# Patient Record
Sex: Female | Born: 1955 | Race: White | Hispanic: No | Marital: Married | Smoking: Never smoker
Health system: Southern US, Community
[De-identification: ages and names within clinical notes are randomized; demographics above are authoritative.]

## PROBLEM LIST (undated history)

## (undated) DIAGNOSIS — B009 Herpesviral infection, unspecified: Secondary | ICD-10-CM

## (undated) DIAGNOSIS — T7840XA Allergy, unspecified, initial encounter: Secondary | ICD-10-CM

## (undated) DIAGNOSIS — M797 Fibromyalgia: Secondary | ICD-10-CM

## (undated) DIAGNOSIS — E079 Disorder of thyroid, unspecified: Secondary | ICD-10-CM

## (undated) DIAGNOSIS — R569 Unspecified convulsions: Secondary | ICD-10-CM

## (undated) HISTORY — PX: OOPHORECTOMY: SHX86

## (undated) HISTORY — PX: APPENDECTOMY: SHX54

## (undated) HISTORY — DX: Herpesviral infection, unspecified: B00.9

## (undated) HISTORY — DX: Allergy, unspecified, initial encounter: T78.40XA

## (undated) HISTORY — PX: DILATION AND CURETTAGE OF UTERUS: SHX78

## (undated) HISTORY — DX: Fibromyalgia: M79.7

---

## 2004-09-08 ENCOUNTER — Emergency Department (HOSPITAL_COMMUNITY): Admission: EM | Admit: 2004-09-08 | Discharge: 2004-09-08 | Payer: Self-pay | Admitting: Emergency Medicine

## 2009-09-04 ENCOUNTER — Ambulatory Visit: Payer: Self-pay | Admitting: Family Medicine

## 2009-09-04 DIAGNOSIS — R569 Unspecified convulsions: Secondary | ICD-10-CM | POA: Insufficient documentation

## 2009-09-04 DIAGNOSIS — E039 Hypothyroidism, unspecified: Secondary | ICD-10-CM | POA: Insufficient documentation

## 2009-09-04 DIAGNOSIS — J069 Acute upper respiratory infection, unspecified: Secondary | ICD-10-CM | POA: Insufficient documentation

## 2009-09-07 ENCOUNTER — Telehealth: Payer: Self-pay | Admitting: Family Medicine

## 2010-08-02 NOTE — Progress Notes (Signed)
  Phone Note Call from Patient   Caller: Patient Reason for Call: Lab or Test Results Summary of Call: Pt cllng for strep results. Pt can be reached at [phone]. Initial call taken by: Areta Haber CMA,  September 07, 2009 4:50 PM    Please notify patient that throat culture was negative. Donna Christen MD  September 07, 2009 5:34 PM   Notified of result Donna Christen MD  September 07, 2009 8:21 PM

## 2010-08-02 NOTE — Assessment & Plan Note (Signed)
Summary: cough/runny nose, sinus pressure x 2 dys rm 3   Vital Signs:  Patient Profile:   55 Years Old Female CC:      Cold & URI symptoms Height:     60 inches Weight:      135 pounds O2 Sat:      98 % O2 treatment:    Room Air Temp:     97.8 degrees F oral Pulse rate:   64 / minute Pulse rhythm:   regular Resp:     16 per minute BP sitting:   116 / 73  (right arm) Cuff size:   regular  Vitals Entered By: Areta Haber CMA (September 04, 2009 2:34 PM)                  Current Allergies: No known allergies History of Present Illness Chief Complaint: Cold & URI symptoms History of Present Illness: Subjective: Patient complains of URI symptoms for 3 days with initial sore throat that is persisting, sinus congestion, and cough. Cough is worse at night No pleuritic pain No wheezing No post-nasal drainage No sinus pain/pressure No itchy/red eyes No earache No hemoptysis No SOB No fever/chills No nausea No vomiting No abdominal pain No diarrhea No skin rashes + fatigue No myalgias No headache Used OTC meds without relief  Current Problems: URI (ICD-465.9) HYPOTHYROIDISM (ICD-244.9) SEIZURE DISORDER (ICD-780.39)   Current Meds LAMICTAL 150 MG TABS (LAMOTRIGINE) 1 tab by mouth once daily LEVOTHYROXINE SODIUM 137 MCG TABS (LEVOTHYROXINE SODIUM) 1 tab by mouth once daily AMOXICILLIN 875 MG TABS (AMOXICILLIN) One by mouth two times a day  REVIEW OF SYSTEMS Constitutional Symptoms      Denies fever, chills, night sweats, weight loss, weight gain, and fatigue.  Eyes       Denies change in vision, eye pain, eye discharge, glasses, contact lenses, and eye surgery. Ear/Nose/Throat/Mouth       Complains of frequent runny nose, sinus problems, and sore throat.      Denies hearing loss/aids, change in hearing, ear pain, ear discharge, dizziness, frequent nose bleeds, hoarseness, and tooth pain or bleeding.      Comments: x yellowish x 2 dys Respiratory  Complains of dry cough.      Denies productive cough, wheezing, shortness of breath, asthma, bronchitis, and emphysema/COPD.  Cardiovascular       Denies murmurs, chest pain, and tires easily with exhertion.    Gastrointestinal       Denies stomach pain, nausea/vomiting, diarrhea, constipation, blood in bowel movements, and indigestion. Genitourniary       Denies painful urination, kidney stones, and loss of urinary control. Neurological       Denies paralysis, seizures, and fainting/blackouts. Musculoskeletal       Denies muscle pain, joint pain, joint stiffness, decreased range of motion, redness, swelling, muscle weakness, and gout.  Skin       Denies bruising, unusual mles/lumps or sores, and hair/skin or nail changes.  Psych       Denies mood changes, temper/anger issues, anxiety/stress, speech problems, depression, and sleep problems.  Past History:  Past Medical History: Seizure disorder Hypothyroidism  Past Surgical History: Ovary surgery Appendectomy Colectomy   Objective:  Appearance:  Patient appears healthy, stated age, and in no acute distress ] Eyes:  Pupils are equal, round, and reactive to light and accomdation.  Extraocular movement is intact.  Conjunctivae are not inflamed.  Ears:  Canals normal.  Tympanic membranes normal.   Nose:  Normal septum.  Normal turbinates, mildly congested.    No sinus tenderness present.  Pharynx:  Mild erythema Neck:  Supple.  No adenopathy is present.  No thyromegaly is present  Lungs:  Clear to auscultation.  Breath sounds are equal.  Heart:  Regular rate and rhythm without murmurs, rubs, or gallops.  Abdomen:  Nontender without masses or hepatosplenomegaly.  Bowel sounds are present.  No CVA or flank tenderness.  Rapid strep test negative  Assessment New Problems: URI (ICD-465.9) HYPOTHYROIDISM (ICD-244.9) SEIZURE DISORDER (ICD-780.39)  VIRAL URI  Plan New Medications/Changes: AMOXICILLIN 875 MG TABS (AMOXICILLIN)  One by mouth two times a day  #20 x 0, 09/04/2009, Donna Christen MD  New Orders: T-Culture, Throat [[id number]] Rapid Strep [02725] New Patient Level III [36644] Planning Comments:   Throat culture pending.  If positive, begin amoxicillin. Treat symptomatically for now:  expectorant, increased fluids, rest.  Add amoxicillin if fever persists 5 to 7 days.   The patient and/or caregiver has been counseled thoroughly with regard to medications prescribed including dosage, schedule, interactions, rationale for use, and possible side effects and they verbalize understanding.  Diagnoses and expected course of recovery discussed and will return if not improved as expected or if the condition worsens. Patient and/or caregiver verbalized understanding.  Prescriptions: AMOXICILLIN 875 MG TABS (AMOXICILLIN) One by mouth two times a day  #20 x 0   Entered and Authorized by:   Donna Christen MD   Signed by:   Donna Christen MD on 09/04/2009   Method used:   Print then Give to Patient   RxID:   [id number]   Laboratory Results  Date/Time Received: September 04, 2009 3:00 PM  Date/Time Reported: September 04, 2009 3:00 PM   Other Tests  Rapid Strep: negative  Kit Test Internal QC: Negative   (Normal Range: Negative)

## 2011-06-26 ENCOUNTER — Encounter: Payer: Self-pay | Admitting: *Deleted

## 2011-06-26 ENCOUNTER — Emergency Department (INDEPENDENT_AMBULATORY_CARE_PROVIDER_SITE_OTHER)
Admission: EM | Admit: 2011-06-26 | Discharge: 2011-06-26 | Disposition: A | Discharge status: Home / Self Care | Payer: Medicare Other | Source: Home / Self Care | Attending: Emergency Medicine | Admitting: Emergency Medicine

## 2011-06-26 DIAGNOSIS — J069 Acute upper respiratory infection, unspecified: Secondary | ICD-10-CM

## 2011-06-26 DIAGNOSIS — J029 Acute pharyngitis, unspecified: Secondary | ICD-10-CM

## 2011-06-26 HISTORY — DX: Disorder of thyroid, unspecified: E07.9

## 2011-06-26 HISTORY — DX: Unspecified convulsions: R56.9

## 2011-06-26 LAB — POCT RAPID STREP A (OFFICE): Rapid Strep A Screen: NEGATIVE

## 2011-06-26 MED ORDER — AMOXICILLIN 500 MG PO CAPS: 500.0000 mg | ORAL_CAPSULE | Freq: Three times a day (TID) | ORAL | Status: AC

## 2011-06-26 MED ORDER — CETIRIZINE HCL 10 MG PO TABS: 10.0000 mg | ORAL_TABLET | Freq: Every day | ORAL | Status: DC

## 2011-06-26 NOTE — ED Notes (Signed)
Patient c/o sore throat, dry cough and drainage x 3 days. Denies fever.

## 2011-06-27 NOTE — ED Provider Notes (Signed)
History     CSN: [id number]  Arrival date & time 06/26/11  1191   First MD Initiated Contact with Patient 06/26/11 [phone]      Chief Complaint  Patient presents with  . Sore Throat     HPI SORE THROAT  Onset: 2-3 days    Severity: moderate Tried OTC meds without significant relief.  Symptoms:  + Fever  + Swollen neck glands    + mild nonproductive Cough with clear rhinorrhea. No Myalgias No Headache No Rash     No Recent Strep Exposure No Abdominal Pain No reflux sxs No Allergy sxs  No Breathing difficulty No Drooling No Trismus   Past Medical History  Diagnosis Date  . Seizures   . Thyroid disease     Past Surgical History  Procedure Date  . Dilation and curettage of uterus     History reviewed. No pertinent family history.  History  Substance Use Topics  . Smoking status: Never Smoker   . Smokeless tobacco: Not on file  . Alcohol Use: No    OB History    Grav Para Term Preterm Abortions TAB SAB Ect Mult Living                  Review of Systems  Constitutional: Positive for fatigue.  Respiratory: Negative for chest tightness, shortness of breath and wheezing.   Cardiovascular: Negative.   Gastrointestinal: Negative.   Skin: Negative.   Hematological: Negative.   Psychiatric/Behavioral: Negative.     Allergies  Corn-containing products and Wheat  Home Medications   Current Outpatient Rx  Name Route Sig Dispense Refill  . ALPRAZOLAM 0.25 MG PO TABS Oral Take 0.25 mg by mouth at bedtime as needed.      [name] LAMOTRIGINE 100 MG PO TABS Oral Take 100 mg by mouth daily.      [name] LEVOTHYROXINE SODIUM 100 MCG PO TABS Oral Take 100 mcg by mouth daily.      . AMOXICILLIN 500 MG PO CAPS Oral Take 1 capsule (500 mg total) by mouth 3 (three) times daily. Take for 10 days. 30 capsule 0  . CETIRIZINE HCL 10 MG PO TABS Oral Take 1 tablet (10 mg total) by mouth daily. 15 tablet 0    BP 127/82  Pulse 53  Temp(Src) 97.7 F (36.5 C) (Oral)  Resp 14   Ht 5' (1.524 m)  Wt 126 lb (57.153 kg)  BMI 24.61 kg/m2  SpO2 100%  Physical Exam  Nursing note and vitals reviewed. Constitutional: She is oriented to person, place, and time. She appears well-developed and well-nourished. She is cooperative.  Non-toxic appearance. No distress.  HENT:  Head: Normocephalic and atraumatic.  Right Ear: Tympanic membrane, external ear and ear canal normal.  Left Ear: Tympanic membrane, external ear and ear canal normal.  Nose: Rhinorrhea (mild clearish serous mucus) present. No epistaxis. Right sinus exhibits no maxillary sinus tenderness and no frontal sinus tenderness. Left sinus exhibits no maxillary sinus tenderness and no frontal sinus tenderness.  Mouth/Throat: Mucous membranes are normal. Posterior oropharyngeal edema and posterior oropharyngeal erythema present. No oropharyngeal exudate.  Eyes: Conjunctivae are normal. No scleral icterus.  Neck: Neck supple.  Cardiovascular: Normal rate, regular rhythm and normal heart sounds.   No murmur heard. Pulmonary/Chest: Effort normal and breath sounds normal. No stridor. No respiratory distress. She has no wheezes. She has no rales.  Musculoskeletal: She exhibits no edema.  Lymphadenopathy:    She has cervical adenopathy.  Right cervical: Superficial cervical adenopathy present. No deep cervical and no posterior cervical adenopathy present.      Left cervical: Superficial cervical adenopathy present. No deep cervical and no posterior cervical adenopathy present.  Neurological: She is alert and oriented to person, place, and time.  Skin: Skin is warm and dry.  Psychiatric: She has a normal mood and affect.    ED Course  Procedures (including critical care time)   Labs Reviewed  POCT RAPID STREP A (OFFICE)   No results found.   1. Pharyngitis   2. URI (upper respiratory infection)       MDM  Rapid strep test negative. I explained the patient that she most likely has viral pharyngitis.  Symptomatic care discussed. Zyrtec for sinus drainage. If no better in two days, or if worsening bacterial symptoms, start taking the amoxicillin as prescribed. Options discussed. Risks, benefits, alternatives discussed. Patient voiced understanding and agreement.        Lonell Face, MD 06/27/11 [phone]

## 2015-05-05 ENCOUNTER — Ambulatory Visit (INDEPENDENT_AMBULATORY_CARE_PROVIDER_SITE_OTHER): Payer: Commercial Managed Care - HMO | Admitting: Physician Assistant

## 2015-05-05 ENCOUNTER — Encounter: Payer: Self-pay | Admitting: Physician Assistant

## 2015-05-05 VITALS — BP 123/69 | HR 90 | Ht 60.0 in | Wt 137.0 lb

## 2015-05-05 DIAGNOSIS — Z79899 Other long term (current) drug therapy: Secondary | ICD-10-CM

## 2015-05-05 DIAGNOSIS — G8929 Other chronic pain: Secondary | ICD-10-CM | POA: Insufficient documentation

## 2015-05-05 DIAGNOSIS — M159 Polyosteoarthritis, unspecified: Secondary | ICD-10-CM

## 2015-05-05 DIAGNOSIS — Z1159 Encounter for screening for other viral diseases: Secondary | ICD-10-CM

## 2015-05-05 DIAGNOSIS — M8949 Other hypertrophic osteoarthropathy, multiple sites: Secondary | ICD-10-CM

## 2015-05-05 DIAGNOSIS — E039 Hypothyroidism, unspecified: Secondary | ICD-10-CM | POA: Insufficient documentation

## 2015-05-05 DIAGNOSIS — M797 Fibromyalgia: Secondary | ICD-10-CM | POA: Insufficient documentation

## 2015-05-05 DIAGNOSIS — M150 Primary generalized (osteo)arthritis: Secondary | ICD-10-CM | POA: Diagnosis not present

## 2015-05-05 DIAGNOSIS — M542 Cervicalgia: Secondary | ICD-10-CM

## 2015-05-05 LAB — TSH: TSH: 2.031 u[IU]/mL (ref 0.350–4.500)

## 2015-05-05 LAB — COMPLETE METABOLIC PANEL WITH GFR
ALT: 34 U/L — ABNORMAL HIGH (ref 6–29)
AST: 34 U/L (ref 10–35)
Albumin: 4.4 g/dL (ref 3.6–5.1)
Alkaline Phosphatase: 71 U/L (ref 33–130)
BUN: 18 mg/dL (ref 7–25)
CHLORIDE: 102 mmol/L (ref 98–110)
CO2: 26 mmol/L (ref 20–31)
Calcium: 9.3 mg/dL (ref 8.6–10.4)
Creat: 1.02 mg/dL (ref 0.50–1.05)
GFR, EST NON AFRICAN AMERICAN: 61 mL/min (ref >=60)
GFR, Est African American: 71 mL/min (ref >=60)
GLUCOSE: 82 mg/dL (ref 65–99)
POTASSIUM: 4.2 mmol/L (ref 3.5–5.3)
SODIUM: 138 mmol/L (ref 135–146)
TOTAL PROTEIN: 6.6 g/dL (ref 6.1–8.1)
Total Bilirubin: 0.5 mg/dL (ref 0.2–1.2)

## 2015-05-05 MED ORDER — LAMOTRIGINE 150 MG PO TABS: 150.0000 mg | ORAL_TABLET | Freq: Every day | ORAL | Status: DC

## 2015-05-05 NOTE — Progress Notes (Signed)
   Subjective:    Patient ID: Judy Russell, female    DOB: 1955-12-13, 59 y.o.   MRN: [id number]  HPI Pt is a 59 yo female who presents to the clinic to establish care.   She needs refills on medication today.   She also needs referral for chiropractor that she sees now. This has improved fibromyalgia and OA as well as chronic pain.   Pt needs hep C screening. Her husband has hep C. Denies any IV drug use, tattoos or sharing needs. She is sexually active with husband.   .. Active Ambulatory Problems    Diagnosis Date Noted  . HYPOTHYROIDISM 09/04/2009  . URI 09/04/2009  . SEIZURE DISORDER 09/04/2009  . Fibromyalgia 05/05/2015   Resolved Ambulatory Problems    Diagnosis Date Noted  . No Resolved Ambulatory Problems   Past Medical History  Diagnosis Date  . Seizures (HCC)   . Thyroid disease   . Allergy   . HSV-2 (herpes simplex virus 2) infection    .[name]. Social History   Social History  . Marital Status: Single    Spouse Name: N/A  . Number of Children: N/A  . Years of Education: N/A   Occupational History  . Not on file.   Social History Main Topics  . Smoking status: Never Smoker   . Smokeless tobacco: Not on file  . Alcohol Use: No  . Drug Use: No  . Sexual Activity: Not Currently   Other Topics Concern  . Not on file   Social History Narrative   .[name]. Family History  Problem Relation Age of Onset  . Cancer Mother     breast  . Diabetes Mother   . Alcohol abuse Father   . Hypertension Father   . Stroke Father      Review of Systems  All other systems reviewed and are negative.      Objective:   Physical Exam  Constitutional: She is oriented to person, place, and time. She appears well-developed and well-nourished.  HENT:  Head: Normocephalic and atraumatic.  Cardiovascular: Normal rate, regular rhythm and normal heart sounds.   Pulmonary/Chest: Effort normal and breath sounds normal.  Neurological: She is alert and oriented to person, place,  and time.  Psychiatric: She has a normal mood and affect. Her behavior is normal.          Assessment & Plan:  Hypothyroidism- recheck TSH and adjust medications accordingly.   Seizure disorder- controlled. Refilled lamictal for 6 months. CMP ordered.   Fibromyalgia/chronic neck pain/OA of multiple joints- referral made for chiropractor. Discussed tumeric.pt declines any medications to try.  Follow up as needed.    Pt declines flu, tdap, mammogram, HIV and colonoscopy. Pt aware of risk assoicated with not getting screenings.  Hep C screening ordered. Since husband has Hep C and qualifying age range.   Will consider lipid level at CPE.

## 2015-05-06 ENCOUNTER — Ambulatory Visit: Payer: Self-pay | Admitting: Osteopathic Medicine

## 2015-05-06 LAB — HEPATITIS C ANTIBODY: HCV AB: NEGATIVE

## 2015-05-07 ENCOUNTER — Other Ambulatory Visit: Payer: Self-pay | Admitting: *Deleted

## 2015-05-07 MED ORDER — LEVOTHYROXINE SODIUM 100 MCG PO TABS: 100.0000 ug | ORAL_TABLET | Freq: Every day | ORAL | Status: DC

## 2015-07-07 ENCOUNTER — Encounter: Payer: Self-pay | Admitting: Physician Assistant

## 2015-07-07 ENCOUNTER — Ambulatory Visit (INDEPENDENT_AMBULATORY_CARE_PROVIDER_SITE_OTHER): Payer: Commercial Managed Care - HMO | Admitting: Physician Assistant

## 2015-07-07 VITALS — BP 147/58 | HR 58

## 2015-07-07 DIAGNOSIS — A609 Anogenital herpesviral infection, unspecified: Secondary | ICD-10-CM | POA: Diagnosis not present

## 2015-07-07 DIAGNOSIS — A6009 Herpesviral infection of other urogenital tract: Secondary | ICD-10-CM

## 2015-07-07 DIAGNOSIS — N941 Unspecified dyspareunia: Secondary | ICD-10-CM | POA: Diagnosis not present

## 2015-07-07 DIAGNOSIS — R300 Dysuria: Secondary | ICD-10-CM

## 2015-07-07 DIAGNOSIS — L298 Other pruritus: Secondary | ICD-10-CM | POA: Diagnosis not present

## 2015-07-07 DIAGNOSIS — N898 Other specified noninflammatory disorders of vagina: Secondary | ICD-10-CM

## 2015-07-07 LAB — POCT URINALYSIS DIPSTICK
BILIRUBIN UA: NEGATIVE
Glucose, UA: NEGATIVE
KETONES UA: NEGATIVE
Nitrite, UA: NEGATIVE
PH UA: 6.5
Protein, UA: NEGATIVE
Urobilinogen, UA: 0.2

## 2015-07-07 LAB — WET PREP FOR TRICH, YEAST, CLUE
Clue Cells Wet Prep HPF POC: NONE SEEN
Trich, Wet Prep: NONE SEEN
Yeast Wet Prep HPF POC: NONE SEEN

## 2015-07-07 MED ORDER — LEVOTHYROXINE SODIUM 125 MCG PO TABS: 125.0000 ug | ORAL_TABLET | Freq: Every day | ORAL | Status: DC

## 2015-07-07 MED ORDER — ACYCLOVIR 400 MG PO TABS: 400.0000 mg | ORAL_TABLET | Freq: Three times a day (TID) | ORAL | Status: DC

## 2015-07-07 NOTE — Progress Notes (Signed)
   Subjective:    Patient ID: Judy Russell, female    DOB: 30-Jul-1955, 60 y.o.   MRN: [id number]  HPI  Pt is a 60 yo female who presents to the clinic with dysuria, vaginal discharge worsening for last 3 days. She reports she always has burning in some capacity just has been worse recently. Discharge often has an odor. Not tried anything to make better. She has hx of herpes and concerned it could be outbreak. Valtrex makes Judy Russell feel sick and have headache. She does not want to take this for herpes. No fever, chills, abdominal pain, flank pain. She does report sex continues to be painful.    Review of Systems  All other systems reviewed and are negative.      Objective:   Physical Exam  Constitutional: She is oriented to person, place, and time. She appears well-developed and well-nourished.  HENT:  Head: Normocephalic and atraumatic.  Cardiovascular: Normal rate, regular rhythm and normal heart sounds.   Pulmonary/Chest: Effort normal and breath sounds normal.  Abdominal: Soft. Bowel sounds are normal. She exhibits no distension and no mass. There is no tenderness. There is no rebound and no guarding.  Genitourinary:  Erythematous vaginal canal with cluster of 3 ulcerations in superior vaginal canal. Pain with insertion of speculum.  No discharge seen on exam.   Neurological: She is alert and oriented to person, place, and time.  Psychiatric: She has a normal mood and affect. Judy Russell behavior is normal.          Assessment & Plan:  Dysuria/herpes genital- saw herpetic lesions on exam today. Treated with acyclovir for 5 days.   .. Results for orders placed or performed in visit on 07/07/15  POCT Urinalysis Dipstick  Result Value Ref Range   Color, UA yellow    Clarity, UA clear    Glucose, UA negative    Bilirubin, UA negative    Ketones, UA negative    Spec Grav, UA <=1.005    Blood, UA trace-lysed    pH, UA 6.5    Protein, UA negative    Urobilinogen, UA 0.2    Nitrite, UA  negative    Leukocytes, UA moderate (2+) (A) Negative   This could represent infection or herpes. Will culture and wait for results to come back to see if need to add abx.   Dyspareunia- discussed appears to also be some vaginal changes due to menopause. Vaginal estrogen could help this. Will consider once get all results in. Can consider astroglide as well as coconut oil.    Vaginal itching- stat wet prep done today.

## 2015-07-08 ENCOUNTER — Telehealth: Payer: Self-pay | Admitting: *Deleted

## 2015-07-08 NOTE — Telephone Encounter (Signed)
Pt called asking for test results from visit with Jade, I gave her the results from the wet prep but said we are still waiting on the urine culture results.  We will call her once those are in.

## 2015-07-09 LAB — URINE CULTURE: Colony Count: >=100000

## 2015-07-09 MED ORDER — AMOXICILLIN 875 MG PO TABS: 875.0000 mg | ORAL_TABLET | Freq: Two times a day (BID) | ORAL | Status: DC

## 2015-07-09 NOTE — Addendum Note (Signed)
Addended by: Jomarie LongsBREEBACK, Arriyana Rodell L on: 07/09/2015 07:58 AM   Modules accepted: Orders

## 2015-07-19 ENCOUNTER — Ambulatory Visit (INDEPENDENT_AMBULATORY_CARE_PROVIDER_SITE_OTHER): Payer: Commercial Managed Care - HMO | Admitting: Physician Assistant

## 2015-07-19 ENCOUNTER — Encounter: Payer: Self-pay | Admitting: Physician Assistant

## 2015-07-19 VITALS — BP 128/72 | HR 58 | Ht 60.0 in | Wt 138.0 lb

## 2015-07-19 DIAGNOSIS — R300 Dysuria: Secondary | ICD-10-CM | POA: Diagnosis not present

## 2015-07-19 DIAGNOSIS — N941 Unspecified dyspareunia: Secondary | ICD-10-CM | POA: Diagnosis not present

## 2015-07-19 LAB — POCT URINALYSIS DIPSTICK
Bilirubin, UA: NEGATIVE
Blood, UA: NEGATIVE
Glucose, UA: NEGATIVE
Ketones, UA: NEGATIVE
NITRITE UA: NEGATIVE
PH UA: 5.5
PROTEIN UA: NEGATIVE
Spec Grav, UA: >=1.03
Urobilinogen, UA: 0.2

## 2015-07-19 MED ORDER — ESTRADIOL 0.1 MG/GM VA CREA: 1.0000 | TOPICAL_CREAM | Freq: Every day | VAGINAL | Status: DC

## 2015-07-20 ENCOUNTER — Other Ambulatory Visit: Payer: Self-pay | Admitting: Physician Assistant

## 2015-07-20 ENCOUNTER — Encounter: Payer: Self-pay | Admitting: Physician Assistant

## 2015-07-20 ENCOUNTER — Telehealth: Payer: Self-pay

## 2015-07-20 MED ORDER — ESTROGENS, CONJUGATED 0.625 MG/GM VA CREA: 1.0000 | TOPICAL_CREAM | Freq: Every day | VAGINAL | Status: DC

## 2015-07-20 NOTE — Telephone Encounter (Signed)
The cost of the estrace cream is too expensive and she would like a cheaper cream. Please advise.

## 2015-07-20 NOTE — Progress Notes (Signed)
   Subjective:    Patient ID: Judy Russell, female    DOB: 1956-01-16, 60 y.o.   MRN: [id number]  HPI  patient is a 60 year old female who presents to the clinic if she has started to feel some warmth burning with urination. She was previously diagnosed with an herpetic exacerbation and treated with acyclovir. She also had group the strap on her urine culture. She was treated with amoxicillin. She has finished both those treatments. She did feel better at one point but now the burning has started back. She denies any abdominal pain or back pain. She denies any fever, nausea or vomiting. She denies any diarrhea.   Review of Systems  All other systems reviewed and are negative.      Objective:   Physical Exam  Constitutional: She is oriented to person, place, and time. She appears well-developed and well-nourished.  HENT:  Head: Normocephalic and atraumatic.  Cardiovascular: Normal rate and normal heart sounds.   Pulmonary/Chest: Effort normal and breath sounds normal.  Negative for CVA tenderness.   Abdominal: Soft. Bowel sounds are normal. She exhibits no distension. There is no tenderness.  Genitourinary:  No vaginal discharge or herpetic lesions. Vagina mucosa was frail and erythematous.   Neurological: She is alert and oriented to person, place, and time.  Psychiatric: She has a normal mood and affect. Her behavior is normal.          Assessment & Plan:  Dysuria- .. Results for orders placed or performed in visit on 07/19/15  POCT urinalysis dipstick  Result Value Ref Range   Color, UA yellow    Clarity, UA clear    Glucose, UA neg    Bilirubin, UA neg    Ketones, UA neg    Spec Grav, UA >=1.030    Blood, UA neg    pH, UA 5.5    Protein, UA neg    Urobilinogen, UA 0.2    Nitrite, UA neg    Leukocytes, UA small (1+) (A) Negative   UA was positive for leukocytes but no other pos findings. wil culture. Did not repeat wet prep since no discharge seen and last wet prep  was negative.  Reassured no herpetic lesions.  I think "burning" could be coming from menopausal changes to vaginal mucosa. Sent estrace once daily for 2 weeks then 3 times a week. Will call with any treatment changes.

## 2015-07-20 NOTE — Telephone Encounter (Signed)
Left message advising of recommendations.  

## 2015-07-20 NOTE — Telephone Encounter (Signed)
I sent premarin to try same directions. One application daily for 2 weeks then switch to 3 times a week.

## 2015-07-21 ENCOUNTER — Other Ambulatory Visit: Payer: Self-pay | Admitting: *Deleted

## 2015-07-21 LAB — URINE CULTURE: Colony Count: 25000

## 2015-11-02 ENCOUNTER — Encounter (INDEPENDENT_AMBULATORY_CARE_PROVIDER_SITE_OTHER): Payer: Medicare HMO | Admitting: Physician Assistant

## 2015-11-02 NOTE — Progress Notes (Signed)
This encounter was created in error - please disregard.  NO SHOW

## 2016-01-13 ENCOUNTER — Other Ambulatory Visit: Payer: Self-pay

## 2016-01-13 MED ORDER — LEVOTHYROXINE SODIUM 125 MCG PO TABS: 125.0000 ug | ORAL_TABLET | Freq: Every day | ORAL | Status: DC

## 2016-02-28 ENCOUNTER — Encounter: Payer: Self-pay | Admitting: Physician Assistant

## 2016-02-28 ENCOUNTER — Ambulatory Visit (INDEPENDENT_AMBULATORY_CARE_PROVIDER_SITE_OTHER): Payer: Commercial Managed Care - HMO | Admitting: Physician Assistant

## 2016-02-28 VITALS — BP 156/72 | HR 57 | Ht 60.0 in | Wt 142.0 lb

## 2016-02-28 DIAGNOSIS — B070 Plantar wart: Secondary | ICD-10-CM | POA: Diagnosis not present

## 2016-02-28 NOTE — Patient Instructions (Signed)
Plantar Warts Warts are small growths on the skin. They can occur on various areas of the body. When they occur on the underside (sole) of the foot, they are called plantar warts. Plantar warts often occur in groups, with several small warts around a larger growth. They tend to develop over areas of pressure, such as the heel or the ball of the foot. Most warts are not painful, and they usually do not cause problems. However, plantar warts may cause pain when you walk because pressure is applied to them. Warts often go away on their own in time. Various treatments may be done if needed. Sometimes, warts go away and then they come back again. CAUSES Plantar warts are caused by a type of virus that is called human papillomavirus (HPV). HPV attacks a break in the skin of the foot. Walking barefoot can lead to exposure to the virus. These warts may spread to other areas of the sole. They spread to other areas of the body only through direct contact. RISK FACTORS Plantar warts are more likely to develop in:  People who are 10-20 years of age.  People who use public showers or locker rooms.  People who have a weakened body defense system (immune system). SYMPTOMS Plantar warts may be flat or slightly raised. They may grow into the deeper layers of skin or rise above the surface of the skin. Most plantar warts have a rough surface. They may cause pain when you use your foot to support your body weight. DIAGNOSIS A plantar wart can usually be diagnosed from its appearance. In some cases, a tissue sample may be removed (biopsy) to be looked at under a microscope. TREATMENT In many cases, warts do not need treatment. Without treatment, they often go away over a period of many months to a couple years. If treatment is needed, options may include:  Applying medicated solutions, creams, or patches to the wart. These may be over-the-counter or prescription medicines that make the skin soft so that layers will  gradually shed away. In many cases, the medicine is applied one or two times per day and covered with a bandage.  Putting duct tape over the top of the wart (occlusion). You will leave the tape in place for as long as told by your health care provider, then you will replace it with a new strip of tape. This is done until the wart goes away.  Freezing the wart with liquid nitrogen (cryotherapy).  Burning the wart with:  Laser treatment.  An electrified probe (electrocautery).  Injection of a medicine (Candida antigen) into the wart to help the body's immune system to fight off the wart.  Surgery to remove the wart. HOME CARE INSTRUCTIONS  Apply medicated creams or solutions only as told by your health care provider. This may involve:  Soaking the affected area in warm water.  Removing the top layer of softened skin before you apply the medicine. A pumice stone works well for removing the tissue.  Applying a bandage over the affected area after you apply the medicine.  Repeating the process daily or as told by your health care provider.  Do not scratch or pick at a wart.  Wash your hands after you touch a wart.  If a wart is painful, try applying a bandage with a hole in the middle over the wart. The helps to take pressure off the wart.  Keep all follow-up visits as told by your health care provider. This is important. PREVENTION   Take these actions to help prevent warts:  Wear shoes and socks. Change your socks daily.  Keep your feet clean and dry.  Check your feet regularly.  Avoid direct contact with warts on other people. SEEK MEDICAL CARE IF:  Your warts do not improve after treatment.  You have redness, swelling, or pain at the site of a wart.  You have bleeding from a wart that does not stop with light pressure.  You have diabetes and you develop a wart.   This information is not intended to replace advice given to you by your health care provider. Make sure  you discuss any questions you have with your health care provider.   Document Released: 09/09/2003 Document Revised: 03/10/2015 Document Reviewed: 09/14/2014 [employer] Interactive Patient Education 2016 [employer]  

## 2016-02-28 NOTE — Progress Notes (Signed)
   Subjective:    Patient ID: Judy Russell, female    DOB: March 30, 1956, 60 y.o.   MRN: [id number]  HPI Pt is a 60 yo female who presents to the clinic with pain on the ball of her left foot for the last month. She is not aware of any trauma. Pain worse with walking. No pain at rest. Not done anything to make better.    Review of Systems See HPI.     Objective:   Physical Exam  Constitutional: She is oriented to person, place, and time. She appears well-developed and well-nourished.  HENT:  Head: Normocephalic and atraumatic.  Cardiovascular: Normal rate, regular rhythm and normal heart sounds.   Musculoskeletal:  NOrmal ROM of left foot.  No pain to palpation over fascia.  No pain with plantar or dorsi flexion.   Neurological: She is alert and oriented to person, place, and time.  Skin:  Ball of left foot down from third toe 1mm wart seed with surrounding hyperkeratotic tissue about 2mm. Tenderness to palpation over wart.   Psychiatric: She has a normal mood and affect. Her behavior is normal.          Assessment & Plan:  Plantar wart of left foot- HO given. Discussed symptomatic care. Consider using OTC wart ring to take pressure off foot. Offered podiatry referral. Will use cryotherapy first.   Cryotherapy Procedure Note  Pre-operative Diagnosis: plantar wart   Post-operative Diagnosis: plantar wart  Locations: ball of left foot  Indications: pain  Procedure Details  History of allergy to iodine: no. Pacemaker? no.  Patient informed of risks (permanent scarring, infection, light or dark discoloration, bleeding, infection, weakness, numbness and recurrence of the lesion) and benefits of the procedure and verbal informed consent obtained.  The areas are treated with liquid nitrogen therapy, frozen until ice ball extended 3 mm beyond lesion, allowed to thaw, and treated again. The patient tolerated procedure well.  The patient was instructed on post-op care, warned that  there may be blister formation, redness and pain. Recommend OTC analgesia as needed for pain.  Condition: Stable  Complications: pain.  Plan: 1. Instructed to keep the area dry and covered for 24-48h and clean thereafter. 2. Warning signs of infection were reviewed.   3. Recommended that the patient use OTC acetaminophen as needed for pain.

## 2016-02-29 DIAGNOSIS — B070 Plantar wart: Secondary | ICD-10-CM | POA: Insufficient documentation

## 2016-05-02 ENCOUNTER — Ambulatory Visit: Payer: Commercial Managed Care - HMO | Admitting: Osteopathic Medicine

## 2016-05-03 ENCOUNTER — Encounter: Payer: Self-pay | Admitting: Sports Medicine

## 2016-05-03 ENCOUNTER — Ambulatory Visit (INDEPENDENT_AMBULATORY_CARE_PROVIDER_SITE_OTHER): Payer: Commercial Managed Care - HMO | Admitting: Sports Medicine

## 2016-05-03 DIAGNOSIS — J209 Acute bronchitis, unspecified: Secondary | ICD-10-CM | POA: Diagnosis not present

## 2016-05-03 MED ORDER — MOMETASONE FUROATE 50 MCG/ACT NA SUSP: NASAL | 6 refills | Status: DC

## 2016-05-03 MED ORDER — BENZONATATE 200 MG PO CAPS: 200.0000 mg | ORAL_CAPSULE | Freq: Three times a day (TID) | ORAL | 0 refills | Status: DC | PRN

## 2016-05-03 MED ORDER — HYDROCOD POLST-CPM POLST ER 10-8 MG/5ML PO SUER: 5.0000 mL | Freq: Two times a day (BID) | ORAL | 0 refills | Status: DC | PRN

## 2016-05-03 MED ORDER — AZELASTINE HCL 0.1 % NA SOLN: 2.0000 | Freq: Two times a day (BID) | NASAL | 1 refills | Status: DC

## 2016-05-03 NOTE — Assessment & Plan Note (Addendum)
Nasonex, Tussionex, Tessalon Perles. Over-the-counter analgesics. Return if no better in 2 weeks. Likely viral.  Nasonex not covered, switching to Astelin

## 2016-05-03 NOTE — Progress Notes (Signed)
  Subjective:    CC: Feeling sick  HPI: This is a pleasant 60 year old female, she comes in with a three-day history of cough, sore throat, runny nose, malaise. No fevers, no GI symptoms, no rash. Symptoms are moderate, persistent. Her husband came home with similar symptoms. Cough is nonproductive, no shortness of breath.  Past medical history:  Negative.  See flowsheet/record as well for more information.  Surgical history: Negative.  See flowsheet/record as well for more information.  Family history: Negative.  See flowsheet/record as well for more information.  Social history: Negative.  See flowsheet/record as well for more information.  Allergies, and medications have been entered into the medical record, reviewed, and no changes needed.   Review of Systems: No fevers, chills, night sweats, weight loss, chest pain, or shortness of breath.   Objective:    General: Well Developed, well nourished, and in no acute distress.  Neuro: Alert and oriented x3, extra-ocular muscles intact, sensation grossly intact.  HEENT: Normocephalic, atraumatic, pupils equal round reactive to light, neck supple, no masses, no lymphadenopathy, thyroid nonpalpable. Oropharynx, nasopharynx, ear canals unremarkable. Skin: Warm and dry, no rashes. Cardiac: Regular rate and rhythm, no murmurs rubs or gallops, no lower extremity edema.  Respiratory: Clear to auscultation bilaterally. Not using accessory muscles, speaking in full sentences.  Impression and Recommendations:    Acute bronchitis Nasacort, Tussionex, Tessalon Perles. Over-the-counter analgesics. Return if no better in 2 weeks. Likely viral.

## 2016-05-03 NOTE — Addendum Note (Signed)
Addended by: Monica BectonHEKKEKANDAM, Karliah Kowalchuk J on: 05/03/2016 12:01 PM   Modules accepted: Orders

## 2016-07-05 ENCOUNTER — Other Ambulatory Visit: Payer: Self-pay | Admitting: Physician Assistant

## 2016-07-05 ENCOUNTER — Other Ambulatory Visit: Payer: Self-pay

## 2016-07-05 MED ORDER — LEVOTHYROXINE SODIUM 125 MCG PO TABS: 125.0000 ug | ORAL_TABLET | Freq: Every day | ORAL | 0 refills | Status: DC

## 2016-07-07 ENCOUNTER — Ambulatory Visit: Payer: Commercial Managed Care - HMO | Admitting: Physician Assistant

## 2016-07-21 ENCOUNTER — Encounter: Payer: Self-pay | Admitting: Physician Assistant

## 2016-07-21 ENCOUNTER — Ambulatory Visit (INDEPENDENT_AMBULATORY_CARE_PROVIDER_SITE_OTHER): Payer: Medicare Other

## 2016-07-21 ENCOUNTER — Ambulatory Visit (INDEPENDENT_AMBULATORY_CARE_PROVIDER_SITE_OTHER): Payer: Medicare Other | Admitting: Physician Assistant

## 2016-07-21 VITALS — BP 131/80 | HR 58 | Ht 60.0 in | Wt 142.0 lb

## 2016-07-21 DIAGNOSIS — M4802 Spinal stenosis, cervical region: Secondary | ICD-10-CM | POA: Diagnosis not present

## 2016-07-21 DIAGNOSIS — G8929 Other chronic pain: Secondary | ICD-10-CM | POA: Diagnosis not present

## 2016-07-21 DIAGNOSIS — F0630 Mood disorder due to known physiological condition, unspecified: Secondary | ICD-10-CM | POA: Insufficient documentation

## 2016-07-21 DIAGNOSIS — N951 Menopausal and female climacteric states: Secondary | ICD-10-CM | POA: Diagnosis not present

## 2016-07-21 DIAGNOSIS — M542 Cervicalgia: Secondary | ICD-10-CM | POA: Diagnosis not present

## 2016-07-21 DIAGNOSIS — E039 Hypothyroidism, unspecified: Secondary | ICD-10-CM

## 2016-07-21 DIAGNOSIS — M479 Spondylosis, unspecified: Secondary | ICD-10-CM | POA: Diagnosis not present

## 2016-07-21 LAB — COMPLETE METABOLIC PANEL WITH GFR
ALBUMIN: 4.4 g/dL (ref 3.6–5.1)
ALK PHOS: 76 U/L (ref 33–130)
ALT: 29 U/L (ref 6–29)
AST: 36 U/L — ABNORMAL HIGH (ref 10–35)
BUN: 17 mg/dL (ref 7–25)
CALCIUM: 9.7 mg/dL (ref 8.6–10.4)
CHLORIDE: 105 mmol/L (ref 98–110)
CO2: 23 mmol/L (ref 20–31)
CREATININE: 0.97 mg/dL (ref 0.50–1.05)
GFR, EST AFRICAN AMERICAN: 74 mL/min (ref >=60)
GFR, Est Non African American: 65 mL/min (ref >=60)
Glucose, Bld: 85 mg/dL (ref 65–99)
POTASSIUM: 4.1 mmol/L (ref 3.5–5.3)
Sodium: 141 mmol/L (ref 135–146)
Total Bilirubin: 0.5 mg/dL (ref 0.2–1.2)
Total Protein: 6.6 g/dL (ref 6.1–8.1)

## 2016-07-21 LAB — T4, FREE: Free T4: 1.5 ng/dL (ref 0.8–1.8)

## 2016-07-21 LAB — TSH: TSH: 1.38 m[IU]/L

## 2016-07-21 MED ORDER — LAMOTRIGINE 200 MG PO TABS: 200.0000 mg | ORAL_TABLET | Freq: Every day | ORAL | 0 refills | Status: DC

## 2016-07-21 MED ORDER — GABAPENTIN 100 MG PO CAPS: ORAL_CAPSULE | ORAL | 1 refills | Status: DC

## 2016-07-21 MED ORDER — MELOXICAM 15 MG PO TABS: 15.0000 mg | ORAL_TABLET | Freq: Every day | ORAL | 1 refills | Status: DC

## 2016-07-21 NOTE — Progress Notes (Signed)
Call pt: I know you had mentioned sending to chiropractor. Please ask if she needs sent to them. 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  Treatment plan as discussed in office. I would consider seeing our sports medicine providers to see if they think injection could help.

## 2016-07-21 NOTE — Progress Notes (Signed)
   Subjective:    Patient ID: Judy Russell, female    DOB: [birth year], 61 y.o.   MRN: [id number]  HPI    Review of Systems     Objective:   Physical Exam        Assessment & Plan:

## 2016-07-21 NOTE — Patient Instructions (Signed)
Gabapentin capsules or tablets What is this medicine? GABAPENTIN (GA ba pen tin) is used to control partial seizures in adults with epilepsy. It is also used to treat certain types of nerve pain. This medicine may be used for other purposes; ask your health care provider or pharmacist if you have questions. COMMON BRAND NAME(S): Active-PAC with Gabapentin, Gabarone, Neurontin What should I tell my health care provider before I take this medicine? They need to know if you have any of these conditions: -kidney disease -suicidal thoughts, plans, or attempt; a previous suicide attempt by you or a family member -an unusual or allergic reaction to gabapentin, other medicines, foods, dyes, or preservatives -pregnant or trying to get pregnant -breast-feeding How should I use this medicine? Take this medicine by mouth with a glass of water. Follow the directions on the prescription label. You can take it with or without food. If it upsets your stomach, take it with food.Take your medicine at regular intervals. Do not take it more often than directed. Do not stop taking except on your doctor's advice. If you are directed to break the 600 or 800 mg tablets in half as part of your dose, the extra half tablet should be used for the next dose. If you have not used the extra half tablet within 28 days, it should be thrown away. A special MedGuide will be given to you by the pharmacist with each prescription and refill. Be sure to read this information carefully each time. Talk to your pediatrician regarding the use of this medicine in children. Special care may be needed. Overdosage: If you think you have taken too much of this medicine contact a poison control center or emergency room at once. NOTE: This medicine is only for you. Do not share this medicine with others. What if I miss a dose? If you miss a dose, take it as soon as you can. If it is almost time for your next dose, take only that dose. Do not  take double or extra doses. What may interact with this medicine? Do not take this medicine with any of the following medications: -other gabapentin products This medicine may also interact with the following medications: -alcohol -antacids -antihistamines for allergy, cough and cold -certain medicines for anxiety or sleep -certain medicines for depression or psychotic disturbances -homatropine; hydrocodone -naproxen -narcotic medicines (opiates) for pain -phenothiazines like chlorpromazine, mesoridazine, prochlorperazine, thioridazine This list may not describe all possible interactions. Give your health care provider a list of all the medicines, herbs, non-prescription drugs, or dietary supplements you use. Also tell them if you smoke, drink alcohol, or use illegal drugs. Some items may interact with your medicine. What should I watch for while using this medicine? Visit your doctor or health care professional for regular checks on your progress. You may want to keep a record at home of how you feel your condition is responding to treatment. You may want to share this information with your doctor or health care professional at each visit. You should contact your doctor or health care professional if your seizures get worse or if you have any new types of seizures. Do not stop taking this medicine or any of your seizure medicines unless instructed by your doctor or health care professional. Stopping your medicine suddenly can increase your seizures or their severity. Wear a medical identification bracelet or chain if you are taking this medicine for seizures, and carry a card that lists all your medications. You may get drowsy, dizzy,   or have blurred vision. Do not drive, use machinery, or do anything that needs mental alertness until you know how this medicine affects you. To reduce dizzy or fainting spells, do not sit or stand up quickly, especially if you are an older patient. Alcohol can  increase drowsiness and dizziness. Avoid alcoholic drinks. Your mouth may get dry. Chewing sugarless gum or sucking hard candy, and drinking plenty of water will help. The use of this medicine may increase the chance of suicidal thoughts or actions. Pay special attention to how you are responding while on this medicine. Any worsening of mood, or thoughts of suicide or dying should be reported to your health care professional right away. Women who become pregnant while using this medicine may enroll in the [place] Antiepileptic Drug Pregnancy Registry by calling 1-888-233-2334. This registry collects information about the safety of antiepileptic drug use during pregnancy. What side effects may I notice from receiving this medicine? Side effects that you should report to your doctor or health care professional as soon as possible: -allergic reactions like skin rash, itching or hives, swelling of the face, lips, or tongue -worsening of mood, thoughts or actions of suicide or dying Side effects that usually do not require medical attention (report to your doctor or health care professional if they continue or are bothersome): -constipation -difficulty walking or controlling muscle movements -dizziness -nausea -slurred speech -tiredness -tremors -weight gain This list may not describe all possible side effects. Call your doctor for medical advice about side effects. You may report side effects to FDA at 1-800-FDA-1088. Where should I keep my medicine? Keep out of reach of children. This medicine may cause accidental overdose and death if it taken by other adults, children, or pets. Mix any unused medicine with a substance like cat litter or coffee grounds. Then throw the medicine away in a sealed container like a sealed bag or a coffee can with a lid. Do not use the medicine after the expiration date. Store at room temperature between 15 and 30 degrees C (59 and 86 degrees F). NOTE: This  sheet is a summary. It may not cover all possible information. If you have questions about this medicine, talk to your doctor, pharmacist, or health care provider.  2017 Elsevier/Gold Standard (2013-08-15 15:26:50)  

## 2016-07-21 NOTE — Progress Notes (Addendum)
   Subjective:    Patient ID: Judy Russell, female    DOB: [birth year], 61 y.o.   MRN: [id number]  HPI  Patient is a 61yo female who presents for lab work.  Patient reports she has been taking her synthroid 125mcg as prescribed, and she would like her levels checked.    Patient states she has been experiencing increased mood swings.  She states she has been on Lamictal since 2002, which has helped over the years, but she may need a higher dose due to her increased mood swings from menopause.  She states she is also having severe hot flashes that wake her up at night.      Patient also states she has noticed bilateral numbness and tingling in her arms and hands.  Patient states she has had right sided neck arthritis for over 2 years.  She states she sees a chiropractor 2-3 times per week.  She reports she alternates ice and heat and uses ibuprofen for the pain.  She states the pain wakes her up at night.  She is requesting an x-ray.         Review of Systems  Musculoskeletal: Positive for neck pain.  All other systems reviewed and are negative.      Objective:   Physical Exam  Constitutional: She is oriented to person, place, and time. She appears well-developed and well-nourished.  Neck:  Rotation to the right is limited to 75 degrees.    Neurological: She is alert and oriented to person, place, and time.  Psychiatric: She has a normal mood and affect. Her behavior is normal.  Nursing note and vitals reviewed.         Assessment & Plan:  [name].[name].Diagnoses and all orders for this visit:  Chronic neck pain -     DG Cervical Spine Complete; Future -     meloxicam (MOBIC) 15 MG tablet; Take 1 tablet (15 mg total) by mouth daily. -     COMPLETE METABOLIC PANEL WITH GFR -     gabapentin (NEURONTIN) 100 MG capsule; Take one to three tablets at bedtime for chronic neck pain and hot flashes.  Hypothyroidism, unspecified type -     TSH -     T4, free  Menopausal symptoms -     gabapentin  (NEURONTIN) 100 MG capsule; Take one to three tablets at bedtime for chronic neck pain and hot flashes.  Mood disorder in conditions classified elsewhere -     lamoTRIgine (LAMICTAL) 200 MG tablet; Take 1 tablet (200 mg total) by mouth daily.    Discussed with patient the options for hormonal replacement.  Patient denies an interest due to her mother developing breast cancer from HRT.    Patient will be started on gabapentin 100mg  for her neck pain and hot flashes.  Patient warned of potential side effects.  Patient also prescribed mobic to help with neck pain. Xray ordered. mobic given to start daily as needed.   Patient's lamictal dose was increased to 200mg  to help decrease her mood swings.  Patient told to call back if she does not notice any improvement with this dose change. Discussed potentially having to start an adjunct if she does not see improvement. Follow up in 6 weeks.   Patient's TSH, free T4 will be checked today.  A CMP with GFR also ordered to screen for diabetes.   Patient can follow-up as needed.

## 2016-07-24 ENCOUNTER — Other Ambulatory Visit: Payer: Self-pay | Admitting: *Deleted

## 2016-07-24 NOTE — Progress Notes (Signed)
Call pt: thyroid looks great.  Kidney, glucose looks good.

## 2016-07-25 ENCOUNTER — Other Ambulatory Visit: Payer: Self-pay

## 2016-07-25 DIAGNOSIS — F0630 Mood disorder due to known physiological condition, unspecified: Secondary | ICD-10-CM

## 2016-07-25 MED ORDER — LEVOTHYROXINE SODIUM 125 MCG PO TABS: 125.0000 ug | ORAL_TABLET | Freq: Every day | ORAL | 3 refills | Status: DC

## 2016-07-25 MED ORDER — LAMOTRIGINE 200 MG PO TABS: 200.0000 mg | ORAL_TABLET | Freq: Every day | ORAL | 3 refills | Status: DC

## 2016-09-04 ENCOUNTER — Ambulatory Visit (INDEPENDENT_AMBULATORY_CARE_PROVIDER_SITE_OTHER): Payer: Medicare Other | Admitting: Physician Assistant

## 2016-09-04 ENCOUNTER — Encounter: Payer: Self-pay | Admitting: Physician Assistant

## 2016-09-04 ENCOUNTER — Ambulatory Visit (INDEPENDENT_AMBULATORY_CARE_PROVIDER_SITE_OTHER): Payer: Medicare Other

## 2016-09-04 VITALS — BP 148/86 | HR 69 | Ht 60.0 in | Wt 143.0 lb

## 2016-09-04 DIAGNOSIS — G8929 Other chronic pain: Secondary | ICD-10-CM | POA: Diagnosis not present

## 2016-09-04 DIAGNOSIS — M25461 Effusion, right knee: Secondary | ICD-10-CM | POA: Diagnosis not present

## 2016-09-04 DIAGNOSIS — M25561 Pain in right knee: Secondary | ICD-10-CM | POA: Diagnosis not present

## 2016-09-04 DIAGNOSIS — S8991XA Unspecified injury of right lower leg, initial encounter: Secondary | ICD-10-CM | POA: Diagnosis not present

## 2016-09-04 DIAGNOSIS — M542 Cervicalgia: Secondary | ICD-10-CM | POA: Diagnosis not present

## 2016-09-04 MED ORDER — MELOXICAM 15 MG PO TABS: 15.0000 mg | ORAL_TABLET | Freq: Every day | ORAL | 2 refills | Status: DC

## 2016-09-04 NOTE — Progress Notes (Signed)
   Subjective:    Patient ID: Judy Russell, female    DOB: 06-Jan-1956, 61 y.o.   MRN: [id number]  HPI  Pt is a 61 yo female who presents to the clinic for right lateral knee pain only when she kneals down. 2 weeks ago she tripped and fell at the gym on her right knee and elbow. She iced and took ibuprofen. Knee pain improved but when she put pressure on her knee getting something out of dryer pain was 10/10. Denies any right knee weakness or giving out. Taking ibuprofen. mobic helps more but ran out.     Review of Systems  All other systems reviewed and are negative.      Objective:   Physical Exam  Constitutional: She is oriented to person, place, and time. She appears well-developed and well-nourished.  HENT:  Head: Normocephalic and atraumatic.  Musculoskeletal:  Right knee:  NROM without tenderness.  No effusion.  No joint tenderness.  Tenderness just below lateral right knee at tip of tibia and fibula.  No bruising.  Negative mcmurrays and lachman.   Neurological: She is alert and oriented to person, place, and time.  Psychiatric: She has a normal mood and affect. Her behavior is normal.          Assessment & Plan:  [name].[name].Maureen RalphsVivian was seen today for knee pain.  Diagnoses and all orders for this visit:  Knee injury, right, initial encounter -     DG Knee Complete 4 Views Right; Future -     DG Knee Complete 4 Views Right  Chronic neck pain -     meloxicam (MOBIC) 15 MG tablet; Take 1 tablet (15 mg total) by mouth daily.   Reassurance given. Likely bone bruise. Ligaments appear strong and intact. Encouraged icing. Refill sent for mobic. Will get xray to evaluate trauma. Avoid knealing for next 2-4 weeks while heals.

## 2016-09-04 NOTE — Progress Notes (Signed)
Call pt: no fracture. Mild soft tissue swelling to be expected after trauma. Treatment plan same as in office.

## 2016-09-13 ENCOUNTER — Telehealth: Payer: Self-pay | Admitting: *Deleted

## 2016-09-13 NOTE — Telephone Encounter (Signed)
Called to get ok for them to send order for her to have knee brace. Ref# [id number]  Spoke w/[name] and [facility] that she [facility] . They will fax over the order for the pt.Judy PacasBarkley, Raziya Aveni SimpsonLynetta

## 2016-09-15 DIAGNOSIS — M25569 Pain in unspecified knee: Secondary | ICD-10-CM | POA: Diagnosis not present

## 2016-11-01 ENCOUNTER — Encounter: Payer: Self-pay | Admitting: Physician Assistant

## 2016-11-01 ENCOUNTER — Ambulatory Visit (INDEPENDENT_AMBULATORY_CARE_PROVIDER_SITE_OTHER): Payer: Medicare Other | Admitting: Physician Assistant

## 2016-11-01 VITALS — BP 161/83 | HR 62 | Ht 60.0 in | Wt 144.0 lb

## 2016-11-01 DIAGNOSIS — R05 Cough: Secondary | ICD-10-CM

## 2016-11-01 DIAGNOSIS — Z77120 Contact with and (suspected) exposure to mold (toxic): Secondary | ICD-10-CM

## 2016-11-01 DIAGNOSIS — R0982 Postnasal drip: Secondary | ICD-10-CM | POA: Diagnosis not present

## 2016-11-01 DIAGNOSIS — R059 Cough, unspecified: Secondary | ICD-10-CM

## 2016-11-01 MED ORDER — PREDNISONE 50 MG PO TABS: ORAL_TABLET | ORAL | 0 refills | Status: DC

## 2016-11-01 MED ORDER — CHLORPHENIRAMINE MALEATE 4 MG PO TABS: 4.0000 mg | ORAL_TABLET | Freq: Two times a day (BID) | ORAL | 0 refills | Status: DC | PRN

## 2016-11-01 NOTE — Progress Notes (Signed)
   Subjective:    Patient ID: Judy Russell, female    DOB: April 09, 1956, 61 y.o.   MRN: [id number]  HPI Pt is a 61 yo female who presents to the clinic with ST and cough for 3 weeks. She was in [address] visiting family and stayed in a house filled with mold. She feels like this is where the cough and ST came from. She denies any fever, chills, sinus pressure, ear pain, wheezing, SOB. She has been taking cough drops with no improvement.    Review of Systems  All other systems reviewed and are negative.      Objective:   Physical Exam  Constitutional: She is oriented to person, place, and time. She appears well-developed and well-nourished.  HENT:  Head: Normocephalic and atraumatic.  Right Ear: External ear normal.  Left Ear: External ear normal.  Mouth/Throat: No oropharyngeal exudate.  TM's clear bilaterally.  Oropharynx erythematous with PND present.  Bilateral red and swollen nasal turbinates.   Eyes: Conjunctivae are normal. Right eye exhibits no discharge. Left eye exhibits no discharge.  Neck: Normal range of motion. Neck supple.  Cardiovascular: Normal rate, regular rhythm and normal heart sounds.   Pulmonary/Chest: Effort normal and breath sounds normal. She has no wheezes.  Lymphadenopathy:    She has no cervical adenopathy.  Neurological: She is alert and oriented to person, place, and time.  Skin: Skin is dry.  Psychiatric: She has a normal mood and affect. Her behavior is normal.          Assessment & Plan:  Marland KitchenMarland KitchenCana was seen today for sore throat and cough.  Diagnoses and all orders for this visit:  Contact with or exposure to mold -     chlorpheniramine (CHLOR-TRIMETON) 4 MG tablet; Take 1 tablet (4 mg total) by mouth 2 (two) times daily as needed for allergies. -     predniSONE (DELTASONE) 50 MG tablet; Take one tablet for 5 days.  PND (post-nasal drip) -     chlorpheniramine (CHLOR-TRIMETON) 4 MG tablet; Take 1 tablet (4 mg total) by mouth 2 (two)  times daily as needed for allergies. -     predniSONE (DELTASONE) 50 MG tablet; Take one tablet for 5 days.  Cough -     chlorpheniramine (CHLOR-TRIMETON) 4 MG tablet; Take 1 tablet (4 mg total) by mouth 2 (two) times daily as needed for allergies. -     predniSONE (DELTASONE) 50 MG tablet; Take one tablet for 5 days.  follow up if not improving by Friday. Reassured I saw no signs of infection. Consider OTC flonase 2 sprays each nostril. Discussed ibuprofen for ST.

## 2016-11-06 DIAGNOSIS — H524 Presbyopia: Secondary | ICD-10-CM | POA: Diagnosis not present

## 2016-11-06 DIAGNOSIS — H5203 Hypermetropia, bilateral: Secondary | ICD-10-CM | POA: Diagnosis not present

## 2017-03-14 ENCOUNTER — Encounter: Payer: Self-pay | Admitting: Physician Assistant

## 2017-03-14 ENCOUNTER — Ambulatory Visit (INDEPENDENT_AMBULATORY_CARE_PROVIDER_SITE_OTHER): Payer: Medicare Other

## 2017-03-14 ENCOUNTER — Ambulatory Visit (INDEPENDENT_AMBULATORY_CARE_PROVIDER_SITE_OTHER): Payer: Medicare Other | Admitting: Physician Assistant

## 2017-03-14 VITALS — BP 160/72 | HR 41 | Wt 134.0 lb

## 2017-03-14 DIAGNOSIS — M79671 Pain in right foot: Secondary | ICD-10-CM

## 2017-03-14 DIAGNOSIS — M79605 Pain in left leg: Secondary | ICD-10-CM | POA: Diagnosis not present

## 2017-03-14 DIAGNOSIS — M7731 Calcaneal spur, right foot: Secondary | ICD-10-CM

## 2017-03-14 NOTE — Progress Notes (Signed)
   Subjective:    Patient ID: Judy Russell, female    DOB: March 07, 1956, 61 y.o.   MRN: [id number]  HPI  Pt is a 61 yo female who presents to the clinic with right foot pain and left posterior leg pain for last month. It was a little over a month ago that she has started renovation on her home [place]. She does not remember any other injury. She does use tens unit at home on left leg that does help. Her right foot hurts worse when bearing weight.she decescribes pain as achy in right foot and pulling in left anterior leg. Occasionally she has some sharp pain radiating down the back of her left leg.   .. Active Ambulatory Problems    Diagnosis Date Noted  . HYPOTHYROIDISM 09/04/2009  . URI 09/04/2009  . SEIZURE DISORDER 09/04/2009  . Fibromyalgia 05/05/2015  . 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 Thyroid activity decreased 05/05/2015  . Chronic neck pain 05/05/2015  . Dyspareunia in female 07/07/2015  . 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 Plantar wart of left foot 02/29/2016  . Acute bronchitis 05/03/2016  . Menopausal symptoms 07/21/2016  . Mood disorder in conditions classified elsewhere 07/21/2016  . Left leg pain 03/18/2017  . Right foot pain 03/18/2017   Resolved Ambulatory Problems    Diagnosis Date Noted  . No Resolved Ambulatory Problems   Past Medical History:  Diagnosis Date  . Allergy   . Fibromyalgia   . HSV-2 (herpes simplex virus 2) infection   . Seizures (HCC)   . Thyroid disease        Review of Systems See HPI.     Objective:   Physical Exam  Constitutional: She appears well-developed and well-nourished.  Musculoskeletal:  Right foot- positive squeeze test Increased pain with plantar and dorsiflexion.  Tenderness anterior foot below 3rd metatarsal to palpation.  No pain to palpation over plantar fascia.   Left hamstring- tenderness and very tight hamstrings.  ROM limited due to pain to about 40 degress.  Pain with  external ROM of the left hip.           Assessment & Plan:  Judy Russell.Judy Russell.Judy Russell was seen today for left leg pain and right foot pain.  Diagnoses and all orders for this visit:  Right foot pain -     DG Foot Complete Right; Future  Left leg pain   Xray of right foot negative for fracture.  Empirically treated for stress fracture.  Placed in post op shoe for 2 weeks.  NSAID/ice/rest discussed.   Hamstring is very tight. Given exercise to stretch out. Massage encouraged. NSAIDs and heat encouraged.  Follow up in 2 weeks.

## 2017-03-14 NOTE — Progress Notes (Signed)
Call pt: no fracture seen in anterior foot. This can still be the case for stress fractures. Stay in boot for 2 weeks and then we can follow up.  You do have a heel spur but does not correlate to painful area.

## 2017-03-14 NOTE — Patient Instructions (Signed)
Spondylolysis Rehab  Ask your health care provider which exercises are safe for you. Do exercises exactly as told by your health care provider and adjust them as directed. It is normal to feel mild stretching, pulling, tightness, or discomfort as you do these exercises, but you should stop right away if you feel sudden pain or your pain gets worse. Do not begin these exercises until told by your health care provider.  Stretching and range of motion exercises  These exercises warm up your muscles and joints and improve the movement and flexibility of your hips and your back. These exercises may also help to relieve pain, numbness, and tingling.  Exercise A: Single knee to chest    1. Lie on your back on a firm surface with both legs straight.  2. Bend one of your knees. Use your hands to move your knee up toward your chest until you feel a gentle stretch in your lower back and buttock.  ? Hold your leg in this position by holding onto the front of your knee.  ? Keep your other leg as straight as possible.  3. Hold for __________ seconds.  4. Slowly return to the starting position.  5. Repeat this exercise with your other leg.  Repeat __________ times. Complete this exercise __________ times a day.  Exercise B: Hamstring stretch, supine    1. Lie on your back.  2. Hold both ends of a belt or towel as you loop it over the ball of one of your feet. The ball of your foot is on the walking surface, right under your toes.  3. Straighten your knee and slowly pull on the belt to raise your leg.  ? Do not let your knee bend while you do this.  ? Keep your other leg flat on the floor.  ? Raise the leg until you feel a gentle stretch in the back of your knee or thigh.  4. Hold for __________ seconds.  5. If told by your health care provider, repeat this exercise with your other leg.  Repeat __________ times. Complete this exercise __________ times a day.  Strengthening exercises   These exercises build strength and endurance in your back. Endurance is the ability to use your muscles for a long time, even after they get tired.  Exercise C: Pelvic tilt  1. Lie on your back on a firm bed or the floor. Bend your knees and keep your feet flat.  2. Tense your abdominal muscles. Tip your pelvis up toward the ceiling and flatten your lower back into the floor.  ? To help with this exercise, you may place a small towel under your lower back and try to push your back into the towel.  3. Hold for __________ seconds.  4. Let your muscles relax completely before you repeat this exercise.  Repeat __________ times. Complete this exercise __________ times a day.  Exercise D: Abdominal crunch    1. Lie on your back on a firm surface. Bend your knees and keep your feet flat. Cross your arms over your chest.  2. Tuck your chin down toward your chest, without bending your neck.  3. Use your abdominal muscles to lift your upper body off of the ground, straight up into the air.  ? Try to lift yourself until your shoulder blades are off the ground. You may need to work up to this.  ? Keep your lower back on the ground while you crunch upward.  ? Do not   hold your breath.  4. Slowly lower yourself down. Keep your abdominal muscles tense until you are back to the starting position.  Repeat __________ times. Complete this exercise __________ times a day.  Exercise E: Alternating arm and leg raises    1. Get on your hands and knees on a firm surface. If you are on a hard floor, you may want to use padding to cushion your knees, such as an exercise mat.  2. Line up your arms and legs. Your hands should be below your shoulders, and your knees should be below your hips.  3. Lift your left leg behind you. At the same time, raise your right arm and straighten it in front of you.  ? Do not lift your leg higher than your hip.  ? Do not lift your arm higher than your shoulder.  ? Keep your abdominal and back muscles tight.   ? Keep your hips facing the ground.  ? Do not arch your back.  ? Keep your balance carefully, and do not hold your breath.  4. Hold for __________ seconds.  5. Slowly return to the starting position and repeat with your right leg and your left arm.  Repeat __________ times. Complete this exercise __________ times a day.  Posture and body mechanics  Body mechanics refers to the movements and positions of your body while you do your daily activities. Posture is part of body mechanics. Good posture and healthy body mechanics can help to relieve stress in your body's tissues and joints. Good posture means that your spine is in its natural S-curve position (your spine is neutral), your shoulders are pulled back slightly, and your head is not tipped forward. The following are general guidelines for applying improved posture and body mechanics to your everyday activities.  Standing    · When standing, keep your spine neutral and your feet about hip-width apart. Keep a slight bend in your knees. Your ears, shoulders, and hips should line up with each other.  · When you do a task in which you stand in one place for a long time, place one foot up on a stable object that is 2–4 inches (5–10 cm) high, such as a footstool. This helps keep your spine neutral.  Sitting    · When sitting, keep your spine neutral and keep your feet flat on the floor. Use a footrest, if necessary, and keep your thighs parallel to the floor. Avoid rounding your shoulders, and avoid tilting your head forward.  · When working at a desk or a computer, keep your desk at a height where your hands are slightly lower than your elbows. Slide your chair under your desk so you are close enough to maintain good posture.  · When working at a computer, place your monitor at a height where you are looking straight ahead and you do not have to tilt your head forward or downward to look at the screen.  Resting     When lying down and resting, avoid positions that are most painful for you.  · If you have pain with activities such as sitting, bending, stooping, or squatting (flexion-based activities), lie in a position in which your body does not bend very much. For example, avoid curling up on your side with your arms and knees near your chest (fetal position).  · If you have pain with activities such as standing for a long time or reaching with your arms (extension-based activities), lie with your spine in   a neutral position and bend your knees slightly. Try the following positions:  ? Lying on your side with a pillow between your knees.  ? Lying on your back with a pillow under your knees.    Lifting    · When lifting objects, keep your feet at least shoulder-width apart and tighten your abdominal muscles.  · Bend your knees and hips and keep your spine neutral. It is important to lift using the strength of your legs, not your back. Do not lock your knees straight out.  · Always ask for help to lift heavy or awkward objects.  This information is not intended to replace advice given to you by your health care provider. Make sure you discuss any questions you have with your health care provider.  Document Released: 06/19/2005 Document Revised: 02/24/2016 Document Reviewed: 03/30/2015  Elsevier Interactive Patient Education © 2018 [employer]

## 2017-03-18 ENCOUNTER — Encounter: Payer: Self-pay | Admitting: Physician Assistant

## 2017-03-18 DIAGNOSIS — M79671 Pain in right foot: Secondary | ICD-10-CM | POA: Insufficient documentation

## 2017-03-18 DIAGNOSIS — M79605 Pain in left leg: Secondary | ICD-10-CM | POA: Insufficient documentation

## 2017-04-25 ENCOUNTER — Ambulatory Visit (INDEPENDENT_AMBULATORY_CARE_PROVIDER_SITE_OTHER): Payer: Medicare Other | Admitting: Physician Assistant

## 2017-04-25 ENCOUNTER — Encounter: Payer: Self-pay | Admitting: Physician Assistant

## 2017-04-25 VITALS — BP 150/79 | HR 43 | Temp 97.8°F | Ht 60.0 in | Wt 136.0 lb

## 2017-04-25 DIAGNOSIS — J40 Bronchitis, not specified as acute or chronic: Secondary | ICD-10-CM | POA: Diagnosis not present

## 2017-04-25 DIAGNOSIS — J329 Chronic sinusitis, unspecified: Secondary | ICD-10-CM | POA: Diagnosis not present

## 2017-04-25 MED ORDER — ALBUTEROL SULFATE HFA 108 (90 BASE) MCG/ACT IN AERS: 2.0000 | INHALATION_SPRAY | Freq: Four times a day (QID) | RESPIRATORY_TRACT | 0 refills | Status: DC | PRN

## 2017-04-25 MED ORDER — AMOXICILLIN-POT CLAVULANATE 875-125 MG PO TABS: 1.0000 | ORAL_TABLET | Freq: Two times a day (BID) | ORAL | 0 refills | Status: DC

## 2017-04-25 MED ORDER — PREDNISONE 50 MG PO TABS: ORAL_TABLET | ORAL | 0 refills | Status: DC

## 2017-04-25 NOTE — Patient Instructions (Signed)

## 2017-04-26 ENCOUNTER — Encounter: Payer: Self-pay | Admitting: Physician Assistant

## 2017-04-26 NOTE — Progress Notes (Signed)
   Subjective:    Patient ID: Judy Russell, female    DOB: 12/02/1955, 61 y.o.   MRN: [id number]  HPI Pt is a 61 yo pleasant female who presents to the clinic with almost 2 weeks of cough, SOB, chest tightness, sinus pressure, ear popping, sinus drainage. She has tried mucinex, nyquil, zyrtec OTC with little relief. Pt is allergic to wood/trees and her neighbor is using his wood stove and the smoke flocks to her home. She denies any fever, chills. This seems to happen every year the first couple of weeks this happens.   .. Active Ambulatory Problems    Diagnosis Date Noted  . HYPOTHYROIDISM 09/04/2009  . URI 09/04/2009  . SEIZURE DISORDER 09/04/2009  . Fibromyalgia 05/05/2015  . 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 Thyroid activity decreased 05/05/2015  . Chronic neck pain 05/05/2015  . Dyspareunia in female 07/07/2015  . 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 Plantar wart of left foot 02/29/2016  . Acute bronchitis 05/03/2016  . Menopausal symptoms 07/21/2016  . Mood disorder in conditions classified elsewhere 07/21/2016  . Left leg pain 03/18/2017  . Right foot pain 03/18/2017   Resolved Ambulatory Problems    Diagnosis Date Noted  . No Resolved Ambulatory Problems   Past Medical History:  Diagnosis Date  . Allergy   . Fibromyalgia   . HSV-2 (herpes simplex virus 2) infection   . Seizures (HCC)   . Thyroid disease       Review of Systems  All other systems reviewed and are negative.      Objective:   Physical Exam  Constitutional: She is oriented to person, place, and time. She appears well-developed and well-nourished.  HENT:  Head: Normocephalic and atraumatic.  Right Ear: External ear normal.  Left Ear: External ear normal.  TM's erythematous. Light reflex present.  Oropharynx erythematous with some PND. Tonsils not present without exudate.  Tenderness over maxillary and frontal sinuses to palpation.  Bilateral nasal turbinates  red and swollen.   Eyes:  Watery injected bilateral conjuctiva.   Neck: Normal range of motion. Neck supple. No thyromegaly present.  Cardiovascular: Normal rate, regular rhythm and normal heart sounds.   Pulmonary/Chest:  Cough with deep breaths. Rhonchi with scattered expiratory wheezing, bilaterally.   Lymphadenopathy:    She has cervical adenopathy.  Neurological: She is alert and oriented to person, place, and time.  Psychiatric: She has a normal mood and affect. Her behavior is normal.          Assessment & Plan:  [name].[name].Maureen RalphsVivian was seen today for sore throat, headache and cough.  Diagnoses and all orders for this visit:  Sinobronchitis -     amoxicillin-clavulanate (AUGMENTIN) 875-125 MG tablet; Take 1 tablet by mouth 2 (two) times daily. -     predniSONE (DELTASONE) 50 MG tablet; Take one tablet for 5 days. -     albuterol (PROVENTIL HFA;VENTOLIN HFA) 108 (90 Base) MCG/ACT inhaler; Inhale 2 puffs into the lungs every 6 (six) hours as needed for wheezing or shortness of breath.   Discussed with patient symptomatic care. obvisouly she cannot avoid her trigger but stay on zyrtec through the entire winter months. Follow up as needed.

## 2017-05-29 ENCOUNTER — Other Ambulatory Visit: Payer: Self-pay | Admitting: *Deleted

## 2017-05-29 ENCOUNTER — Other Ambulatory Visit: Payer: Self-pay | Admitting: Physician Assistant

## 2017-05-29 DIAGNOSIS — F0630 Mood disorder due to known physiological condition, unspecified: Secondary | ICD-10-CM

## 2017-06-20 ENCOUNTER — Telehealth: Payer: Self-pay | Admitting: Physician Assistant

## 2017-06-20 NOTE — Telephone Encounter (Addendum)
Pt left vm this morning. She wants a letter to get her out of jury duty due to her disability. Patient needs letter before January 6th.

## 2017-06-21 NOTE — Telephone Encounter (Signed)
Thank you :)

## 2017-06-21 NOTE — Telephone Encounter (Signed)
I called and asked patient to provide the Juror number and the date for jury duty. She will call back with the information.

## 2017-06-21 NOTE — Telephone Encounter (Signed)
Juror number: [id number] Date: 1/23

## 2017-06-22 NOTE — Telephone Encounter (Signed)
Patient advised letter is ready for pick up.

## 2017-06-29 NOTE — Telephone Encounter (Signed)
Thank you :)

## 2017-07-09 ENCOUNTER — Encounter: Payer: Self-pay | Admitting: Physician Assistant

## 2017-07-09 ENCOUNTER — Ambulatory Visit (INDEPENDENT_AMBULATORY_CARE_PROVIDER_SITE_OTHER): Payer: Medicare Other | Admitting: Physician Assistant

## 2017-07-09 VITALS — BP 147/80 | HR 53 | Ht 60.0 in | Wt 135.0 lb

## 2017-07-09 DIAGNOSIS — Z13220 Encounter for screening for lipoid disorders: Secondary | ICD-10-CM

## 2017-07-09 DIAGNOSIS — E039 Hypothyroidism, unspecified: Secondary | ICD-10-CM | POA: Diagnosis not present

## 2017-07-09 DIAGNOSIS — R030 Elevated blood-pressure reading, without diagnosis of hypertension: Secondary | ICD-10-CM

## 2017-07-09 DIAGNOSIS — Z131 Encounter for screening for diabetes mellitus: Secondary | ICD-10-CM | POA: Diagnosis not present

## 2017-07-09 DIAGNOSIS — R001 Bradycardia, unspecified: Secondary | ICD-10-CM | POA: Diagnosis not present

## 2017-07-09 DIAGNOSIS — Z0000 Encounter for general adult medical examination without abnormal findings: Secondary | ICD-10-CM

## 2017-07-09 DIAGNOSIS — Z9049 Acquired absence of other specified parts of digestive tract: Secondary | ICD-10-CM | POA: Diagnosis not present

## 2017-07-09 DIAGNOSIS — Z8679 Personal history of other diseases of the circulatory system: Secondary | ICD-10-CM | POA: Insufficient documentation

## 2017-07-09 NOTE — Patient Instructions (Addendum)
Keeping You Healthy  Get These Tests  Blood Pressure- Have your blood pressure checked by your healthcare provider at least once a year.  Normal blood pressure is 120/80.  Weight- Have your body mass index (BMI) calculated to screen for obesity.  BMI is a measure of body fat based on height and weight.  You can calculate your own BMI at www.nhlbisupport.com/bmi/  Cholesterol- Have your cholesterol checked every year.  Diabetes- Have your blood sugar checked every year if you have high blood pressure, high cholesterol, a family history of diabetes or if you are overweight.  Pap Test - Have a pap test every 1 to 5 years if you have been sexually active.  If you are older than 65 and recent pap tests have been normal you may not need additional pap tests.  In addition, if you have had a hysterectomy  for benign disease additional pap tests are not necessary.  Mammogram-Yearly mammograms are essential for early detection of breast cancer  Screening for Colon Cancer- Colonoscopy starting at age 50. Screening may begin sooner depending on your family history and other health conditions.  Follow up colonoscopy as directed by your Gastroenterologist.  Screening for Osteoporosis- Screening begins at age 65 with bone density scanning, sooner if you are at higher risk for developing Osteoporosis.  Get these medicines  Calcium with Vitamin D- Your body requires 1200-1500 mg of Calcium a day and 800-1000 IU of Vitamin D a day.  You can only absorb 500 mg of Calcium at a time therefore Calcium must be taken in 2 or 3 separate doses throughout the day.  Hormones- Hormone therapy has been associated with increased risk for certain cancers and heart disease.  Talk to your healthcare provider about if you need relief from menopausal symptoms.  Aspirin- Ask your healthcare provider about taking Aspirin to prevent Heart Disease and Stroke.  Get these Immuniztions  Flu shot- Every fall  Pneumonia shot-  Once after the age of 65; if you are younger ask your healthcare provider if you need a pneumonia shot.  Tetanus- Every ten years.  Zostavax- Once after the age of 60 to prevent shingles.  Take these steps  Don't smoke- Your healthcare provider can help you quit. For tips on how to quit, ask your healthcare provider or go to www.smokefree.gov or call 1-800 QUIT-NOW.  Be physically active- Exercise 5 days a week for a minimum of 30 minutes.  If you are not already physically active, start slow and gradually work up to 30 minutes of moderate physical activity.  Try walking, dancing, bike riding, swimming, etc.  Eat a healthy diet- Eat a variety of healthy foods such as fruits, vegetables, whole grains, low fat milk, low fat cheeses, yogurt, lean meats, chicken, fish, eggs, dried beans, tofu, etc.  For more information go to www.thenutritionsource.org  Dental visit- Brush and floss teeth twice daily; visit your dentist twice a year.  Eye exam- Visit your Optometrist or Ophthalmologist yearly.  Drink alcohol in moderation- Limit alcohol intake to one drink or less a day.  Never drink and drive.  Depression- Your emotional health is as important as your physical health.  If you're feeling down or losing interest in things you normally enjoy, please talk to your healthcare provider.  Seat Belts- can save your life; always wear one  Smoke/Carbon Monoxide detectors- These detectors need to be installed on the appropriate level of your home.  Replace batteries at least once a year.  Violence- If   anyone is threatening or hurting you, please tell your healthcare provider.  Living Will/ Health care power of attorney- Discuss with your healthcare provider and family.   Bradycardia, Adult Bradycardia is a slower-than-normal heartbeat. A normal resting heart rate for an adult ranges from 60 to 100 beats per minute. With bradycardia, the resting heart rate is less than 60 beats per  minute. Bradycardia can prevent enough oxygen from reaching certain areas of your body when you are active. It can be serious if it keeps enough oxygen from reaching your brain and other parts of your body. Bradycardia is not a problem for everyone. For some healthy adults, a slow resting heart rate is normal. What are the causes? This condition may be caused by:  A problem with the heart, including: ? A problem with the heart's electrical system, such as a heart block. ? A problem with the heart's natural pacemaker (sinus node). ? Heart disease. ? A heart attack. ? Heart damage. ? A heart infection. ? A heart condition that is present at birth (congenital heart defect).  Certain medicines that treat heart conditions.  Certain conditions, such as hypothyroidism and obstructive sleep apnea.  Problems with the balance of chemicals and other substances, like potassium, in the blood.  What increases the risk? This condition is more likely to develop in adults who:  Are age 60 or older.  Have high blood pressure (hypertension), high cholesterol (hyperlipidemia), or diabetes.  Drink heavily, use tobacco or nicotine products, or use drugs.  Are stressed.  What are the signs or symptoms? Symptoms of this condition include:  Light-headedness.  Feeling faint or fainting.  Fatigue and weakness.  Shortness of breath.  Chest pain (angina).  Drowsiness.  Confusion.  Dizziness.  How is this diagnosed? This condition may be diagnosed based on:  Your symptoms.  Your medical history.  A physical exam.  During the exam, your health care provider will listen to your heartbeat and check your pulse. To confirm the diagnosis, your health care provider may order tests, such as:  Blood tests.  An electrocardiogram (ECG). This test records the heart's electrical activity. The test can show how fast your heart is beating and whether the heartbeat is steady.  A test in which you  wear a portable device (event recorder or Holter monitor) to record your heart's electrical activity while you go about your day.  Anexercise test.  How is this treated? Treatment for this condition depends on the cause of the condition and how severe your symptoms are. Treatment may involve:  Treatment of the underlying condition.  Changing your medicines or how much medicine you take.  Having a small, battery-operated device called a pacemaker implanted under the skin. When bradycardia occurs, this device can be used to increase your heart rate and help your heart to beat in a regular rhythm.  Follow these instructions at home: Lifestyle   Manage any health conditions that contribute to bradycardia as told by your health care provider.  Follow a heart-healthy diet. A nutrition specialist (dietitian) can help to educate you about healthy food options and changes.  Follow an exercise program that is approved by your health care provider.  Maintain a healthy weight.  Try to reduce or manage your stress, such as with yoga or meditation. If you need help reducing stress, ask your health care provider.  Do not use use any products that contain nicotine or tobacco, such as cigarettes and e-cigarettes. If you need help quitting, ask  your health care provider.  Do not use illegal drugs.  Limit alcohol intake to no more than 1 drink per day for nonpregnant women and 2 drinks per day for men. One drink equals 12 oz of beer, 5 oz of wine, or 1 oz of hard liquor. General instructions  Take over-the-counter and prescription medicines only as told by your health care provider.  Keep all follow-up visits as directed by your health care provider. This is important. How is this prevented? In some cases, bradycardia may be prevented by:  Treating underlying medical problems.  Stopping behaviors or medicines that can trigger the condition.  Contact a health care provider if:  You feel  light-headed or dizzy.  You almost faint.  You feel weak or are easily fatigued during physical activity.  You experience confusion or have memory problems. Get help right away if:  You faint.  You have an irregular heartbeat (palpitations).  You have chest pain.  You have trouble breathing. This information is not intended to replace advice given to you by your health care provider. Make sure you discuss any questions you have with your health care provider. Document Released: 03/11/2002 Document Revised: 02/15/2016 Document Reviewed: 12/09/2015 Elsevier Interactive Patient Education  2017 [employer].

## 2017-07-09 NOTE — Progress Notes (Signed)
Subjective:     Judy Russell is a 62 y.o. female and is here for a comprehensive physical exam. The patient reports no problems.   Social History   Socioeconomic History  . Marital status: Single    Spouse name: Not on file  . Number of children: Not on file  . Years of education: Not on file  . Highest education level: Not on file  Social Needs  . Financial resource strain: Not on file  . Food insecurity - worry: Not on file  . Food insecurity - inability: Not on file  . Transportation needs - medical: Not on file  . Transportation needs - non-medical: Not on file  Occupational History  . Not on file  Tobacco Use  . Smoking status: Never Smoker  . Smokeless tobacco: Never Used  Substance and Sexual Activity  . Alcohol use: No  . Drug use: No  . Sexual activity: Not Currently  Other Topics Concern  . Not on file  Social History Narrative  . Not on file   Health Maintenance  Topic Date Due  . PAP SMEAR  07/11/2017 (Originally 02/15/1979)  . MAMMOGRAM  07/09/2018 (Originally 02/15/2008)  . COLONOSCOPY  07/11/2018 (Originally 02/15/2008)  . INFLUENZA VACCINE  05/04/2025 (Originally 01/31/2017)  . TETANUS/TDAP  05/04/2025 (Originally 02/14/1977)  . HIV Screening  07/03/2058 (Originally 02/14/1973)  . Hepatitis C Screening  Completed    The following portions of the patient's history were reviewed and updated as appropriate: allergies, current medications, past family history, past medical history, past social history, past surgical history and problem list.  Review of Systems Pertinent items noted in HPI and remainder of comprehensive ROS otherwise negative.   Objective:    BP (!) 147/80   Pulse (!) 53   Ht 5' (1.524 m)   Wt 135 lb (61.2 kg)   BMI 26.37 kg/m  General appearance: alert, cooperative and appears stated age Head: Normocephalic, without obvious abnormality, atraumatic Eyes: conjunctivae/corneas clear. PERRL, EOM's intact. Fundi benign. Ears: normal TM's  and external ear canals both ears Nose: Nares normal. Septum midline. Mucosa normal. No drainage or sinus tenderness. Throat: lips, mucosa, and tongue normal; teeth and gums normal Neck: no adenopathy, no carotid bruit, no JVD, supple, symmetrical, trachea midline and thyroid not enlarged, symmetric, no tenderness/mass/nodules Back: symmetric, no curvature. ROM normal. No CVA tenderness. Lungs: clear to auscultation bilaterally Heart: regular rate and rhythm, S1, S2 normal, no murmur, click, rub or gallop Abdomen: soft, non-tender; bowel sounds normal; no masses,  no organomegaly Extremities: extremities normal, atraumatic, no cyanosis or edema Pulses: 2+ and symmetric Skin: Skin color, texture, turgor normal. No rashes or lesions Lymph nodes: Cervical, supraclavicular, and axillary nodes normal. Neurologic: Alert and oriented X 3, normal strength and tone. Normal symmetric reflexes. Normal coordination and gait    Assessment:    Healthy female exam.      Plan:    Judy KitchenMarland KitchenLeialoha was seen today for annual exam.  Diagnoses and all orders for this visit:  Routine physical examination -     Lipid Panel w/reflex Direct LDL -     COMPLETE METABOLIC PANEL WITH GFR -     TSH -     CBC  History of colon resection  Acquired hypothyroidism -     TSH  History of irregular heartbeat -     EKG 12-Lead  Screening for lipid disorders -     Lipid Panel w/reflex Direct LDL  Screening for diabetes mellitus -  COMPLETE METABOLIC PANEL WITH GFR  Sinus bradycardia by electrocardiogram  Elevated blood pressure reading   .Judy Russell. Depression screen PHQ 2/9 07/09/2017  Decreased Interest 0  Down, Depressed, Hopeless 0  PHQ - 2 Score 0   .. Discussed 150 minutes of exercise a week.  Encouraged vitamin D 1000 units and Calcium 1300mg  or 4 servings of dairy a day.  Pt declined all vaccines, colonoscopy, mammogram, pap smear. Pt is aware of risk associated with not being compliant with screenings.   Fasting labs ordered.   EKG confirmed sinus bradycardia. Pt has hx of slow HR. Pt is asymptomatic. Will continue to monitor.   Pt has had a few BP readings elevated. Pt encouraged to start checking at home and recheck in office in 2 to 4 weeks. Discussed low salt diet.   See After Visit Summary for Counseling Recommendations

## 2017-07-10 LAB — COMPLETE METABOLIC PANEL WITH GFR
AG Ratio: 2 (calc) (ref 1.0–2.5)
ALKALINE PHOSPHATASE (APISO): 74 U/L (ref 33–130)
ALT: 20 U/L (ref 6–29)
AST: 28 U/L (ref 10–35)
Albumin: 4.5 g/dL (ref 3.6–5.1)
BUN: 13 mg/dL (ref 7–25)
CALCIUM: 10 mg/dL (ref 8.6–10.4)
CO2: 28 mmol/L (ref 20–32)
CREATININE: 0.96 mg/dL (ref 0.50–1.05)
Chloride: 106 mmol/L (ref 98–110)
GFR, EST NON AFRICAN AMERICAN: 65 mL/min/{1.73_m2} (ref >=60)
GFR, Est African American: 75 mL/min/{1.73_m2} (ref >=60)
GLUCOSE: 84 mg/dL (ref 65–99)
Globulin: 2.3 g/dL (calc) (ref 1.9–3.7)
Potassium: 4.6 mmol/L (ref 3.5–5.3)
Sodium: 139 mmol/L (ref 135–146)
Total Bilirubin: 0.5 mg/dL (ref 0.2–1.2)
Total Protein: 6.8 g/dL (ref 6.1–8.1)

## 2017-07-10 LAB — CBC
HCT: 40.6 % (ref 35.0–45.0)
HEMOGLOBIN: 13.7 g/dL (ref 11.7–15.5)
MCH: 29.2 pg (ref 27.0–33.0)
MCHC: 33.7 g/dL (ref 32.0–36.0)
MCV: 86.6 fL (ref 80.0–100.0)
MPV: 9.3 fL (ref 7.5–12.5)
Platelets: 359 10*3/uL (ref 140–400)
RBC: 4.69 10*6/uL (ref 3.80–5.10)
RDW: 13 % (ref 11.0–15.0)
WBC: 5.3 10*3/uL (ref 3.8–10.8)

## 2017-07-10 LAB — TSH: TSH: 6.93 m[IU]/L — AB (ref 0.40–4.50)

## 2017-07-10 LAB — LIPID PANEL W/REFLEX DIRECT LDL
CHOL/HDL RATIO: 3.7 (calc) (ref <5.0)
CHOLESTEROL: 264 mg/dL — AB (ref <200)
HDL: 72 mg/dL (ref >50)
LDL CHOLESTEROL (CALC): 168 mg/dL — AB
Non-HDL Cholesterol (Calc): 192 mg/dL (calc) — ABNORMAL HIGH (ref <130)
Triglycerides: 109 mg/dL (ref <150)

## 2017-07-11 ENCOUNTER — Encounter: Payer: Self-pay | Admitting: Physician Assistant

## 2017-07-11 DIAGNOSIS — E785 Hyperlipidemia, unspecified: Secondary | ICD-10-CM | POA: Insufficient documentation

## 2017-07-11 DIAGNOSIS — R030 Elevated blood-pressure reading, without diagnosis of hypertension: Secondary | ICD-10-CM | POA: Insufficient documentation

## 2017-07-12 ENCOUNTER — Telehealth: Payer: Self-pay | Admitting: Family Medicine

## 2017-07-12 ENCOUNTER — Ambulatory Visit (INDEPENDENT_AMBULATORY_CARE_PROVIDER_SITE_OTHER): Payer: Medicare Other | Admitting: Family Medicine

## 2017-07-12 ENCOUNTER — Encounter: Payer: Self-pay | Admitting: Family Medicine

## 2017-07-12 ENCOUNTER — Other Ambulatory Visit: Payer: Self-pay | Admitting: Physician Assistant

## 2017-07-12 VITALS — BP 144/90 | HR 65 | Temp 97.8°F | Wt 135.0 lb

## 2017-07-12 DIAGNOSIS — J0101 Acute recurrent maxillary sinusitis: Secondary | ICD-10-CM | POA: Diagnosis not present

## 2017-07-12 MED ORDER — CEFDINIR 300 MG PO CAPS: 300.0000 mg | ORAL_CAPSULE | Freq: Two times a day (BID) | ORAL | 0 refills | Status: DC

## 2017-07-12 MED ORDER — AMOXICILLIN 500 MG PO CAPS: 500.0000 mg | ORAL_CAPSULE | Freq: Three times a day (TID) | ORAL | 0 refills | Status: DC

## 2017-07-12 MED ORDER — FLUCONAZOLE 150 MG PO TABS: 150.0000 mg | ORAL_TABLET | Freq: Once | ORAL | 1 refills | Status: AC

## 2017-07-12 MED ORDER — LEVOTHYROXINE SODIUM 137 MCG PO CAPS: 1.0000 | ORAL_CAPSULE | Freq: Every day | ORAL | 1 refills | Status: DC

## 2017-07-12 NOTE — Telephone Encounter (Signed)
Left detailed message on patient vm advising that Rx has been sent to pharmacy. Andersen Mckiver,CMA

## 2017-07-12 NOTE — Patient Instructions (Addendum)
Thank you for coming in today. Take omnicef for sinus infection.  Take fluconazole if you have a yeast infection.   Recheck if not better.   Return sooner if needed.   Call or go to the emergency room if you get worse, have trouble breathing, have chest pains, or palpitations.    Sinusitis, Adult Sinusitis is soreness and inflammation of your sinuses. Sinuses are hollow spaces in the bones around your face. They are located:  Around your eyes.  In the middle of your forehead.  Behind your nose.  In your cheekbones.  Your sinuses and nasal passages are lined with a stringy fluid (mucus). Mucus normally drains out of your sinuses. When your nasal tissues get inflamed or swollen, the mucus can get trapped or blocked so air cannot flow through your sinuses. This lets bacteria, viruses, and funguses grow, and that leads to infection. Follow these instructions at home: Medicines  Take, use, or apply over-the-counter and prescription medicines only as told by your doctor. These may include nasal sprays.  If you were prescribed an antibiotic medicine, take it as told by your doctor. Do not stop taking the antibiotic even if you start to feel better. Hydrate and Humidify  Drink enough water to keep your pee (urine) clear or pale yellow.  Use a cool mist humidifier to keep the humidity level in your home above 50%.  Breathe in steam for 10-15 minutes, 3-4 times a day or as told by your doctor. You can do this in the bathroom while a hot shower is running.  Try not to spend time in cool or dry air. Rest  Rest as much as possible.  Sleep with your head raised (elevated).  Make sure to get enough sleep each night. General instructions  Put a warm, moist washcloth on your face 3-4 times a day or as told by your doctor. This will help with discomfort.  Wash your hands often with soap and water. If there is no soap and water, use hand sanitizer.  Do not smoke. Avoid being around  people who are smoking (secondhand smoke).  Keep all follow-up visits as told by your doctor. This is important. Contact a doctor if:  You have a fever.  Your symptoms get worse.  Your symptoms do not get better within 10 days. Get help right away if:  You have a very bad headache.  You cannot stop throwing up (vomiting).  You have pain or swelling around your face or eyes.  You have trouble seeing.  You feel confused.  Your neck is stiff.  You have trouble breathing. This information is not intended to replace advice given to you by your health care provider. Make sure you discuss any questions you have with your health care provider. Document Released: 12/06/2007 Document Revised: 02/13/2016 Document Reviewed: 04/14/2015 Elsevier Interactive Patient Education  [employer]2018 Elsevier Inc.

## 2017-07-12 NOTE — Telephone Encounter (Signed)
Will send in amoxicillin

## 2017-07-12 NOTE — Telephone Encounter (Signed)
Patient called was seen today by Dr. Denyse Amassorey and was prescribed med and it is too expensive and she would like to know if Dr. Denyse Amassorey could call in something different that is cheaper or amoxicillin perhaps. Pt request a nurse to call her back today. Thanks

## 2017-07-12 NOTE — Progress Notes (Signed)
Judy Russell is a 62 y.o. female who presents to [facility]: Primary Care Sports Medicine today for sinus pain and pressure. Vi has been sick now for a few weeks and worsening developing bilateral but left worse than right sinus pain and pressure.  Symptoms are consistent with her history of recurrent sinusitis.  She has tried several over-the-counter medications which have not been helpful.  In the past she is done well with antibiotics and would like treatment today if possible.   Past Medical History:  Diagnosis Date  . Allergy   . Fibromyalgia   . HSV-2 (herpes simplex virus 2) infection   . Seizures (HCC)   . Thyroid disease    Past Surgical History:  Procedure Laterality Date  . APPENDECTOMY    . DILATION AND CURETTAGE OF UTERUS    . OOPHORECTOMY Left    Social History   Tobacco Use  . Smoking status: Never Smoker  . Smokeless tobacco: Never Used  Substance Use Topics  . Alcohol use: No   family history includes Alcohol abuse in her father; Cancer in her mother; Diabetes in her mother; Hypertension in her father; Stroke in her father.  ROS as above:  Medications: Current Outpatient Medications  Medication Sig Dispense Refill  . acyclovir (ZOVIRAX) 400 MG tablet     . albuterol (PROVENTIL HFA;VENTOLIN HFA) 108 (90 Base) MCG/ACT inhaler Inhale 2 puffs into the lungs every 6 (six) hours as needed for wheezing or shortness of breath. 1 Inhaler 0  . ALPRAZolam (XANAX) 0.25 MG tablet Take 0.25 mg by mouth at bedtime as needed.      . chlorpheniramine (CHLOR-TRIMETON) 4 MG tablet Take 1 tablet (4 mg total) by mouth 2 (two) times daily as needed for allergies. 14 tablet 0  . conjugated estrogens (PREMARIN) vaginal cream Place 1 Applicatorful vaginally daily. For 2 weeks then decrease to three times a week. 42.5 g 12  . lamoTRIgine (LAMICTAL) 200 MG tablet TAKE 1 TABLET BY MOUTH  DAILY 90  tablet 0  . levothyroxine (SYNTHROID, LEVOTHROID) 125 MCG tablet TAKE 1 TABLET BY MOUTH  DAILY 90 tablet 0  . meloxicam (MOBIC) 15 MG tablet Take 1 tablet (15 mg total) by mouth daily. 90 tablet 2  . cefdinir (OMNICEF) 300 MG capsule Take 1 capsule (300 mg total) by mouth 2 (two) times daily. 14 capsule 0  . cetirizine (ZYRTEC) 10 MG tablet Take 1 tablet (10 mg total) by mouth daily. 15 tablet 0  . fluconazole (DIFLUCAN) 150 MG tablet Take 1 tablet (150 mg total) by mouth once for 1 dose. 1 tablet 1   No current facility-administered medications for this visit.    Allergies  Allergen Reactions  . Corn-Containing Products   . Valtrex [Valacyclovir Hcl]     Headache, nausea  . Wellbutrin [Bupropion]     seizures  . Wheat     Health Maintenance Health Maintenance  Topic Date Due  . PAP SMEAR  02/15/1979  . MAMMOGRAM  07/09/2018 (Originally 02/15/2008)  . COLONOSCOPY  07/11/2018 (Originally 02/15/2008)  . INFLUENZA VACCINE  05/04/2025 (Originally 01/31/2017)  . TETANUS/TDAP  05/04/2025 (Originally 02/14/1977)  . HIV Screening  07/03/2058 (Originally 02/14/1973)  . Hepatitis C Screening  Completed     Exam:  BP (!) 144/90   Pulse 65   Temp 97.8 F (36.6 C) (Oral)   Wt 135 lb (61.2 kg)   BMI 26.37 kg/m  Gen: Well NAD HEENT: EOMI,  MMM  tender to palpation bilateral maxillary sinuses worse on the left.  Clear nasal discharge.  Normal posterior pharynx.  Normal tympanic membranes.  Minimal cervical lymphadenopathy present bilaterally. Lungs: Normal work of breathing. CTABL Heart: RRR no MRG Abd: NABS, Soft. Nondistended, Nontender Exts: Brisk capillary refill, warm and well perfused.     Assessment and Plan: 62 y.o. female with sinusitis likely bacterial due to second sickening.  Plan to treat with Omnicef.  Will prescribe backup fluconazole in case she develops a yeast infection.  Recheck if not improving.   No orders of the defined types were placed in this encounter.  Meds  ordered this encounter  Medications  . cefdinir (OMNICEF) 300 MG capsule    Sig: Take 1 capsule (300 mg total) by mouth 2 (two) times daily.    Dispense:  14 capsule    Refill:  0  . fluconazole (DIFLUCAN) 150 MG tablet    Sig: Take 1 tablet (150 mg total) by mouth once for 1 dose.    Dispense:  1 tablet    Refill:  1     Discussed warning signs or symptoms. Please see discharge instructions. Patient expresses understanding.

## 2017-07-13 ENCOUNTER — Telehealth: Payer: Self-pay | Admitting: Physician Assistant

## 2017-07-13 ENCOUNTER — Other Ambulatory Visit: Payer: Self-pay | Admitting: *Deleted

## 2017-07-13 MED ORDER — LEVOTHYROXINE SODIUM 137 MCG PO TABS: 137.0000 ug | ORAL_TABLET | Freq: Every day | ORAL | 1 refills | Status: DC

## 2017-07-13 NOTE — Telephone Encounter (Signed)
Received fax on 07/13/2017 requesting prior authorization for Tirosint 137 MCG Capsule. Placed fax in prior auth box.

## 2017-07-16 ENCOUNTER — Ambulatory Visit: Payer: Medicare Other | Admitting: Physician Assistant

## 2017-07-17 ENCOUNTER — Ambulatory Visit: Payer: Medicare Other | Admitting: Physician Assistant

## 2017-07-17 ENCOUNTER — Encounter: Payer: Self-pay | Admitting: Physician Assistant

## 2017-07-17 ENCOUNTER — Ambulatory Visit (INDEPENDENT_AMBULATORY_CARE_PROVIDER_SITE_OTHER): Payer: Medicare Other | Admitting: Physician Assistant

## 2017-07-17 VITALS — BP 160/74 | HR 61 | Ht 60.0 in

## 2017-07-17 DIAGNOSIS — T50905A Adverse effect of unspecified drugs, medicaments and biological substances, initial encounter: Secondary | ICD-10-CM

## 2017-07-17 DIAGNOSIS — E039 Hypothyroidism, unspecified: Secondary | ICD-10-CM | POA: Diagnosis not present

## 2017-07-17 DIAGNOSIS — I10 Essential (primary) hypertension: Secondary | ICD-10-CM

## 2017-07-17 DIAGNOSIS — R413 Other amnesia: Secondary | ICD-10-CM

## 2017-07-17 MED ORDER — LISINOPRIL 5 MG PO TABS: 5.0000 mg | ORAL_TABLET | Freq: Every day | ORAL | 1 refills | Status: DC

## 2017-07-17 MED ORDER — LEVOTHYROXINE SODIUM 137 MCG PO TABS: 137.0000 ug | ORAL_TABLET | Freq: Every day | ORAL | 0 refills | Status: DC

## 2017-07-17 NOTE — Progress Notes (Signed)
Subjective:    Patient ID: Judy Russell, female    DOB: [birth year], 62 y.o.   MRN: [id number]  HPI  Pt is a 62 year old female who presents to the clinic with acute symptoms of dizziness, brain fog, memory issues, weakness, nausea that occur suddenly after starting her new dose of levothyroxine. She had been on through mail order and she picked up from local pharmacy after her thyroid levels showed to be hypothyroid. She has felt terrible for last 2 days but today went back to dose and starting to feel better. Although most symptoms were sudden. She has been concerned with her irritable mood, memory changes, and brain fog for the last few months. Her mother had dementia.   .. Active Ambulatory Problems    Diagnosis Date Noted  . Hypothyroidism 09/04/2009  . URI 09/04/2009  . SEIZURE DISORDER 09/04/2009  . Fibromyalgia 05/05/2015  . 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 Thyroid activity decreased 05/05/2015  . Chronic neck pain 05/05/2015  . Dyspareunia in female 07/07/2015  . 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 Plantar wart of left foot 02/29/2016  . Acute bronchitis 05/03/2016  . Menopausal symptoms 07/21/2016  . Mood disorder in conditions classified elsewhere 07/21/2016  . Left leg pain 03/18/2017  . Right foot pain 03/18/2017  . History of colon resection 07/09/2017  . History of irregular heartbeat 07/09/2017  . Sinus bradycardia by electrocardiogram 07/09/2017  . Hyperlipidemia 07/11/2017  . Elevated blood pressure reading 07/11/2017  . Memory changes 07/19/2017  . Essential hypertension 07/19/2017   Resolved Ambulatory Problems    Diagnosis Date Noted  . No Resolved Ambulatory Problems   Past Medical History:  Diagnosis Date  . Allergy   . Fibromyalgia   . HSV-2 (herpes simplex virus 2) infection   . Seizures (HCC)   . Thyroid disease      Review of Systems See HPI.     Objective:   Physical Exam   Constitutional: She is oriented to person, place, and time. She appears well-developed and well-nourished.  HENT:  Head: Normocephalic and atraumatic.  Eyes: Conjunctivae are normal.  Neck: Normal range of motion. Neck supple.  Cardiovascular: Normal rate, regular rhythm and normal heart sounds.  Pulmonary/Chest: Effort normal and breath sounds normal.  Neurological: She is alert and oriented to person, place, and time.  Psychiatric: She has a normal mood and affect. Her behavior is normal.          Assessment & Plan:  Marland KitchenMarland KitchenDiagnoses and all orders for this visit:  Essential hypertension -     lisinopril (PRINIVIL,ZESTRIL) 5 MG tablet; Take 1 tablet (5 mg total) by mouth daily.  Adverse effect of drug, initial encounter  Acquired hypothyroidism -     levothyroxine (SYNTHROID, LEVOTHROID) 137 MCG tablet; Take 1 tablet (137 mcg total) by mouth daily before breakfast.  Memory changes  I suspect she had a reaction to change in manufactor of levothyroxine. Sent new dose to [facility] and placed note to keep same manufactor. Stay on until these come.   BP has been elevated last few times checked. Started low dose lisinopril. Discussed SE"s. Follow up in 2 weeks.   I am not concerned with memory at this time. Screening was normal. Will continue to monitor and see if stress/anxiety could be causing some memory issues. If not improving will get labs.  .. 6CIT Screen 07/19/2017  What Year? 0 points  What month? 0 points  What time? 0 points  Count back from 20 0 points  Months in reverse 0 points  Repeat phrase 2 points  Total Score 2    .[name].Spent 30 minutes with patient and greater than 50 percent of visit spent counseling patient regarding treatment plan.

## 2017-07-19 DIAGNOSIS — R413 Other amnesia: Secondary | ICD-10-CM | POA: Insufficient documentation

## 2017-07-19 DIAGNOSIS — I10 Essential (primary) hypertension: Secondary | ICD-10-CM | POA: Insufficient documentation

## 2017-08-01 ENCOUNTER — Ambulatory Visit: Payer: Medicare Other | Admitting: Physician Assistant

## 2017-08-01 ENCOUNTER — Other Ambulatory Visit: Payer: Self-pay | Admitting: Physician Assistant

## 2017-08-01 DIAGNOSIS — I10 Essential (primary) hypertension: Secondary | ICD-10-CM

## 2017-08-03 ENCOUNTER — Encounter: Payer: Self-pay | Admitting: Physician Assistant

## 2017-08-03 ENCOUNTER — Ambulatory Visit (INDEPENDENT_AMBULATORY_CARE_PROVIDER_SITE_OTHER): Payer: Medicare Other | Admitting: Physician Assistant

## 2017-08-03 VITALS — BP 115/57 | HR 60 | Ht 60.0 in | Wt 131.0 lb

## 2017-08-03 DIAGNOSIS — E039 Hypothyroidism, unspecified: Secondary | ICD-10-CM

## 2017-08-03 DIAGNOSIS — I950 Idiopathic hypotension: Secondary | ICD-10-CM

## 2017-08-03 DIAGNOSIS — R300 Dysuria: Secondary | ICD-10-CM | POA: Diagnosis not present

## 2017-08-03 LAB — POCT URINALYSIS DIPSTICK
Bilirubin, UA: NEGATIVE
Blood, UA: NEGATIVE
Glucose, UA: NEGATIVE
KETONES UA: NEGATIVE
LEUKOCYTES UA: NEGATIVE
NITRITE UA: NEGATIVE
PH UA: 5.5 (ref 5.0–8.0)
PROTEIN UA: NEGATIVE
Spec Grav, UA: >=1.03 — AB (ref 1.010–1.025)
UROBILINOGEN UA: 0.2 U/dL

## 2017-08-03 MED ORDER — LEVOTHYROXINE SODIUM 137 MCG PO TABS: 137.0000 ug | ORAL_TABLET | Freq: Every day | ORAL | 0 refills | Status: DC

## 2017-08-03 NOTE — Patient Instructions (Addendum)
Cut lisinopril 1/2 tablet and recheck in 2 weeks. Keep BP log.  Use estrogen cream over urethra.

## 2017-08-03 NOTE — Progress Notes (Signed)
Subjective:    Patient ID: Judy Russell, female    DOB: [birth year], 62 y.o.   MRN: [id number]  HPI  Pt is a 62 yo female who presents to the clinic to follow up on possible reaction to thyroid medications. She is doing much better since switching back to other manufactor.[facility] will not refill new dose of levothyroxine. She is still on 125mcg.   Pt is having dysuria for the last month or so intermittently. She feels like worse over past few days. No discharge. No vaginal pain. She is not sexually active but she does have a hx of genital herpes. She is on aycylovir for suppression.   She has had a few episode of weakness and sweating. She did check BP once and was 90/70. Recently started on lisinopril 5mg  daily for HTN. No CP, palpitations, headaches, vision changes.   .. Active Ambulatory Problems    Diagnosis Date Noted  . Hypothyroidism 09/04/2009  . URI 09/04/2009  . SEIZURE DISORDER 09/04/2009  . Fibromyalgia 05/05/2015  . 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 Thyroid activity decreased 05/05/2015  . Chronic neck pain 05/05/2015  . Dyspareunia in female 07/07/2015  . 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 Plantar wart of left foot 02/29/2016  . Acute bronchitis 05/03/2016  . Menopausal symptoms 07/21/2016  . Mood disorder in conditions classified elsewhere 07/21/2016  . Left leg pain 03/18/2017  . Right foot pain 03/18/2017  . History of colon resection 07/09/2017  . History of irregular heartbeat 07/09/2017  . Sinus bradycardia by electrocardiogram 07/09/2017  . Hyperlipidemia 07/11/2017  . Elevated blood pressure reading 07/11/2017  . Memory changes 07/19/2017  . Essential hypertension 07/19/2017   Resolved Ambulatory Problems    Diagnosis Date Noted  . No Resolved Ambulatory Problems   Past Medical History:  Diagnosis Date  . Allergy   . Fibromyalgia   . HSV-2 (herpes simplex virus 2) infection   . Seizures (HCC)   . Thyroid disease        Review of Systems  All other systems reviewed and are negative.      Objective:   Physical Exam  Constitutional: She is oriented to person, place, and time. She appears well-developed and well-nourished.  HENT:  Head: Normocephalic and atraumatic.  Cardiovascular: Normal rate, regular rhythm and normal heart sounds.  Pulmonary/Chest: Effort normal and breath sounds normal.  Abdominal: Soft. Bowel sounds are normal. She exhibits no distension and no mass. There is no tenderness. There is no rebound and no guarding.  Genitourinary: Vagina normal and uterus normal. No vaginal discharge found.  Genitourinary Comments: Internal exam showed no ulcerations or evidence of STD.   Neurological: She is alert and oriented to person, place, and time.  Psychiatric: She has a normal mood and affect. Her behavior is normal.          Assessment & Plan:   [name].[name].Maureen RalphsVivian was seen today for hypertension and dysuria.  Diagnoses and all orders for this visit:  Dysuria -     POCT urinalysis dipstick -     Urine Culture  Acquired hypothyroidism -     levothyroxine (SYNTHROID, LEVOTHROID) 137 MCG tablet; Take 1 tablet (137 mcg total) by mouth daily before breakfast.   .. Results for orders placed or performed in visit on 08/03/17  Urine Culture  Result Value Ref Range   MICRO NUMBER: [id number]    SPECIMEN QUALITY: ADEQUATE    Sample Source URINE    STATUS: FINAL  Result:      Three or more organisms present, each greater than 10,000 cu/mL. May represent normal flora contamination from external genitalia. No further testing is required.  POCT urinalysis dipstick  Result Value Ref Range   Color, UA yellow    Clarity, UA clear    Glucose, UA neg    Bilirubin, UA neg    Ketones, UA neg    Spec Grav, UA >=1.030 (A) 1.010 - 1.025   Blood, UA neg    pH, UA 5.5 5.0 - 8.0   Protein, UA neg    Urobilinogen, UA 0.2 0.2 or 1.0 E.U./dL   Nitrite, UA neg    Leukocytes, UA Negative Negative    Appearance     Odor      No sign of UTI today but will culture and change plans as needed. Consider AZO as needed for discomfort. Consider using premarin cream over urethra to prevent UTI.   Reassurance given no ulcers in genital area.   Rewrote dose so that we can recheck thyroid function. Most of her other symptoms have resolved. I think it was the brand causing side effects.   I do think her bP could be dropping too low. Decrease lisinopril to 2.5mg  daily. Keep BP log. Follow up in 2 to 3 weeks.

## 2017-08-05 DIAGNOSIS — I950 Idiopathic hypotension: Secondary | ICD-10-CM | POA: Insufficient documentation

## 2017-08-05 LAB — URINE CULTURE
MICRO NUMBER: 90140370
SPECIMEN QUALITY: ADEQUATE

## 2017-08-06 ENCOUNTER — Other Ambulatory Visit: Payer: Self-pay | Admitting: Physician Assistant

## 2017-08-06 DIAGNOSIS — F0630 Mood disorder due to known physiological condition, unspecified: Secondary | ICD-10-CM

## 2017-08-07 ENCOUNTER — Other Ambulatory Visit: Payer: Self-pay | Admitting: Family Medicine

## 2017-08-13 ENCOUNTER — Ambulatory Visit (INDEPENDENT_AMBULATORY_CARE_PROVIDER_SITE_OTHER): Payer: Medicare Other | Admitting: Sports Medicine

## 2017-08-13 ENCOUNTER — Encounter: Payer: Self-pay | Admitting: Sports Medicine

## 2017-08-13 DIAGNOSIS — J3089 Other allergic rhinitis: Secondary | ICD-10-CM

## 2017-08-13 MED ORDER — MONTELUKAST SODIUM 10 MG PO TABS: 10.0000 mg | ORAL_TABLET | Freq: Every day | ORAL | 3 refills | Status: DC

## 2017-08-13 MED ORDER — DEXAMETHASONE 4 MG PO TABS
4.0000 mg | ORAL_TABLET | Freq: Three times a day (TID) | ORAL | 0 refills | Status: DC
Start: 2017-08-13 — End: 2017-08-20

## 2017-08-13 MED ORDER — LORATADINE 10 MG PO TABS: 10.0000 mg | ORAL_TABLET | Freq: Every day | ORAL | 3 refills | Status: DC

## 2017-08-13 MED ORDER — TRIAMCINOLONE ACETONIDE 55 MCG/ACT NA AERO: 2.0000 | INHALATION_SPRAY | Freq: Every day | NASAL | 12 refills | Status: DC

## 2017-08-13 NOTE — Progress Notes (Signed)
Subjective:    CC: Runny nose  HPI: This is a pleasant 62 year old female, on and off for years she has had chronic runny nose, but acute on chronic worsening during the pollen season.  Really denies any constitutional symptoms, mild cough, no facial pain or pressure.  She is tried some over-the-counter remedies and teas without much improvement.  Has used some Flonase in the past, it does have a moderate efficacy.  I reviewed the past medical history, family history, social history, surgical history, and allergies today and no changes were needed.  Please see the problem list section below in epic for further details.  Past Medical History: Past Medical History:  Diagnosis Date  . Allergy   . Fibromyalgia   . HSV-2 (herpes simplex virus 2) infection   . Seizures (HCC)   . Thyroid disease    Past Surgical History: Past Surgical History:  Procedure Laterality Date  . APPENDECTOMY    . DILATION AND CURETTAGE OF UTERUS    . OOPHORECTOMY Left    Social History: Social History   Socioeconomic History  . Marital status: Single    Spouse name: None  . Number of children: None  . Years of education: None  . Highest education level: None  Social Needs  . Financial resource strain: None  . Food insecurity - worry: None  . Food insecurity - inability: None  . Transportation needs - medical: None  . Transportation needs - non-medical: None  Occupational History  . None  Tobacco Use  . Smoking status: Never Smoker  . Smokeless tobacco: Never Used  Substance and Sexual Activity  . Alcohol use: No  . Drug use: No  . Sexual activity: Not Currently  Other Topics Concern  . None  Social History Narrative  . None   Family History: Family History  Problem Relation Age of Onset  . Cancer Mother        breast  . Diabetes Mother   . Alcohol abuse Father   . Hypertension Father   . Stroke Father    Allergies: Allergies  Allergen Reactions  . Corn-Containing Products   .  Valtrex [Valacyclovir Hcl]     Headache, nausea  . Wellbutrin [Bupropion]     seizures  . Wheat    Medications: See med rec.  Review of Systems: No fevers, chills, night sweats, weight loss, chest pain, or shortness of breath.   Objective:    General: Well Developed, well nourished, and in no acute distress.  Neuro: Alert and oriented x3, extra-ocular muscles intact, sensation grossly intact.  HEENT: Normocephalic, atraumatic, pupils equal round reactive to light, neck supple, no masses, no lymphadenopathy, thyroid nonpalpable.  Oropharynx, nasopharynx, ear canals unremarkable with the exception of minimally boggy and erythematous turbinates.  No tenderness to palpation or percussion over the frontal or maxillary sinuses. Skin: Warm and dry, no rashes. Cardiac: Regular rate and rhythm, no murmurs rubs or gallops, no lower extremity edema.  Respiratory: Clear to auscultation bilaterally. Not using accessory muscles, speaking in full sentences.  Impression and Recommendations:    Perennial allergic rhinitis Adding loratadine, Singulair. Burst of Decadron, patient declines prednisone. Nasacort. She can use the Neti pot. Return to see PCP in 1 month as needed.  I spent 25 minutes with this patient, greater than 50% was face-to-face time counseling regarding the above diagnoses ___________________________________________ Ihor Austinhomas J. Benjamin Stainhekkekandam, M.D., ABFM., CAQSM. Primary Care and Sports Medicine [facility]  Adjunct Instructor of [facility] [employer] [employer]  [employer]

## 2017-08-13 NOTE — Assessment & Plan Note (Addendum)
Adding loratadine, Singulair. Burst of Decadron, patient declines prednisone. Nasacort. She can use the Neti pot. Return to see PCP in 1 month as needed.

## 2017-08-13 NOTE — Patient Instructions (Signed)
Nonallergic Rhinitis Nonallergic rhinitis is a condition that causes symptoms that affect the nose, such as a runny nose and a stuffed-up nose (nasal congestion) that can make it hard to breathe through the nose. This condition is different from having an allergy (allergic rhinitis). Allergic rhinitis occurs when the body's defense system (immune system) reacts to a substance that you are allergic to (allergen), such as pollen, pet dander, mold, or dust. Nonallergic rhinitis has many similar symptoms, but it is not caused by allergens. Nonallergic rhinitis can be a short-term or long-term problem. What are the causes? This condition can be caused by many different things. Some common types of nonallergic rhinitis include: Infectious rhinitis  This is usually due to an infection in the upper respiratory tract. Vasomotor rhinitis  This is the most common type of long-term nonallergic rhinitis.  It is caused by too much blood flow through the nose, which makes the tissue inside of the nose swell.  Symptoms are often triggered by strong odors, cold air, stress, drinking alcohol, cigarette smoke, or changes in the weather. Occupational rhinitis  This type is caused by triggers in the workplace, such as chemicals, dusts, animal dander, or air pollution. Hormonal rhinitis  This type occurs in women as a result of an increase in the female hormone estrogen.  It may occur during pregnancy, puberty, and menstrual cycles.  Symptoms improve when estrogen levels drop. Drug-induced rhinitis Several drugs can cause nonallergic rhinitis, including:  Medicines that are used to treat high blood pressure, heart disease, and Parkinson disease.  Aspirin and NSAIDs.  Over-the-counter nasal decongestant sprays. These can cause a type of nonallergic rhinitis (rhinitis medicamentosa) when they are used for more than a few days.  Nonallergic rhinitis with eosinophilia syndrome (NARES)  This type is caused  by having too much of a certain type of white blood cell (eosinophil). Nonallergic rhinitis can also be caused by a reaction to eating hot or spicy foods. This does not usually cause long-term symptoms. In some cases, the cause of nonallergic rhinitis is not known. What increases the risk? You are more likely to develop this condition if:  You are 30-60 years of age.  You are a woman. Women are twice as likely to have this condition.  What are the signs or symptoms? Common symptoms of this condition include:  Nasal congestion.  Runny nose.  The feeling of mucus going down the back of the throat (postnasal drip).  Trouble sleeping at night and daytime sleepiness.  Less common symptoms include:  Sneezing.  Coughing.  Itchy nose.  Bloodshot eyes.  How is this diagnosed? This condition may be diagnosed based on:  Your symptoms and medical history.  A physical exam.  Allergy testing to rule out allergic rhinitis. You may have skin tests or blood tests.  In some cases, the health care provider may take a swab of nasal secretions to look for an increased number of eosinophils. This would be done to confirm a diagnosis of NARES. How is this treated? Treatment for this condition depends on the cause. No single treatment works for everyone. Work with your health care provider to find the best treatment for you. Treatment may include:  Avoiding the things that trigger your symptoms.  Using medicines to relieve congestion, such as: ? Steroid nasal spray. There are many types. You may need to try a few to find out which one works best. ? Decongestant medicine. This may be an oral medicine or a nasal spray. These   medicines are only used for a short time.  Using medicines to relieve a runny nose. These may include antihistamine medicines or anticholinergic nasal sprays.  Surgery to remove tissue from inside the nose may be needed in severe cases if the condition has not improved  after 6-12 months of medical treatment. Follow these instructions at home:  Take or use over-the-counter and prescription medicines only as told by your health care provider. Do not stop using your medicine even if you start to feel better.  Use salt-water (saline) rinses or other solutions (nasal washes or irrigations) to wash or rinse out the inside of your nose as told by your health care provider.  Do not take NSAIDs or medicines that contain aspirin if they make your symptoms worse.  Do not drink alcohol if it makes your symptoms worse.  Do not use any tobacco products, such as cigarettes, chewing tobacco, and e-cigarettes. If you need help quitting, ask your health care provider.  Avoid secondhand smoke.  Get some exercise every day. Exercise may help reduce symptoms of nonallergic rhinitis for some people. Ask your health care provider how much exercise and what types of exercise are safe for you.  Sleep with the head of your bed raised (elevated). This may reduce nighttime nasal congestion.  Keep all follow-up visits as told by your health care provider. This is important. Contact a health care provider if:  You have a fever.  Your symptoms are getting worse at home.  Your symptoms are not responding to medicine.  You develop new symptoms, especially a headache or nosebleed. This information is not intended to replace advice given to you by your health care provider. Make sure you discuss any questions you have with your health care provider. Document Released: 10/11/2015 Document Revised: 11/25/2015 Document Reviewed: 09/09/2015 Elsevier Interactive Patient Education  2018 [employer]  

## 2017-08-20 ENCOUNTER — Ambulatory Visit (INDEPENDENT_AMBULATORY_CARE_PROVIDER_SITE_OTHER): Payer: Medicare Other | Admitting: Physician Assistant

## 2017-08-20 ENCOUNTER — Encounter: Payer: Self-pay | Admitting: Physician Assistant

## 2017-08-20 VITALS — BP 117/59 | HR 59 | Ht 60.0 in | Wt 133.0 lb

## 2017-08-20 DIAGNOSIS — E039 Hypothyroidism, unspecified: Secondary | ICD-10-CM | POA: Diagnosis not present

## 2017-08-20 DIAGNOSIS — I10 Essential (primary) hypertension: Secondary | ICD-10-CM

## 2017-08-20 DIAGNOSIS — J3089 Other allergic rhinitis: Secondary | ICD-10-CM

## 2017-08-20 MED ORDER — LISINOPRIL 2.5 MG PO TABS: 2.5000 mg | ORAL_TABLET | Freq: Every day | ORAL | 0 refills | Status: DC

## 2017-08-20 NOTE — Progress Notes (Signed)
   Subjective:    Patient ID: Judy Russell, female    DOB: March 12, 1956, 62 y.o.   MRN: [id number]  HPI  Pt is a 62 yo female with hypothyroidism, chronic allergies, recent elevated blood pressure who presents to the clinic to follow up on elevated blood pressure.   She is taking lisinopril 2.5mg  daily. She is doing well. No dizziness, CP, palpitations. Unclear why BP spiked other than thyroid medication changes.   She was not able to tolerate decadron, claritin, singular and stopped them all. Does feel better.   She is taking 137mcg levothyroxine. She is doing very well.   .. Active Ambulatory Problems    Diagnosis Date Noted  . Hypothyroidism 09/04/2009  . URI 09/04/2009  . SEIZURE DISORDER 09/04/2009  . Fibromyalgia 05/05/2015  . 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 Thyroid activity decreased 05/05/2015  . Chronic neck pain 05/05/2015  . Dyspareunia in female 07/07/2015  . 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 Plantar wart of left foot 02/29/2016  . Acute bronchitis 05/03/2016  . Menopausal symptoms 07/21/2016  . Mood disorder in conditions classified elsewhere 07/21/2016  . Left leg pain 03/18/2017  . Right foot pain 03/18/2017  . History of colon resection 07/09/2017  . History of irregular heartbeat 07/09/2017  . Sinus bradycardia by electrocardiogram 07/09/2017  . Hyperlipidemia 07/11/2017  . Elevated blood pressure reading 07/11/2017  . Memory changes 07/19/2017  . Essential hypertension 07/19/2017  . 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 Perennial allergic rhinitis 08/13/2017   Resolved Ambulatory Problems    Diagnosis Date Noted  . No Resolved Ambulatory Problems   Past Medical History:  Diagnosis Date  . Allergy   . Fibromyalgia   . HSV-2 (herpes simplex virus 2) infection   . Seizures (HCC)   . Thyroid disease      Review of Systems  All other systems reviewed and are negative.      Objective:   Physical Exam   Constitutional: She is oriented to person, place, and time. She appears well-developed and well-nourished.  HENT:  Head: Normocephalic and atraumatic.  Eyes: Conjunctivae are normal. Right eye exhibits no discharge. Left eye exhibits no discharge.  Neck: Normal range of motion. Neck supple.  Cardiovascular: Normal rate, regular rhythm and normal heart sounds.  Pulmonary/Chest: Effort normal and breath sounds normal. She has no wheezes.  Lymphadenopathy:    She has no cervical adenopathy.  Neurological: She is alert and oriented to person, place, and time.  Psychiatric: She has a normal mood and affect. Her behavior is normal.          Assessment & Plan:  [name].[name].Judy RalphsVivian was seen today for hypertension.  Diagnoses and all orders for this visit:  Acquired hypothyroidism -     TSH -     T4, free  Essential hypertension -     lisinopril (PRINIVIL,ZESTRIL) 2.5 MG tablet; Take 1 tablet (2.5 mg total) by mouth daily.  Perennial allergic rhinitis   BP looks good today. Stay on 2.5mg . Keep BP log. If trending down will stop in future. Discussed signs of hypotension. Call with any new symptoms.   Pt is tolerating 137mcg dosage with same manufactorer. Recheck TSH/T4 in 6 weeks.   Consider staying on claritin for allergies. Follow up as needed.

## 2017-09-10 ENCOUNTER — Ambulatory Visit (INDEPENDENT_AMBULATORY_CARE_PROVIDER_SITE_OTHER): Payer: Medicare Other | Admitting: Physician Assistant

## 2017-09-10 ENCOUNTER — Encounter: Payer: Self-pay | Admitting: Physician Assistant

## 2017-09-10 VITALS — BP 126/70 | HR 57 | Ht 60.0 in | Wt 134.0 lb

## 2017-09-10 DIAGNOSIS — E782 Mixed hyperlipidemia: Secondary | ICD-10-CM

## 2017-09-10 DIAGNOSIS — I10 Essential (primary) hypertension: Secondary | ICD-10-CM | POA: Diagnosis not present

## 2017-09-10 DIAGNOSIS — J3089 Other allergic rhinitis: Secondary | ICD-10-CM

## 2017-09-10 DIAGNOSIS — E039 Hypothyroidism, unspecified: Secondary | ICD-10-CM | POA: Diagnosis not present

## 2017-09-10 MED ORDER — LISINOPRIL 2.5 MG PO TABS: 2.5000 mg | ORAL_TABLET | Freq: Every day | ORAL | 0 refills | Status: DC

## 2017-09-10 NOTE — Progress Notes (Signed)
   Subjective:    Patient ID: Judy Russell, female    DOB: [birth year], 62 y.o.   MRN: [id number]  HPI Pt is a 62 yo female with HTN, hypothyroidism, perennial allergic rhinitis who presents to the clinic for 1 month follow up.   HTN- no CP, palpitations, headaches, vision changes. Taking 2.5mg  of lisinopril daily with no problems.   Hypothyroidism- on new 137mcg dose for 3 weeks. Doing well. Needs labs.   perinneal allergic rhinitis- flonase daily is helping a lot. She only takes claritin as needed.   .. Active Ambulatory Problems    Diagnosis Date Noted  . Hypothyroidism 09/04/2009  . SEIZURE DISORDER 09/04/2009  . Fibromyalgia 05/05/2015  . 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 Thyroid activity decreased 05/05/2015  . Chronic neck pain 05/05/2015  . Dyspareunia in female 07/07/2015  . 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 Plantar wart of left foot 02/29/2016  . Menopausal symptoms 07/21/2016  . Mood disorder in conditions classified elsewhere 07/21/2016  . Left leg pain 03/18/2017  . Right foot pain 03/18/2017  . History of colon resection 07/09/2017  . History of irregular heartbeat 07/09/2017  . Sinus bradycardia by electrocardiogram 07/09/2017  . Hyperlipidemia 07/11/2017  . Elevated blood pressure reading 07/11/2017  . Memory changes 07/19/2017  . Essential hypertension 07/19/2017  . 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 Perennial allergic rhinitis 08/13/2017   Resolved Ambulatory Problems    Diagnosis Date Noted  . URI 09/04/2009  . Acute bronchitis 05/03/2016   Past Medical History:  Diagnosis Date  . Allergy   . Fibromyalgia   . HSV-2 (herpes simplex virus 2) infection   . Seizures (HCC)   . Thyroid disease       Review of Systems  All other systems reviewed and are negative.      Objective:   Physical Exam  Constitutional: She is oriented to person, place, and time. She appears well-developed and well-nourished.  HENT:   Head: Normocephalic and atraumatic.  Right Ear: External ear normal.  Left Ear: External ear normal.  Nose: Nose normal.  Mouth/Throat: Oropharynx is clear and moist. No oropharyngeal exudate.  Eyes: Conjunctivae are normal.  Neck: Normal range of motion. Neck supple.  Cardiovascular: Normal rate, regular rhythm and normal heart sounds.  Pulmonary/Chest: Effort normal and breath sounds normal. She has no wheezes.  Lymphadenopathy:    She has no cervical adenopathy.  Neurological: She is alert and oriented to person, place, and time.  Psychiatric: She has a normal mood and affect. Her behavior is normal.          Assessment & Plan:  [name].[name].Diagnoses and all orders for this visit:  Acquired hypothyroidism -     TSH -     T4, free  Essential hypertension -     lisinopril (PRINIVIL,ZESTRIL) 2.5 MG tablet; Take 1 tablet (2.5 mg total) by mouth daily.  Perennial allergic rhinitis  Mixed hyperlipidemia   BP great. Sent refill to [facility].   Labs ordered to get done in next 2 weeks. Will call with any adjustments.   Continue flonase and as needed claritin for allergies.   Pt last LDL was elevated she has been on red yeast rice and diet changes. Will check in 3 month-6 months.

## 2017-09-21 ENCOUNTER — Other Ambulatory Visit: Payer: Self-pay | Admitting: Physician Assistant

## 2017-09-21 DIAGNOSIS — E039 Hypothyroidism, unspecified: Secondary | ICD-10-CM

## 2017-10-29 ENCOUNTER — Other Ambulatory Visit: Payer: Self-pay | Admitting: Physician Assistant

## 2017-10-29 DIAGNOSIS — I10 Essential (primary) hypertension: Secondary | ICD-10-CM

## 2017-11-05 ENCOUNTER — Ambulatory Visit (INDEPENDENT_AMBULATORY_CARE_PROVIDER_SITE_OTHER): Payer: Medicare Other | Admitting: Family Medicine

## 2017-11-05 ENCOUNTER — Encounter: Payer: Self-pay | Admitting: Family Medicine

## 2017-11-05 ENCOUNTER — Ambulatory Visit (INDEPENDENT_AMBULATORY_CARE_PROVIDER_SITE_OTHER): Payer: Medicare Other

## 2017-11-05 VITALS — BP 144/77 | HR 54 | Wt 142.0 lb

## 2017-11-05 DIAGNOSIS — M545 Low back pain, unspecified: Secondary | ICD-10-CM

## 2017-11-05 DIAGNOSIS — M5136 Other intervertebral disc degeneration, lumbar region: Secondary | ICD-10-CM

## 2017-11-05 NOTE — Progress Notes (Signed)
Judy Russell is a 62 y.o. female who presents to [facility] today for right low back pain.  Patient notes a 6-week history of right low back pain occurring without injury.  She denies any radiating pain weakness or numbness.  She has the pain is worse with activity and better with rest but is still present when seated and when laying down.  She denies any bowel or bladder dysfunction.  She is tried over-the-counter medications which have helped some.  She is tried chiropractic care which has not helped much.   Past Medical History:  Diagnosis Date  . Allergy   . Fibromyalgia   . HSV-2 (herpes simplex virus 2) infection   . Seizures (HCC)   . Thyroid disease    Past Surgical History:  Procedure Laterality Date  . APPENDECTOMY    . DILATION AND CURETTAGE OF UTERUS    . OOPHORECTOMY Left    Social History   Tobacco Use  . Smoking status: Never Smoker  . Smokeless tobacco: Never Used  Substance Use Topics  . Alcohol use: No     ROS:  As above   Medications: Current Outpatient Medications  Medication Sig Dispense Refill  . ALPRAZolam (XANAX) 0.25 MG tablet Take 0.25 mg by mouth at bedtime as needed.      . fluticasone (FLONASE) 50 MCG/ACT nasal spray Place into both nostrils daily.    [name] lamoTRIgine (LAMICTAL) 200 MG tablet TAKE 1 TABLET BY MOUTH  DAILY 90 tablet 1  . levothyroxine (SYNTHROID, LEVOTHROID) 137 MCG tablet TAKE 1 TABLET BY MOUTH  DAILY BEFORE BREAKFAST 90 tablet 0  . acyclovir (ZOVIRAX) 400 MG tablet     . albuterol (PROVENTIL HFA;VENTOLIN HFA) 108 (90 Base) MCG/ACT inhaler Inhale 2 puffs into the lungs every 6 (six) hours as needed for wheezing or shortness of breath. (Patient not taking: Reported on 11/05/2017) 1 Inhaler 0  . conjugated estrogens (PREMARIN) vaginal cream Place 1 Applicatorful vaginally daily. For 2 weeks then decrease to three times a week. (Patient not taking: Reported on 11/05/2017) 42.5 g 12  .  lisinopril (PRINIVIL,ZESTRIL) 2.5 MG tablet Take 1 tablet (2.5 mg total) by mouth daily. (Patient not taking: Reported on 11/05/2017) 90 tablet 0  . loratadine (CLARITIN) 10 MG tablet Take 1 tablet (10 mg total) by mouth daily. (Patient not taking: Reported on 11/05/2017) 90 tablet 3  . meloxicam (MOBIC) 15 MG tablet Take 1 tablet (15 mg total) by mouth daily. (Patient not taking: Reported on 11/05/2017) 90 tablet 2  . triamcinolone (NASACORT) 55 MCG/ACT AERO nasal inhaler Place 2 sprays into the nose daily. (Patient not taking: Reported on 11/05/2017) 1 Inhaler 12   No current facility-administered medications for this visit.    Allergies  Allergen Reactions  . Corn-Containing Products   . Decadron [Dexamethasone]     Jittery/anxiety/shaking  . Valtrex [Valacyclovir Hcl]     Headache, nausea  . Wellbutrin [Bupropion]     seizures  . Wheat      Exam:  BP (!) 144/77   Pulse (!) 54   Wt 142 lb (64.4 kg)   BMI 27.73 kg/m  General: Well Developed, well nourished, and in no acute distress.  Neuro/Psych: Alert and oriented x3, extra-ocular muscles intact, able to move all 4 extremities, sensation grossly intact. Skin: Warm and dry, no rashes noted.  Respiratory: Not using accessory muscles, speaking in full sentences, trachea midline.  Cardiovascular: Pulses palpable, no extremity edema. Abdomen: Does not appear  distended. MSK:  L-spine: Nontender to midline.  Tender palpation right SI joint area Lumbar motion normal flexion limited extension pain but normal motion with left lateral flexion and rotation. Lower extreme strength and reflexes are equal normal throughout. Sensation is intact throughout. Negative slump test bilaterally.  My personal independent interpretation of lumbar x-ray images: Possible old compression fracture at T12.  Degenerative changes present in the lumbar spine with no acute fractures or malalignment. Awaiting formal radiology review    No results found for this  or any previous visit (from the past 48 hour(s)). No results found.    Assessment and Plan: 62 y.o. female with  Low back pain likely quadratus lumborum strain and dysfunction Plan for physical therapy.  X-ray radiology review pending Recheck in 6 weeks.  Return sooner if needed.    Orders Placed This Encounter  Procedures  . DG Lumbar Spine Complete    Standing Status:   Future    Number of Occurrences:   1    Standing Expiration Date:   01/06/2019    Order Specific Question:   Reason for Exam (SYMPTOM  OR DIAGNOSIS REQUIRED)    Answer:   eval right low back pain    Order Specific Question:   Is patient pregnant?    Answer:   No    Order Specific Question:   Preferred imaging location?    Answer:   [facility]    Order Specific Question:   Radiology Contrast Protocol - do NOT remove file path    Answer:   [email or web address]  . Ambulatory referral to Physical Therapy    Referral Priority:   Routine    Referral Type:   Physical Medicine    Referral Reason:   Specialty Services Required    Requested Specialty:   Physical Therapy   No orders of the defined types were placed in this encounter.   Discussed warning signs or symptoms. Please see discharge instructions. Patient expresses understanding.

## 2017-11-05 NOTE — Patient Instructions (Addendum)
Thank you for coming in today. Get xray now. Schedule PT.  Recheck with me in 6 weeks.  Continue TENS and heating pad as tolerated.  Return sooner if needed.   TENS UNIT: This is helpful for muscle pain and spasm.   Search and Purchase a TENS 7000 2nd edition at  www.tenspros.com or www.Amazon.com It should be less than $30.     TENS unit instructions: Do not shower or bathe with the unit on Turn the unit off before removing electrodes or batteries If the electrodes lose stickiness add a drop of water to the electrodes after they are disconnected from the unit and place on plastic sheet. If you continued to have difficulty, call the TENS unit company to purchase more electrodes. Do not apply lotion on the skin area prior to use. Make sure the skin is clean and dry as this will help prolong the life of the electrodes. After use, always check skin for unusual red areas, rash or other skin difficulties. If there are any skin problems, does not apply electrodes to the same area. Never remove the electrodes from the unit by pulling the wires. Do not use the TENS unit or electrodes other than as directed. Do not change electrode placement without consultating your therapist or physician. Keep 2 fingers with between each electrode. Wear time ratio is 2:1, on to off times.    For example on for 30 minutes off for 15 minutes and then on for 30 minutes off for 15 minutes     Lumbosacral Strain Lumbosacral strain is an injury that causes pain in the lower back (lumbosacral spine). This injury usually occurs from overstretching the muscles or ligaments along your spine. A strain can affect one or more muscles or cord-like tissues that connect bones to other bones (ligaments). What are the causes? This condition may be caused by:  A hard, direct hit (blow) to the back.  Excessive stretching of the lower back muscles. This may result from: ? A fall. ? Lifting something heavy. ? Repetitive  movements such as bending or crouching.  What increases the risk? The following factors may increase your risk of getting this condition:  Participating in sports or activities that involve: ? A sudden twist of the back. ? Pushing or pulling motions.  Being overweight or obese.  Having poor strength and flexibility, especially tight hamstrings or weak muscles in the back or abdomen.  Having too much of a curve in the lower back.  Having a pelvis that is tilted forward.  What are the signs or symptoms? The main symptom of this condition is pain in the lower back, at the site of the strain. Pain may extend (radiate) down one or both legs. How is this diagnosed? This condition is diagnosed based on:  Your symptoms.  Your medical history.  A physical exam. ? Your health care provider may push on certain areas of your back to determine the source of your pain. ? You may be asked to bend forward, backward, and side to side to assess the severity of your pain and your range of motion.  Imaging tests, such as: ? X-rays. ? MRI.  How is this treated? Treatment for this condition may include:  Putting heat and cold on the affected area.  Medicines to help relieve pain and relax your muscles (muscle relaxants).  NSAIDs to help reduce swelling and discomfort.  When your symptoms improve, it is important to gradually return to your normal routine as soon  as possible to reduce pain, avoid stiffness, and avoid loss of muscle strength. Generally, symptoms should improve within 6 weeks of treatment. However, recovery time varies. Follow these instructions at home: Managing pain, stiffness, and swelling   If directed, put ice on the injured area during the first 24 hours after your strain. ? Put ice in a plastic bag. ? Place a towel between your skin and the bag. ? Leave the ice on for 20 minutes, 2-3 times a day.  If directed, put heat on the affected area as often as told by your  health care provider. Use the heat source that your health care provider recommends, such as a moist heat pack or a heating pad. ? Place a towel between your skin and the heat source. ? Leave the heat on for 20-30 minutes. ? Remove the heat if your skin turns bright red. This is especially important if you are unable to feel pain, heat, or cold. You may have a greater risk of getting burned. Activity  Rest and return to your normal activities as told by your health care provider. Ask your health care provider what activities are safe for you.  Avoid activities that take a lot of energy for as long as told by your health care provider. General instructions  Take over-the-counter and prescription medicines only as told by your health care provider.  Donot drive or use heavy machinery while taking prescription pain medicine.  Do not use any products that contain nicotine or tobacco, such as cigarettes and e-cigarettes. If you need help quitting, ask your health care provider.  Keep all follow-up visits as told by your health care provider. This is important. How is this prevented?  Use correct form when playing sports and lifting heavy objects.  Use good posture when sitting and standing.  Maintain a healthy weight.  Sleep on a mattress with medium firmness to support your back.  Be safe and responsible while being active to avoid falls.  Do at least 150 minutes of moderate-intensity exercise each week, such as brisk walking or water aerobics. Try a form of exercise that takes stress off your back, such as swimming or stationary cycling.  Maintain physical fitness, including: ? Strength. ? Flexibility. ? Cardiovascular fitness. ? Endurance. Contact a health care provider if:  Your back pain does not improve after 6 weeks of treatment.  Your symptoms get worse. Get help right away if:  Your back pain is severe.  You cannot stand or walk.  You have difficulty controlling  when you urinate or when you have a bowel movement.  You feel nauseous or you vomit.  Your feet get very cold.  You have numbness, tingling, weakness, or problems using your arms or legs.  You develop any of the following: ? Shortness of breath. ? Dizziness. ? Pain in your legs. ? Weakness in your buttocks or legs. ? Discoloration of the skin on your toes or legs. This information is not intended to replace advice given to you by your health care provider. Make sure you discuss any questions you have with your health care provider. Document Released: 03/29/2005 Document Revised: 01/07/2016 Document Reviewed: 11/21/2015 Elsevier Interactive Patient Education  [employer].

## 2017-11-15 ENCOUNTER — Telehealth: Payer: Self-pay

## 2017-11-15 NOTE — Telephone Encounter (Signed)
I am happy to do that but it almost certainly is going to be cheaper to buy the TENS 7000 on amazon ($30) then sending a prescription to [facility] re[facility] center even if you only have to pay 20% of the cost.  Do you still want me to send a prescription or would you like to get it over-the-counter?

## 2017-11-15 NOTE — Telephone Encounter (Signed)
Pt stated that she is waiting for a call back with exactly how much she will have to pay so she wants to hold off on sending prescription and she will call back and let us know. W.Cairo Lingenfelter, CCMA

## 2017-11-16 NOTE — Telephone Encounter (Signed)
No additional encounter note  

## 2017-11-26 ENCOUNTER — Other Ambulatory Visit: Payer: Self-pay | Admitting: Physician Assistant

## 2017-11-26 DIAGNOSIS — E039 Hypothyroidism, unspecified: Secondary | ICD-10-CM

## 2017-11-27 ENCOUNTER — Ambulatory Visit (INDEPENDENT_AMBULATORY_CARE_PROVIDER_SITE_OTHER): Payer: Medicare Other | Admitting: Physician Assistant

## 2017-11-27 ENCOUNTER — Encounter: Payer: Self-pay | Admitting: Physician Assistant

## 2017-11-27 VITALS — BP 139/79 | HR 59 | Ht 60.0 in | Wt 138.0 lb

## 2017-11-27 DIAGNOSIS — R5382 Chronic fatigue, unspecified: Secondary | ICD-10-CM | POA: Diagnosis not present

## 2017-11-27 DIAGNOSIS — M797 Fibromyalgia: Secondary | ICD-10-CM

## 2017-11-27 DIAGNOSIS — G9332 Myalgic encephalomyelitis/chronic fatigue syndrome: Secondary | ICD-10-CM | POA: Insufficient documentation

## 2017-11-27 NOTE — Progress Notes (Signed)
   Subjective:    Patient ID: Judy Russell, female    DOB: [birth year], 62 y.o.   MRN: [id number]  HPI  Patient is a 62 year old female who presents to the clinic with assistance in filling out continuing disability forms.  She has been disabled since 2002 due to fibromyalgia and chronic fatigue syndrome.  She is not currently seeing a rheumatologist.  She is tried medication approach in the past and does not seem to work.  She is using diet, exercise, coping skills.  We have not had a lot of visits regarding this condition because she has accepted it as normal.  .. Active Ambulatory Problems    Diagnosis Date Noted  . Hypothyroidism 09/04/2009  . SEIZURE DISORDER 09/04/2009  . Fibromyalgia 05/05/2015  . 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 Thyroid activity decreased 05/05/2015  . Chronic neck pain 05/05/2015  . Dyspareunia in female 07/07/2015  . 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 Plantar wart of left foot 02/29/2016  . Menopausal symptoms 07/21/2016  . Mood disorder in conditions classified elsewhere 07/21/2016  . Left leg pain 03/18/2017  . Right foot pain 03/18/2017  . History of colon resection 07/09/2017  . History of irregular heartbeat 07/09/2017  . Sinus bradycardia by electrocardiogram 07/09/2017  . Hyperlipidemia 07/11/2017  . Elevated blood pressure reading 07/11/2017  . Memory changes 07/19/2017  . Essential hypertension 07/19/2017  . 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 Perennial allergic rhinitis 08/13/2017  . CFS (chronic fatigue syndrome) 11/27/2017   Resolved Ambulatory Problems    Diagnosis Date Noted  . URI 09/04/2009  . Acute bronchitis 05/03/2016   Past Medical History:  Diagnosis Date  . Allergy   . Fibromyalgia   . HSV-2 (herpes simplex virus 2) infection   . Seizures (HCC)   . Thyroid disease       Review of Systems  All other systems reviewed and are negative.      Objective:   Physical Exam   Constitutional: She is oriented to person, place, and time. She appears well-developed and well-nourished.  Cardiovascular: Normal rate and regular rhythm.  Pulmonary/Chest: Effort normal and breath sounds normal.  Neurological: She is alert and oriented to person, place, and time.  Psychiatric: She has a normal mood and affect. Her behavior is normal.          Assessment & Plan:  Marland KitchenMarland KitchenDiagnoses and all orders for this visit:  Fibromyalgia  CFS (chronic fatigue syndrome)   Discussed with patient I did not have a lot of data since she has not come in with this concern in quite a while.  I did print off information she needed as far as labs, imaging.  I did write a letter that you can see in the EMR to support her ongoing disability.  I think she should consider rheumatology but she said that they didn't want to see her because she doesn't want to try any more medication.

## 2017-12-14 ENCOUNTER — Ambulatory Visit (INDEPENDENT_AMBULATORY_CARE_PROVIDER_SITE_OTHER): Payer: Medicare Other | Admitting: Family Medicine

## 2017-12-14 ENCOUNTER — Encounter: Payer: Self-pay | Admitting: Family Medicine

## 2017-12-14 VITALS — BP 132/67 | HR 59 | Temp 97.8°F | Wt 135.0 lb

## 2017-12-14 DIAGNOSIS — M545 Low back pain, unspecified: Secondary | ICD-10-CM

## 2017-12-14 DIAGNOSIS — R05 Cough: Secondary | ICD-10-CM | POA: Diagnosis not present

## 2017-12-14 DIAGNOSIS — R0982 Postnasal drip: Secondary | ICD-10-CM

## 2017-12-14 DIAGNOSIS — R059 Cough, unspecified: Secondary | ICD-10-CM

## 2017-12-14 MED ORDER — IPRATROPIUM BROMIDE 0.06 % NA SOLN: 2.0000 | NASAL | 6 refills | Status: DC | PRN

## 2017-12-14 MED ORDER — AZITHROMYCIN 250 MG PO TABS: 250.0000 mg | ORAL_TABLET | Freq: Every day | ORAL | 0 refills | Status: DC

## 2017-12-14 MED ORDER — GUAIFENESIN-CODEINE 100-10 MG/5ML PO SOLN: 5.0000 mL | Freq: Four times a day (QID) | ORAL | 0 refills | Status: DC | PRN

## 2017-12-14 NOTE — Progress Notes (Signed)
Judy Russell is a 62 y.o. female who presents to [facility] today for cough. [name] notes a significant bothersome cough present for about a week.  The cough is associated with a scratchy throat sore throat and wheezing.  She notes postnasal drainage which she thinks is the main cause of the cough.  This is quite bothersome and irritated.  She is tried over-the-counter medications which have not helped much.  She is using Flonase nasal spray for the last week which has not helped much at all either.  She denies severe shortness of breath chest pain or palpitations.  She denies significant wheezing.  She notes in the past Tessalon Perles and other been too expensive or not very effective for cough.  Additionally [name] here today for follow-up of her back pain.  She was seen on May 6 for right-sided low back pain.  She was unable to afford physical therapy but has been receiving chiropractic care through her friend and has had significant benefit.  She continues her home exercise program focusing on core strengthening and notes near complete resolution of her right low back pain.  She denies any radiating pain weakness or numbness.  ROS as above:  Exam:  BP 132/67   Pulse (!) 59   Temp 97.8 F (36.6 C) (Oral)   Wt 135 lb (61.2 kg)   SpO2 97%   BMI 26.37 kg/m  Gen: Well NAD HEENT: EOMI,  MMM, clear nasal discharge.  Inflamed nasal turbinates bilaterally.  Posterior pharynx with cobblestoning.  Normal tympanic membranes bilaterally.  Mild cervical lymphadenopathy present bilaterally. Lungs: Normal work of breathing. CTABL no significant wheezing present. Heart: RRR no MRG Abd: NABS, Soft. Nondistended, Nontender Exts: Brisk capillary refill, warm and well perfused.  L-spine normal motion.  Normal gait.  Patient can stand from a seated position.   Assessment and Plan: 62 y.o.  female with  Cough likely viral bronchitic type.  Plan to treat with Atrovent nasal spray for postnasal drainage.  Additionally will treat cough with codeine cough syrup.  Continue over-the-counter medications.  Use azithromycin as a back-up if not better.  Patient declined prednisone today as well.  Back pain: Improved with conservative treatment.  Watchful waiting return as needed.   Patient researched [facility] Controlled Substance Reporting System.  Meds ordered this encounter  Medications  . ipratropium (ATROVENT) 0.06 % nasal spray    Sig: Place 2 sprays into both nostrils every 4 (four) hours as needed for rhinitis.    Dispense:  10 mL    Refill:  6  . guaiFENesin-codeine 100-10 MG/5ML syrup    Sig: Take 5 mLs by mouth every 6 (six) hours as needed.    Dispense:  120 mL    Refill:  0  . azithromycin (ZITHROMAX) 250 MG tablet    Sig: Take 1 tablet (250 mg total) by mouth daily. Take first 2 tablets together, then 1 every day until finished.    Dispense:  6 tablet    Refill:  0     Historical information moved to improve visibility of documentation.  Past Medical History:  Diagnosis Date  . Allergy   . Fibromyalgia   . HSV-2 (herpes simplex virus 2) infection   . Seizures (HCC)   . Thyroid disease    Past Surgical History:  Procedure Laterality Date  . APPENDECTOMY    . DILATION AND CURETTAGE OF UTERUS    .  OOPHORECTOMY Left    Social History   Tobacco Use  . Smoking status: Never Smoker  . Smokeless tobacco: Never Used  Substance Use Topics  . Alcohol use: No   family history includes Alcohol abuse in her father; Cancer in her mother; Diabetes in her mother; Hypertension in her father; Stroke in her father.  Medications: Current Outpatient Medications  Medication Sig Dispense Refill  . ALPRAZolam (XANAX) 0.25 MG tablet Take 0.25 mg by mouth at bedtime as needed.      . fluticasone (FLONASE) 50 MCG/ACT nasal spray Place into both nostrils daily.    [name].  lamoTRIgine (LAMICTAL) 200 MG tablet TAKE 1 TABLET BY MOUTH  DAILY 90 tablet 1  . levothyroxine (SYNTHROID, LEVOTHROID) 137 MCG tablet TAKE 1 TABLET BY MOUTH  DAILY BEFORE BREAKFAST 90 tablet 0  . lisinopril (PRINIVIL,ZESTRIL) 2.5 MG tablet Take 1 tablet (2.5 mg total) by mouth daily. 90 tablet 0  . triamcinolone (NASACORT) 55 MCG/ACT AERO nasal inhaler Place 2 sprays into the nose daily. 1 Inhaler 12  . azithromycin (ZITHROMAX) 250 MG tablet Take 1 tablet (250 mg total) by mouth daily. Take first 2 tablets together, then 1 every day until finished. 6 tablet 0  . guaiFENesin-codeine 100-10 MG/5ML syrup Take 5 mLs by mouth every 6 (six) hours as needed. 120 mL 0  . ipratropium (ATROVENT) 0.06 % nasal spray Place 2 sprays into both nostrils every 4 (four) hours as needed for rhinitis. 10 mL 6   No current facility-administered medications for this visit.    Allergies  Allergen Reactions  . Decadron [Dexamethasone]     Jittery/anxiety/shaking  . Valtrex [Valacyclovir Hcl]     Headache, nausea  . Wellbutrin [Bupropion]     seizures  . Wheat   . Corn-Containing Products Itching    Health Maintenance Health Maintenance  Topic Date Due  . PAP SMEAR  02/15/1979  . MAMMOGRAM  07/09/2018 (Originally 02/15/2008)  . COLONOSCOPY  07/11/2018 (Originally 02/15/2008)  . INFLUENZA VACCINE  05/04/2025 (Originally 01/31/2018)  . TETANUS/TDAP  05/04/2025 (Originally 02/14/1977)  . HIV Screening  07/03/2058 (Originally 02/14/1973)  . Hepatitis C Screening  Completed    Discussed warning signs or symptoms. Please see discharge instructions. Patient expresses understanding.

## 2017-12-14 NOTE — Patient Instructions (Signed)
Thank you for coming in today. Continue over the counter medicine.  Use the nasal spray and codeine cough medicine as needed.,  Back up azithromycin antibiotic should be used if you worsen. It likely will not help right now.   Call or go to the emergency room if you get worse, have trouble breathing, have chest pains, or palpitations.    Acute Bronchitis, Adult Acute bronchitis is when air tubes (bronchi) in the lungs suddenly get swollen. The condition can make it hard to breathe. It can also cause these symptoms:  A cough.  Coughing up clear, yellow, or green mucus.  Wheezing.  Chest congestion.  Shortness of breath.  A fever.  Body aches.  Chills.  A sore throat.  Follow these instructions at home: Medicines  Take over-the-counter and prescription medicines only as told by your doctor.  If you were prescribed an antibiotic medicine, take it as told by your doctor. Do not stop taking the antibiotic even if you start to feel better. General instructions  Rest.  Drink enough fluids to keep your pee (urine) clear or pale yellow.  Avoid smoking and secondhand smoke. If you smoke and you need help quitting, ask your doctor. Quitting will help your lungs heal faster.  Use an inhaler, cool mist vaporizer, or humidifier as told by your doctor.  Keep all follow-up visits as told by your doctor. This is important. How is this prevented? To lower your risk of getting this condition again:  Wash your hands often with soap and water. If you cannot use soap and water, use hand sanitizer.  Avoid contact with people who have cold symptoms.  Try not to touch your hands to your mouth, nose, or eyes.  Make sure to get the flu shot every year.  Contact a doctor if:  Your symptoms do not get better in 2 weeks. Get help right away if:  You cough up blood.  You have chest pain.  You have very bad shortness of breath.  You become dehydrated.  You faint (pass out) or  keep feeling like you are going to pass out.  You keep throwing up (vomiting).  You have a very bad headache.  Your fever or chills gets worse. This information is not intended to replace advice given to you by your health care provider. Make sure you discuss any questions you have with your health care provider. Document Released: 12/06/2007 Document Revised: 01/26/2016 Document Reviewed: 12/08/2015 Elsevier Interactive Patient Education  [employer]2018 Elsevier Inc.

## 2017-12-17 ENCOUNTER — Ambulatory Visit: Payer: Medicare Other | Admitting: Family Medicine

## 2017-12-24 ENCOUNTER — Other Ambulatory Visit: Payer: Self-pay | Admitting: Physician Assistant

## 2017-12-24 DIAGNOSIS — I10 Essential (primary) hypertension: Secondary | ICD-10-CM

## 2018-01-28 ENCOUNTER — Ambulatory Visit: Payer: Medicare Other | Admitting: Physician Assistant

## 2018-01-28 ENCOUNTER — Encounter: Payer: Self-pay | Admitting: Family Medicine

## 2018-01-28 ENCOUNTER — Ambulatory Visit (INDEPENDENT_AMBULATORY_CARE_PROVIDER_SITE_OTHER): Payer: Medicare Other

## 2018-01-28 ENCOUNTER — Ambulatory Visit (INDEPENDENT_AMBULATORY_CARE_PROVIDER_SITE_OTHER): Payer: Medicare Other | Admitting: Family Medicine

## 2018-01-28 VITALS — BP 116/64 | HR 56 | Temp 97.9°F | Ht 60.0 in | Wt 139.0 lb

## 2018-01-28 DIAGNOSIS — R059 Cough, unspecified: Secondary | ICD-10-CM

## 2018-01-28 DIAGNOSIS — R05 Cough: Secondary | ICD-10-CM | POA: Diagnosis not present

## 2018-01-28 MED ORDER — LOSARTAN POTASSIUM 25 MG PO TABS: 12.5000 mg | ORAL_TABLET | Freq: Every day | ORAL | 1 refills | Status: DC

## 2018-01-28 MED ORDER — OMEPRAZOLE 40 MG PO CPDR: 40.0000 mg | DELAYED_RELEASE_CAPSULE | Freq: Every day | ORAL | 0 refills | Status: DC

## 2018-01-28 MED ORDER — BUDESONIDE-FORMOTEROL FUMARATE 160-4.5 MCG/ACT IN AERO: 1.0000 | INHALATION_SPRAY | Freq: Two times a day (BID) | RESPIRATORY_TRACT | 3 refills | Status: DC

## 2018-01-28 NOTE — Patient Instructions (Addendum)
Thank you for coming in today. Add omeprazole USe the inhaler twice daily Continue nasap spray.  STOP lisinopril  START losartan.  Recheck with Jade in 1 month.   Get xray now.    Call or go to the emergency room if you get worse, have trouble breathing, have chest pains, or palpitations.

## 2018-01-28 NOTE — Progress Notes (Signed)
Judy Russell is a 62 y.o. female who presents to [facility] today for cough.  She has had a persistent cough for 6 weeks. She has tried [employer] which provided some relief but she no longer takes it because it was causing some bleeding from her nose. She completed a course of azithromycin which helped briefly but the cough quickly returned. She also using Claritin and Atrovent which have provided some relief as well. She says she cannot use codeine because it causes her to vomit. She is not coughing up any sputum and has no associated symptoms. She is a non smoker. Of note, she began taking lisinopril 4 months ago.    ROS as above:  Exam:  BP 116/64   Pulse (!) 56   Temp 97.9 F (36.6 C) (Oral)   Ht 5' (1.524 m)   Wt 139 lb (63 kg)   SpO2 100%   BMI 27.15 kg/m  Gen: Well NAD, Coughing throughout exam  HEENT: EOMI,  MMM posterior pharynx with cobblestoning.  Clear nasal discharge.  No cervical lymphadenopathy. Lungs: Normal work of breathing. CTABL Heart: RRR no MRG Abd: NABS, Soft. Nondistended, Nontender Exts: Brisk capillary refill, warm and well perfused.   Lab and Radiology Results 2 view chest x-ray images personally independently reviewed No acute infiltrate.  No acute findings. Awaiting formal radiology review  Assessment and Plan: 62 y.o. female with cough. The cough has persisted for 6 weeks. She has had symptom reduction with Flonase but cannot take because it causes nose bleeding. She has also had some success with Claritin and Atrovent. The plan will be to first switch her lisinopril to losartan due to possibility of ACEi associated cough. We will also start omeprazole 40 mg daily as reflux may be a contributor to her cough. She was given a sample of Symbicort inhaler to try. She should continue taking the Atrovent and Claritin. She will also get a  chest x ray to evaluate for other causes of cough.  She will see her PCP in one monty.    Orders Placed This Encounter  Procedures  . DG Chest 2 View    Order Specific Question:   Reason for exam:    Answer:   Cough, assess intra-thoracic pathology    Order Specific Question:   Is the patient pregnant?    Answer:   No    Order Specific Question:   Preferred imaging location?    Answer:   [facility]   Meds ordered this encounter  Medications  . losartan (COZAAR) 25 MG tablet    Sig: Take 0.5 tablets (12.5 mg total) by mouth daily.    Dispense:  90 tablet    Refill:  1    Replaces lisinopril  . omeprazole (PRILOSEC) 40 MG capsule    Sig: Take 1 capsule (40 mg total) by mouth daily.    Dispense:  90 capsule    Refill:  0  . budesonide-formoterol (SYMBICORT) 160-4.5 MCG/ACT inhaler    Sig: Inhale 1 puff into the lungs 2 (two) times daily.    Dispense:  1 Inhaler    Refill:  3     Historical information moved to improve visibility of documentation.  Past Medical History:  Diagnosis Date  . Allergy   . Fibromyalgia   . HSV-2 (herpes simplex virus 2) infection   . Seizures (HCC)   . Thyroid disease    Past Surgical History:  Procedure Laterality Date  . APPENDECTOMY    . DILATION AND CURETTAGE OF UTERUS    . OOPHORECTOMY Left    Social History   Tobacco Use  . Smoking status: Never Smoker  . Smokeless tobacco: Never Used  Substance Use Topics  . Alcohol use: No   family history includes Alcohol abuse in her father; Cancer in her mother; Diabetes in her mother; Hypertension in her father; Stroke in her father.  Medications: Current Outpatient Medications  Medication Sig Dispense Refill  . ALPRAZolam (XANAX) 0.25 MG tablet Take 0.25 mg by mouth at bedtime as needed.      [name]. ipratropium (ATROVENT) 0.06 % nasal spray Place 2 sprays into both nostrils every 4 (four) hours as needed for rhinitis. 10 mL 6  . lamoTRIgine (LAMICTAL) 200 MG tablet TAKE 1 TABLET  BY MOUTH  DAILY 90 tablet 1  . levothyroxine (SYNTHROID, LEVOTHROID) 137 MCG tablet TAKE 1 TABLET BY MOUTH  DAILY BEFORE BREAKFAST 90 tablet 0  . budesonide-formoterol (SYMBICORT) 160-4.5 MCG/ACT inhaler Inhale 1 puff into the lungs 2 (two) times daily. 1 Inhaler 3  . losartan (COZAAR) 25 MG tablet Take 0.5 tablets (12.5 mg total) by mouth daily. 90 tablet 1  . omeprazole (PRILOSEC) 40 MG capsule Take 1 capsule (40 mg total) by mouth daily. 90 capsule 0   No current facility-administered medications for this visit.    Allergies  Allergen Reactions  . Decadron [Dexamethasone]     Jittery/anxiety/shaking  . Valtrex [Valacyclovir Hcl]     Headache, nausea  . Wellbutrin [Bupropion]     seizures  . Wheat   . Corn-Containing Products Itching     Discussed warning signs or symptoms. Please see discharge instructions. Patient expresses understanding.  I personally was present and performed or re-performed the history, physical exam and medical decision-making activities of this service and have verified that the service and findings are accurately documented in the student's note. ___________________________________________ Clementeen GrahamEvan Corey M.D., ABFM., CAQSM. Primary Care and Sports Medicine Adjunct Instructor of Family Medicine  [employer]

## 2018-02-04 ENCOUNTER — Telehealth: Payer: Self-pay | Admitting: Physician Assistant

## 2018-02-04 NOTE — Telephone Encounter (Signed)
Pt has been in office twice for coughing. She was most recently given an antibiotic from Dr Denyse Amassorey and reports this did help some. She states she is still having some postnasal drip, which is causing her to cough so hard she vomits. Pt requesting an additional zpac be sent in to try and "knock this out."   Routing to Provider in office today for review.

## 2018-02-05 NOTE — Telephone Encounter (Signed)
Pt advised, requested to be scheduled with PCP next week.

## 2018-02-05 NOTE — Telephone Encounter (Addendum)
I reviewed Dr. Zollie Peeorey's note, as well as recent chest x-ray results.  I do not think she needs another Z-Pak if the first 1 was not helpful this is not likely anything that antibiotics will fix.  Wound like he was more suspicious for chronic cough due to postnasal drip or possibly GERD or lisinopril.  He switched her blood pressure medications from lisinopril to losartan, can we make sure that she has done this.  If her cough is persisting beyond expected for viral illness and the x-ray was looking okay as and no pneumonia, she may need referral to a pulmonologist.  X-ray was concerning for possible COPD/chronic bronchitis.  She been using the Symbicort?    Point is, she needs a visit or I can refer to pulm!

## 2018-02-11 ENCOUNTER — Other Ambulatory Visit: Payer: Self-pay | Admitting: Physician Assistant

## 2018-02-11 DIAGNOSIS — F0630 Mood disorder due to known physiological condition, unspecified: Secondary | ICD-10-CM

## 2018-02-13 ENCOUNTER — Ambulatory Visit (INDEPENDENT_AMBULATORY_CARE_PROVIDER_SITE_OTHER): Payer: Medicare Other | Admitting: Physician Assistant

## 2018-02-13 ENCOUNTER — Encounter: Payer: Self-pay | Admitting: Physician Assistant

## 2018-02-13 VITALS — BP 137/79 | HR 55 | Ht 60.0 in | Wt 135.0 lb

## 2018-02-13 DIAGNOSIS — R05 Cough: Secondary | ICD-10-CM | POA: Diagnosis not present

## 2018-02-13 DIAGNOSIS — R058 Other specified cough: Secondary | ICD-10-CM

## 2018-02-13 MED ORDER — METHYLPREDNISOLONE 4 MG PO TBPK: ORAL_TABLET | ORAL | 0 refills | Status: DC

## 2018-02-13 NOTE — Patient Instructions (Addendum)
Start medrol dose pack.   Cough, Adult A cough helps to clear your throat and lungs. A cough may last only 2-3 weeks (acute), or it may last longer than 8 weeks (chronic). Many different things can cause a cough. A cough may be a sign of an illness or another medical condition. Follow these instructions at home:  Pay attention to any changes in your cough.  Take medicines only as told by your doctor. ? If you were prescribed an antibiotic medicine, take it as told by your doctor. Do not stop taking it even if you start to feel better. ? Talk with your doctor before you try using a cough medicine.  Drink enough fluid to keep your pee (urine) clear or pale yellow.  If the air is dry, use a cold steam vaporizer or humidifier in your home.  Stay away from things that make you cough at work or at home.  If your cough is worse at night, try using extra pillows to raise your head up higher while you sleep.  Do not smoke, and try not to be around smoke. If you need help quitting, ask your doctor.  Do not have caffeine.  Do not drink alcohol.  Rest as needed. Contact a doctor if:  You have new problems (symptoms).  You cough up yellow fluid (pus).  Your cough does not get better after 2-3 weeks, or your cough gets worse.  Medicine does not help your cough and you are not sleeping well.  You have pain that gets worse or pain that is not helped with medicine.  You have a fever.  You are losing weight and you do not know why.  You have night sweats. Get help right away if:  You cough up blood.  You have trouble breathing.  Your heartbeat is very fast. This information is not intended to replace advice given to you by your health care provider. Make sure you discuss any questions you have with your health care provider. Document Released: 03/02/2011 Document Revised: 11/25/2015 Document Reviewed: 08/26/2014 Elsevier Interactive Patient Education  [employer]2018 Elsevier Inc.

## 2018-02-13 NOTE — Progress Notes (Deleted)
Thank you for coming in today. Add omeprazole USe the inhaler twice daily Continue nasap spray.  STOP lisinopril  START losartan.  Recheck with Franko Hilliker in 1 month.   Get xray now.   Still coughing-omperzole(did not start). Did not like side effect  Cozaar-stopped 111/62  12/14/17 started  Coughing up clear worse at nights.   symbicort

## 2018-02-14 NOTE — Progress Notes (Signed)
   Subjective:    Patient ID: Judy [facility], female    DOB: 18-Feb-1956, 62 y.o.   MRN: [id number]  HPI  Pt is a 62 yo female who presents to the clinic with a persistent cough for almost 8 weeks. She is really aggravated by this cough. She will be fine and then "go into a fit". She denies any fever, chills, body aches. She has been seen at least 3 times for this. She has finished a round of zpak, started Symbicort, using albuterol inhaler, stop lisinopril, using nasal spray with no relief. CXR done and showed evidence of bronchitis. She did not start omeprazole due to side effects she read. She also "does not feel like she has reflux". She is not taking any BP medication as well. Her BP when she is checking it is 110's over 60's. Cough seems to be a little worse at night. She has not tried a steroid due to her adverse reaction to decadron.   .. Active Ambulatory Problems    Diagnosis Date Noted  . Hypothyroidism 09/04/2009  . SEIZURE DISORDER 09/04/2009  . Fibromyalgia 05/05/2015  . 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 Thyroid activity decreased 05/05/2015  . Chronic neck pain 05/05/2015  . Dyspareunia in female 07/07/2015  . 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 Plantar wart of left foot 02/29/2016  . Menopausal symptoms 07/21/2016  . Mood disorder in conditions classified elsewhere 07/21/2016  . Left leg pain 03/18/2017  . Right foot pain 03/18/2017  . History of colon resection 07/09/2017  . History of irregular heartbeat 07/09/2017  . Sinus bradycardia by electrocardiogram 07/09/2017  . Hyperlipidemia 07/11/2017  . Elevated blood pressure reading 07/11/2017  . Memory changes 07/19/2017  . 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 Perennial allergic rhinitis 08/13/2017  . CFS (chronic fatigue syndrome) 11/27/2017   Resolved Ambulatory Problems    Diagnosis Date Noted  . URI 09/04/2009  . Acute bronchitis 05/03/2016  . Essential hypertension 07/19/2017    Past Medical History:  Diagnosis Date  . Allergy   . HSV-2 (herpes simplex virus 2) infection   . Seizures (HCC)   . Thyroid disease       Review of Systems  All other systems reviewed and are negative.      Objective:   Physical Exam  Constitutional: She is oriented to person, place, and time. She appears well-developed and well-nourished.  HENT:  Head: Normocephalic and atraumatic.  Cardiovascular: Normal rate and regular rhythm.  Pulmonary/Chest: Effort normal.  Cough elicited on exam. Dry, hacking with external wheezing.   Neurological: She is alert and oriented to person, place, and time.  Psychiatric: She has a normal mood and affect. Her behavior is normal.          Assessment & Plan:  [name].[name].Diagnoses and all orders for this visit:  Post-viral cough syndrome -     methylPREDNISolone (MEDROL DOSEPAK) 4 MG TBPK tablet; Take as directed by package insert.     Appears to be post viral cough syndrome. Concern for neurogenic cough. She has not tried steroid. Will use a lower potency and she how she does. Continue flonase, lemon drops and previous treatment.  BP looks better and home readings are great. Ok to stay on BP medications. Continue to monitor. HO given. May need to consider amitripyline in the future if no improvement.

## 2018-03-01 ENCOUNTER — Ambulatory Visit: Payer: Medicare Other | Admitting: Physician Assistant

## 2018-03-18 ENCOUNTER — Other Ambulatory Visit: Payer: Self-pay | Admitting: Family Medicine

## 2018-03-25 ENCOUNTER — Other Ambulatory Visit: Payer: Self-pay | Admitting: Physician Assistant

## 2018-03-25 ENCOUNTER — Ambulatory Visit (INDEPENDENT_AMBULATORY_CARE_PROVIDER_SITE_OTHER): Payer: Medicare Other | Admitting: Physician Assistant

## 2018-03-25 ENCOUNTER — Other Ambulatory Visit: Payer: Self-pay

## 2018-03-25 ENCOUNTER — Encounter: Payer: Self-pay | Admitting: Physician Assistant

## 2018-03-25 VITALS — BP 132/80 | HR 58 | Ht 60.0 in | Wt 142.0 lb

## 2018-03-25 DIAGNOSIS — Z9109 Other allergy status, other than to drugs and biological substances: Secondary | ICD-10-CM | POA: Insufficient documentation

## 2018-03-25 DIAGNOSIS — M797 Fibromyalgia: Secondary | ICD-10-CM

## 2018-03-25 DIAGNOSIS — H1013 Acute atopic conjunctivitis, bilateral: Secondary | ICD-10-CM

## 2018-03-25 DIAGNOSIS — J3089 Other allergic rhinitis: Secondary | ICD-10-CM

## 2018-03-25 DIAGNOSIS — G9332 Myalgic encephalomyelitis/chronic fatigue syndrome: Secondary | ICD-10-CM

## 2018-03-25 DIAGNOSIS — E039 Hypothyroidism, unspecified: Secondary | ICD-10-CM

## 2018-03-25 DIAGNOSIS — R5382 Chronic fatigue, unspecified: Secondary | ICD-10-CM | POA: Diagnosis not present

## 2018-03-25 DIAGNOSIS — Z91018 Allergy to other foods: Secondary | ICD-10-CM | POA: Insufficient documentation

## 2018-03-25 MED ORDER — MONTELUKAST SODIUM 10 MG PO TABS: 10.0000 mg | ORAL_TABLET | Freq: Every day | ORAL | 3 refills | Status: DC

## 2018-03-25 MED ORDER — AZELASTINE HCL 0.05 % OP SOLN: 1.0000 [drp] | Freq: Two times a day (BID) | OPHTHALMIC | 2 refills | Status: DC

## 2018-03-25 NOTE — Progress Notes (Signed)
Subjective:    Patient ID: Judy Russell, female    DOB: August 23, 1955, 62 y.o.   MRN: [id number]  HPI  Pt is a 62 yo female with perennial allergic rhinitis, hypothyroidism, CFS, food and environmental allergies who presents to the clinic for follow up.   She recently saw rheumatologist who encouraged her to keep treating fatigue and fibromyaglia with exercise and healthy diet. No medications were discussed. He did order bone density.   For the last 2 weeks her allergies have been worse. She has allergies to cats, corn, wheat, grass, mold, fungus, dust, trees. Her eyes are very itchy and has some PND with throat clearning. She is wondering what she can do about it. She does live with cats. She is taking claritin daily. No fever, chills, SOB, or wheezing.   .. Active Ambulatory Problems    Diagnosis Date Noted  . Hypothyroidism 09/04/2009  . SEIZURE DISORDER 09/04/2009  . Fibromyalgia 05/05/2015  . 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 Thyroid activity decreased 05/05/2015  . Chronic neck pain 05/05/2015  . Dyspareunia in female 07/07/2015  . 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 Plantar wart of left foot 02/29/2016  . Menopausal symptoms 07/21/2016  . Mood disorder in conditions classified elsewhere 07/21/2016  . Left leg pain 03/18/2017  . Right foot pain 03/18/2017  . History of colon resection 07/09/2017  . History of irregular heartbeat 07/09/2017  . Sinus bradycardia by electrocardiogram 07/09/2017  . Hyperlipidemia 07/11/2017  . Elevated blood pressure reading 07/11/2017  . Memory changes 07/19/2017  . 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 Perennial allergic rhinitis 08/13/2017  . CFS (chronic fatigue syndrome) 11/27/2017  . Multiple food allergies 03/25/2018  . Environmental allergies 03/25/2018   Resolved Ambulatory Problems    Diagnosis Date Noted  . URI 09/04/2009  . Acute bronchitis 05/03/2016  . Essential hypertension 07/19/2017   Past  Medical History:  Diagnosis Date  . Allergy   . HSV-2 (herpes simplex virus 2) infection   . Seizures (HCC)   . Thyroid disease      Review of Systems  All other systems reviewed and are negative.      Objective:   Physical Exam  Constitutional: She is oriented to person, place, and time. She appears well-developed and well-nourished.  HENT:  Head: Normocephalic and atraumatic.  Right Ear: External ear normal.  Left Ear: External ear normal.  Nose: Nose normal.  Mouth/Throat: Oropharynx is clear and moist. No oropharyngeal exudate.  TM's clear.  Negative for sinus tenderness.   Eyes: Pupils are equal, round, and reactive to light. Right eye exhibits discharge. Left eye exhibits discharge.  Bilateral injected conjunctiva with watery discharge.   Neck: Normal range of motion. Neck supple.  Cardiovascular: Normal rate and regular rhythm.  Pulmonary/Chest: Effort normal and breath sounds normal.  Lymphadenopathy:    She has no cervical adenopathy.  Neurological: She is alert and oriented to person, place, and time.  Skin: No rash noted.  Psychiatric: She has a normal mood and affect. Her behavior is normal.          Assessment & Plan:  Marland KitchenMarland KitchenDiagnoses and all orders for this visit:  Perennial allergic rhinitis -     montelukast (SINGULAIR) 10 MG tablet; Take 1 tablet (10 mg total) by mouth at bedtime. -     Ambulatory referral to Allergy  CFS (chronic fatigue syndrome)  Fibromyalgia  Environmental allergies -     montelukast (SINGULAIR) 10 MG tablet; Take 1 tablet (  10 mg total) by mouth at bedtime. -     Ambulatory referral to Allergy  Multiple food allergies -     montelukast (SINGULAIR) 10 MG tablet; Take 1 tablet (10 mg total) by mouth at bedtime. -     Ambulatory referral to Allergy  Allergic conjunctivitis of both eyes -     azelastine (OPTIVAR) 0.05 % ophthalmic solution; Place 1 drop into both eyes 2 (two) times daily. -     Ambulatory referral to  Allergy   Added singulair and optivar to try. Will refer to allergy to consider allergy shots.   cologuard faxed for colon cancer screening.   Discussed antiinflammatory diet. HO given. Perhaps could help with fatigue and myalgias. Could look into cymbalta as well.

## 2018-03-25 NOTE — Patient Instructions (Addendum)
Start singulair daily for allergies.     Myofascial Pain Syndrome and Fibromyalgia Myofascial pain syndrome and fibromyalgia are both pain disorders. This pain may be felt mainly in your muscles.  Myofascial pain syndrome: ? Always has trigger points or tender points in the muscle that will cause pain when pressed. The pain may come and go. ? Usually affects your neck, upper back, and shoulder areas. The pain often radiates into your arms and hands.  Fibromyalgia: ? Has muscle pains and tenderness that come and go. ? Is often associated with fatigue and sleep disturbances. ? Has trigger points. ? Tends to be long-lasting (chronic), but is not life-threatening.  Fibromyalgia and myofascial pain are not the same. However, they often occur together. If you have both conditions, each can make the other worse. Both are common and can cause enough pain and fatigue to make day-to-day activities difficult. What are the causes? The exact causes of fibromyalgia and myofascial pain are not known. People with certain gene types may be more likely to develop fibromyalgia. Some factors can be triggers for both conditions, such as:  Spine disorders.  Arthritis.  Severe injury (trauma) and other physical stressors.  Being under a lot of stress.  A medical illness.  What are the signs or symptoms? Fibromyalgia The main symptom of fibromyalgia is widespread pain and tenderness in your muscles. This can vary over time. Pain is sometimes described as stabbing, shooting, or burning. You may have tingling or numbness, too. You may also have sleep problems and fatigue. You may wake up feeling tired and groggy (fibro fog). Other symptoms may include:  Bowel and bladder problems.  Headaches.  Visual problems.  Problems with odors and noises.  Depression or mood changes.  Painful menstrual periods (dysmenorrhea).  Dry skin or eyes.  Myofascial pain syndrome Symptoms of myofascial pain  syndrome include:  Tight, ropy bands of muscle.  Uncomfortable sensations in muscular areas, such as: ? Aching. ? Cramping. ? Burning. ? Numbness. ? Tingling. ? Muscle weakness.  Trouble moving certain muscles freely (range of motion).  How is this diagnosed? There are no specific tests to diagnose fibromyalgia or myofascial pain syndrome. Both can be hard to diagnose because their symptoms are common in many other conditions. Your health care provider may suspect one or both of these conditions based on your symptoms and medical history. Your health care provider will also do a physical exam. The key to diagnosing fibromyalgia is having pain, fatigue, and other symptoms for more than three months that cannot be explained by another condition. The key to diagnosing myofascial pain syndrome is finding trigger points in muscles that are tender and cause pain elsewhere in your body (referred pain). How is this treated? Treating fibromyalgia and myofascial pain often requires a team of health care providers. This usually starts with your primary provider and a physical therapist. You may also find it helpful to work with alternative health care providers, such as massage therapists or acupuncturists. Treatment for fibromyalgia may include medicines. This may include nonsteroidal anti-inflammatory drugs (NSAIDs), along with other medicines. Treatment for myofascial pain may also include:  NSAIDs.  Cooling and stretching of muscles.  Trigger point injections.  Sound wave (ultrasound) treatments to stimulate muscles.  Follow these instructions at home:  Take medicines only as directed by your health care provider.  Exercise as directed by your health care provider or physical therapist.  Try to avoid stressful situations.  Practice relaxation techniques to control your stress.  You may want to try: ? Biofeedback. ? Visual imagery. ? Hypnosis. ? Muscle  relaxation. ? Yoga. ? Meditation.  Talk to your health care provider about alternative treatments, such as acupuncture or massage treatment.  Maintain a healthy lifestyle. This includes eating a healthy diet and getting enough sleep.  Consider joining a support group.  Do not do activities that stress or strain your muscles. That includes repetitive motions and heavy lifting. Where to find more information:  National Fibromyalgia Association: www.fmaware.org  Arthritis Foundation: www.arthritis.org  American Chronic Pain Association: GumSearch.nl Contact a health care provider if:  You have new symptoms.  Your symptoms get worse.  You have side effects from your medicines.  You have trouble sleeping.  Your condition is causing depression or anxiety. This information is not intended to replace advice given to you by your health care provider. Make sure you discuss any questions you have with your health care provider. Document Released: 06/19/2005 Document Revised: 11/25/2015 Document Reviewed: 03/25/2014 Elsevier Interactive Patient Education  [employer].

## 2018-03-27 ENCOUNTER — Ambulatory Visit (INDEPENDENT_AMBULATORY_CARE_PROVIDER_SITE_OTHER): Payer: Medicare Other | Admitting: Physician Assistant

## 2018-03-27 ENCOUNTER — Encounter: Payer: Self-pay | Admitting: Physician Assistant

## 2018-03-27 VITALS — BP 152/73 | HR 52 | Ht 60.0 in | Wt 137.0 lb

## 2018-03-27 DIAGNOSIS — E039 Hypothyroidism, unspecified: Secondary | ICD-10-CM | POA: Diagnosis not present

## 2018-03-27 DIAGNOSIS — E782 Mixed hyperlipidemia: Secondary | ICD-10-CM

## 2018-03-27 DIAGNOSIS — T887XXA Unspecified adverse effect of drug or medicament, initial encounter: Secondary | ICD-10-CM | POA: Diagnosis not present

## 2018-03-27 NOTE — Progress Notes (Signed)
   Subjective:    Patient ID: Judy Russell, female    DOB: 08/21/1955, 62 y.o.   MRN: [id number]  HPI  Pt is a 62 yo female with chronic allergic rhinitis/conjuncivitis, multiple food/environmental allergies she has been on claritin for years. She has had allergies her whole life. She is concerned her claritin has been contributing to her allergy symptoms and also making her fibromyalgia/CFS worse. She read side effects of claritin and singulair and they match the things she feels.   .. Active Ambulatory Problems    Diagnosis Date Noted  . Hypothyroidism 09/04/2009  . SEIZURE DISORDER 09/04/2009  . Fibromyalgia 05/05/2015  . 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 Thyroid activity decreased 05/05/2015  . Chronic neck pain 05/05/2015  . Dyspareunia in female 07/07/2015  . 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 Plantar wart of left foot 02/29/2016  . Menopausal symptoms 07/21/2016  . Mood disorder in conditions classified elsewhere 07/21/2016  . Left leg pain 03/18/2017  . Right foot pain 03/18/2017  . History of colon resection 07/09/2017  . History of irregular heartbeat 07/09/2017  . Sinus bradycardia by electrocardiogram 07/09/2017  . Hyperlipidemia 07/11/2017  . Elevated blood pressure reading 07/11/2017  . Memory changes 07/19/2017  . 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 Perennial allergic rhinitis 08/13/2017  . CFS (chronic fatigue syndrome) 11/27/2017  . Multiple food allergies 03/25/2018  . Environmental allergies 03/25/2018   Resolved Ambulatory Problems    Diagnosis Date Noted  . URI 09/04/2009  . Acute bronchitis 05/03/2016  . Essential hypertension 07/19/2017   Past Medical History:  Diagnosis Date  . Allergy   . HSV-2 (herpes simplex virus 2) infection   . Seizures (HCC)   . Thyroid disease       Review of Systems  All other systems reviewed and are negative.      Objective:   Physical Exam  Constitutional: She is oriented  to person, place, and time. She appears well-developed and well-nourished.  HENT:  Head: Normocephalic and atraumatic.  Eyes: Right eye exhibits discharge. Left eye exhibits discharge.  Watery discharge from both eyes.   Cardiovascular: Normal rate.  Pulmonary/Chest: Effort normal.  Neurological: She is alert and oriented to person, place, and time.  Psychiatric: She has a normal mood and affect. Her behavior is normal.          Assessment & Plan:  Marland KitchenMarland KitchenDiagnoses and all orders for this visit:  Medication side effect  Mixed hyperlipidemia -     Lipid Panel w/reflex Direct LDL  Acquired hypothyroidism -     TSH + free T4   Stop claritin and singular. Wait for allergy appt. Discussed that it is difficult to treat allergies without an anti-histamine on board. I am ok with [place] seeing if she feels better at all but reassured her that not all patient have the side effects listed on medication bottle. Continue with nasal saline washes and avoidance of her triggers.   Labs ordered for medication management.

## 2018-04-16 ENCOUNTER — Ambulatory Visit: Payer: Medicare Other | Admitting: Allergy and Immunology

## 2018-04-25 ENCOUNTER — Encounter: Payer: Self-pay | Admitting: Physician Assistant

## 2018-04-25 LAB — COLOGUARD

## 2018-05-07 LAB — LIPID PANEL W/REFLEX DIRECT LDL
CHOLESTEROL: 248 mg/dL — AB (ref <200)
HDL: 64 mg/dL (ref >50)
LDL Cholesterol (Calc): 166 mg/dL (calc) — ABNORMAL HIGH
Non-HDL Cholesterol (Calc): 184 mg/dL (calc) — ABNORMAL HIGH (ref <130)
Total CHOL/HDL Ratio: 3.9 (calc) (ref <5.0)
Triglycerides: 76 mg/dL (ref <150)

## 2018-05-07 LAB — TSH+FREE T4: TSH W/REFLEX TO FT4: 0.14 m[IU]/L — AB (ref 0.40–4.50)

## 2018-05-07 LAB — T4, FREE: FREE T4: 2.2 ng/dL — AB (ref 0.8–1.8)

## 2018-05-07 NOTE — Progress Notes (Signed)
Call pt: TSH is too low. Need to decrease levothyroxine dose. You have been at the 137 since spring increase right?  Cholesterol about the same. CV risk is 4.4 percent with most recent calculation. Guidelines state above 7.5 percent need statin. Will continue to monitor. HDL is great.

## 2018-05-10 NOTE — Congregational Nurse Program (Signed)
  Dept: [phone]   Congregational Nurse Program Note  Date of Encounter: 05/10/2018  Past Medical History: Past Medical History:  Diagnosis Date  . Allergy   . Fibromyalgia   . HSV-2 (herpes simplex virus 2) infection   . Seizures (HCC)   . Thyroid disease     Encounter Details: CNP Questionnaire - 05/10/18 1121      Questionnaire   Patient Status  Not Applicable    Race  White or Caucasian    Location Patient Served At  [employer] [employer]    Uninsured  Not Applicable    Food  No food insecurities    Housing/Utilities  Yes, have permanent housing    Transportation  No transportation needs    Interpersonal Safety  Yes, feel physically and emotionally safe where you currently live    Medication  No medication insecurities    Medical Provider  Yes    Referrals  Not Applicable    ED Visit Averted  Not Applicable    Life-Saving Intervention Made  Not Applicable      05/10/18  Check blood pressure  B/P 138/75 Pulse 48. Saw Dr.. Thyroid med. Was changed.  Dosage was increased. Feeling ok.  Cecilie Kicks, RN [phone].

## 2018-05-13 ENCOUNTER — Other Ambulatory Visit: Payer: Self-pay | Admitting: Physician Assistant

## 2018-05-13 DIAGNOSIS — F0630 Mood disorder due to known physiological condition, unspecified: Secondary | ICD-10-CM

## 2018-05-13 MED ORDER — LEVOTHYROXINE SODIUM 125 MCG PO TABS: 125.0000 ug | ORAL_TABLET | Freq: Every day | ORAL | 0 refills | Status: DC

## 2018-05-13 NOTE — Progress Notes (Signed)
Ok I sent to [facility]. Recheck after being on new dose in 6 weeks. In January dose was increased to . If is not enough and is too much then we will have to then come up with a combination dose. I hope not to do this has it is confusing.

## 2018-06-20 ENCOUNTER — Encounter: Payer: Self-pay | Admitting: Family Medicine

## 2018-06-20 ENCOUNTER — Ambulatory Visit (INDEPENDENT_AMBULATORY_CARE_PROVIDER_SITE_OTHER): Payer: Medicare Other | Admitting: Family Medicine

## 2018-06-20 VITALS — BP 141/79 | HR 66 | Ht 61.0 in | Wt 128.0 lb

## 2018-06-20 DIAGNOSIS — B079 Viral wart, unspecified: Secondary | ICD-10-CM

## 2018-06-20 MED ORDER — DICLOFENAC SODIUM 1 % TD GEL: 2.0000 g | Freq: Four times a day (QID) | TRANSDERMAL | 11 refills | Status: DC

## 2018-06-20 NOTE — Progress Notes (Signed)
Judy Russell is a 62 y.o. female who [facility] today for finger pain. Mrs. Lu DuffelDoyon has had a "ball" on the volar surface of the DIP joint of the third finger on the left hand for about the past two months. It has gradually gotten more painful and is particularly painful when she picks something up and puts pressure on it.    Additionally patient notes episodes of hand and wrist spasm where her entire hand and wrist become spasmed out of the flexed position.  She notes this is been ongoing years and happens infrequently.  She notes is not particularly bothersome or painful.  She denies any significant changes in this issue.  ROS:  As above  Exam:  BP (!) 141/79   Pulse 66   Ht 5\' 1"  (1.549 m)   Wt 128 lb (58.1 kg)   BMI 24.19 kg/m  General: Well Developed, well nourished, and in no acute distress.  Neuro/Psych: Alert and oriented x3, extra-ocular muscles intact, able to move all 4 extremities, sensation grossly intact. Skin: Warm and dry, no rashes noted.  Respiratory: Not using accessory muscles, speaking in full sentences, trachea midline.  Cardiovascular: Pulses palpable, no extremity edema. Abdomen: Does not appear distended. MSK:  Left hand:  Appearance of degenerative changes of osteoarthritis.  Small, 1 mm firm cutaneous nodule with central umbilication consistent with early wart.  Tender to palpation on the volar surface of the DIP of the third finger.  Limited ROM due to degenerative changes. 5/5 strength with hand grip, finger abduction, and extension.  No triggering present. Pulses and capillary refill normal.    Lab and Radiology Results Limited musculoskeletal ultrasound left hand third digit palmar aspect reveals solid structure just and subcutaneous consistent with wart.  Ultrasound is not consistent with gain and cyst.  No significant vascular activity present.  Normal bony structures.  Normal flexor  tendon.  Assessment and Plan: 62 y.o. female with  Wart: Mrs. Lu DuffelDoyon has a palpable round mass in the volar DIP on the third finger on her left hand. Concerned for ganglion cyst vs. wart. Ultrasound revealed this unlikely to be a ganglion cyst. Used loop glasses to look at this and it looks most like a wart. Advised pt to use OTC salicylic acid for treatment. If it does not improve, can consider removal in office or sending her to a hand surgeon. Prescribed diclofenac gel to use as needed for pain. Follow-up as needed.   Finger flexion: This is not likely to be trigger finger as her finger flexes at the MCP joint and her whole hand/wrist flexes at the same time. Follow-up as needed.     No orders of the defined types were placed in this encounter.  Meds ordered this encounter  Medications  . diclofenac sodium (VOLTAREN) 1 % GEL    Sig: Apply 2 g topically 4 (four) times daily. To affected joint.    Dispense:  100 g    Refill:  11    Historical information moved to improve visibility of documentation.  Past Medical History:  Diagnosis Date  . Allergy   . Fibromyalgia   . HSV-2 (herpes simplex virus 2) infection   . Seizures (HCC)   . Thyroid disease    Past Surgical History:  Procedure Laterality Date  . APPENDECTOMY    . DILATION AND CURETTAGE OF UTERUS    . OOPHORECTOMY Left    Social History   Tobacco Use  .  Smoking status: Never Smoker  . Smokeless tobacco: Never Used  Substance Use Topics  . Alcohol use: No   family history includes Alcohol abuse in her father; Cancer in her mother; Diabetes in her mother; Hypertension in her father; Stroke in her father.  Medications: Current Outpatient Medications  Medication Sig Dispense Refill  . ALPRAZolam (XANAX) 0.25 MG tablet Take 0.25 mg by mouth at bedtime as needed.      . lamoTRIgine (LAMICTAL) 200 MG tablet TAKE 1 TABLET BY MOUTH  DAILY 90 tablet 0  . levothyroxine (SYNTHROID, LEVOTHROID) 125 MCG tablet Take 1 tablet  (125 mcg total) by mouth daily. 90 tablet 0  . omeprazole (PRILOSEC) 40 MG capsule TAKE 1 CAPSULE BY MOUTH  DAILY 90 capsule 0  . diclofenac sodium (VOLTAREN) 1 % GEL Apply 2 g topically 4 (four) times daily. To affected joint. 100 g 11   No current facility-administered medications for this visit.    Allergies  Allergen Reactions  . Decadron [Dexamethasone]     Jittery/anxiety/shaking  . Valtrex [Valacyclovir Hcl]     Headache, nausea  . Wellbutrin [Bupropion]     seizures  . Wheat   . Corn-Containing Products Itching      Discussed warning signs or symptoms. Please see discharge instructions. Patient expresses understanding.  I personally was present and performed or re-performed the history, physical exam and medical decision-making activities of this service and have verified that the service and findings are accurately documented in the student's note. ___________________________________________ Clementeen GrahamEvan Wajiha Versteeg M.D., ABFM., CAQSM. Primary Care and Sports Medicine Adjunct Instructor of Family Medicine  [employer]

## 2018-06-20 NOTE — Patient Instructions (Addendum)
Thank you for coming in today. I think this is a tiny wart.  Ok to treat with over the counter wart treatment.  If not better we can do a tiny surgery here in the office.  We could also have you see a Hydrographic surveyorhand surgeon.   Apply the diclofenac gel to the hand joints as needed for pain.   Recheck as needed    Warts  Warts are small growths on the skin. They are common and can occur on many areas of the body. A person may have one wart or several warts. In many cases, warts do not require treatment. They usually go away on their own over a period of many months to a few years. If needed, warts that cause problems or do not go away on their own can be treated. What are the causes? Warts are caused by a type of virus that is called human papillomavirus (HPV).  This virus can spread from person to person through direct contact.  Warts can also spread to other areas of the body when a person scratches a wart and then scratches another area of his or her body. What increases the risk? You are more likely to develop this condition if:  You are 6210-60 years old.  You have a weakened body defense system (immune system).  You are Caucasian. What are the signs or symptoms? The main symptom of this condition is small growths on the skin. Warts may:  Be round or oval or have an irregular shape.  Have a rough surface.  Range in color from skin color to light yellow, brown, or gray.  Generally be less than  inch (1.3 cm) in size.  Go away and then come back again. Most warts are painless, but some can be painful if they are large or occur in an area of the body where pressure will be applied to them, such as the bottom of the foot. How is this diagnosed? A wart can usually be diagnosed based on its appearance. In some cases, a tissue sample may be removed (biopsy) to be looked at under a microscope. How is this treated? In many cases, warts do not need treatment. Sometimes treatment is  desired. If treatment is needed or desired, options may include:  Applying medicated solutions, creams, or patches to the wart. These may be over-the-counter or prescription medicines that make the skin soft so that layers will gradually shed away. In many cases, the medicine is applied one or two times per day and covered with a bandage.  Putting duct tape over the top of the wart (occlusion). You will leave the tape in place for as long as told by your health care provider and then replace it with a new strip of tape. This is done until the wart goes away.  Freezing the wart with liquid nitrogen (cryotherapy).  Burning the wart with: ? Laser treatment. ? An electrified probe (electrocautery).  Injection of a medicine (Candida antigen) into the wart to help the body's immune system fight off the wart.  Surgery to remove the wart. Follow these instructions at home: Medicines  Apply over-the-counter and prescription medicines only as told by your health care provider.  Do not apply over-the-counter wart medicines to your face or genitals unless your health care provider tells you to do that. Lifestyle  Keep your immune system healthy. To do this: ? Eat a healthy, balanced diet. ? Get enough sleep. ? Do not use any products that contain  nicotine or tobacco, such as cigarettes and e-cigarettes. If you need help quitting, ask your health care provider. General instructions   Wash your hands after you touch a wart.  Do not scratch or pick at a wart.  Avoid shaving hair that is over a wart.  Keep all follow-up visits as told by your health care provider. This is important. Contact a health care provider if:  Your warts do not improve after treatment.  You have redness, swelling, or pain at the site of a wart.  You have bleeding from a wart that does not stop with light pressure.  You have diabetes and you develop a wart. Summary  Warts are small growths on the skin. They are  common and can occur on many areas of the body.  In many cases, warts do not need treatment. Sometimes treatment is desired. If treatment is needed or desired, there are several treatment options.  Apply over-the-counter and prescription medicines only as told by your health care provider.  Wash your hands after you touch a wart.  Keep all follow-up visits as told by your health care provider. This is important. This information is not intended to replace advice given to you by your health care provider. Make sure you discuss any questions you have with your health care provider. Document Released: 03/29/2005 Document Revised: 11/06/2017 Document Reviewed: 11/06/2017 Elsevier Interactive Patient Education  2019 [employer]

## 2018-07-14 ENCOUNTER — Other Ambulatory Visit: Payer: Self-pay | Admitting: Physician Assistant

## 2018-07-25 ENCOUNTER — Encounter: Payer: Self-pay | Admitting: Physician Assistant

## 2018-07-25 ENCOUNTER — Ambulatory Visit (INDEPENDENT_AMBULATORY_CARE_PROVIDER_SITE_OTHER): Payer: Medicare Other | Admitting: Physician Assistant

## 2018-07-25 VITALS — BP 153/83 | HR 61 | Ht 61.0 in | Wt 128.0 lb

## 2018-07-25 DIAGNOSIS — R350 Frequency of micturition: Secondary | ICD-10-CM

## 2018-07-25 DIAGNOSIS — E039 Hypothyroidism, unspecified: Secondary | ICD-10-CM | POA: Diagnosis not present

## 2018-07-25 DIAGNOSIS — F0630 Mood disorder due to known physiological condition, unspecified: Secondary | ICD-10-CM | POA: Diagnosis not present

## 2018-07-25 DIAGNOSIS — L84 Corns and callosities: Secondary | ICD-10-CM | POA: Diagnosis not present

## 2018-07-25 LAB — POCT URINALYSIS DIPSTICK
BILIRUBIN UA: NEGATIVE
Blood, UA: NEGATIVE
Glucose, UA: NEGATIVE
Ketones, UA: NEGATIVE
Nitrite, UA: NEGATIVE
PH UA: 6.5 (ref 5.0–8.0)
PROTEIN UA: NEGATIVE
Spec Grav, UA: 1.02 (ref 1.010–1.025)
UROBILINOGEN UA: 0.2 U/dL

## 2018-07-25 NOTE — Progress Notes (Signed)
Subjective:    Patient ID: Judy Russell, female    DOB: 1956-01-29, 63 y.o.   MRN: [id number]  HPI  Pt is a 63 yo female with hypothyroidism who presents to the clinic for follow up and medication refill.   Checking BP at hoome and ranging 140's to 70's. No CP, palpitations, headaches or vision changes.   She would like me to take a look at place on foot. No pain but seems to be getting thicker. Not doing anything to make better.   She has had some urinary frequency for the last few days. No fever, chills, n/v/d, abdominal or flank pain.   .. Active Ambulatory Problems    Diagnosis Date Noted  . Hypothyroidism 09/04/2009  . SEIZURE DISORDER 09/04/2009  . Fibromyalgia 05/05/2015  . 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 Thyroid activity decreased 05/05/2015  . Chronic neck pain 05/05/2015  . Dyspareunia in female 07/07/2015  . 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 Plantar wart of left foot 02/29/2016  . Menopausal symptoms 07/21/2016  . Mood disorder in conditions classified elsewhere 07/21/2016  . Left leg pain 03/18/2017  . Right foot pain 03/18/2017  . History of colon resection 07/09/2017  . History of irregular heartbeat 07/09/2017  . Sinus bradycardia by electrocardiogram 07/09/2017  . Hyperlipidemia 07/11/2017  . Elevated blood pressure reading 07/11/2017  . Memory changes 07/19/2017  . 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 Perennial allergic rhinitis 08/13/2017  . CFS (chronic fatigue syndrome) 11/27/2017  . Multiple food allergies 03/25/2018  . Environmental allergies 03/25/2018  . Callus of foot 07/29/2018   Resolved Ambulatory Problems    Diagnosis Date Noted  . URI 09/04/2009  . Acute bronchitis 05/03/2016  . Essential hypertension 07/19/2017   Past Medical History:  Diagnosis Date  . Allergy   . HSV-2 (herpes simplex virus 2) infection   . Seizures (HCC)   . Thyroid disease       Review of Systems See HPI.      Objective:   Physical Exam Vitals signs reviewed.  Constitutional:      Appearance: Normal appearance.  HENT:     Head: Normocephalic and atraumatic.  Cardiovascular:     Rate and Rhythm: Normal rate and regular rhythm.     Pulses: Normal pulses.     Heart sounds: Normal heart sounds.  Pulmonary:     Effort: Pulmonary effort is normal.     Breath sounds: Normal breath sounds.  Skin:    Comments: Left ball of plantar foot rough, thick hyperkeratotic skin.   Neurological:     General: No focal deficit present.     Mental Status: She is alert and oriented to person, place, and time.  Psychiatric:        Mood and Affect: Mood normal.        Behavior: Behavior normal.           Assessment & Plan:  Marland KitchenMarland KitchenHajira was seen today for follow-up.  Diagnoses and all orders for this visit:  Acquired hypothyroidism -     TSH  Mood disorder in conditions classified elsewhere  Urinary frequency -     POCT Urinalysis Dipstick -     Urine Culture  Callus of foot   .[name] Results for orders placed or performed in visit on 07/25/18  Urine Culture  Result Value Ref Range   MICRO NUMBER: [id number]    SPECIMEN QUALITY: Adequate    Sample Source NOT GIVEN    STATUS: FINAL  Result: No Growth   POCT Urinalysis Dipstick  Result Value Ref Range   Color, UA yellow    Clarity, UA clear    Glucose, UA Negative Negative   Bilirubin, UA negative    Ketones, UA negative    Spec Grav, UA 1.020 1.010 - 1.025   Blood, UA negative    pH, UA 6.5 5.0 - 8.0   Protein, UA Negative Negative   Urobilinogen, UA 0.2 0.2 or 1.0 E.U./dL   Nitrite, UA negative    Leukocytes, UA Trace (A) Negative   Appearance     Odor     Trace of leukocytes. Will culture. Cannot tolerate pyridium. Discussed lots of hydration this weekend. Will call with culture results.   Will get TSH and adjust medications as needed.   Discussed callus of foot with epson water soaks and pummel stone exfoliation. Wear good  supportive shoes and keep moisturzed. Follow up if becoming painful.

## 2018-07-26 LAB — URINE CULTURE
MICRO NUMBER:: 95761
Result:: NO GROWTH
SPECIMEN QUALITY: ADEQUATE

## 2018-07-28 NOTE — Progress Notes (Signed)
No growth on urine culture.

## 2018-07-29 ENCOUNTER — Encounter: Payer: Self-pay | Admitting: Physician Assistant

## 2018-07-29 ENCOUNTER — Ambulatory Visit (INDEPENDENT_AMBULATORY_CARE_PROVIDER_SITE_OTHER): Payer: Medicare Other | Admitting: Physician Assistant

## 2018-07-29 VITALS — BP 163/70 | HR 57 | Temp 97.8°F | Wt 128.0 lb

## 2018-07-29 DIAGNOSIS — J358 Other chronic diseases of tonsils and adenoids: Secondary | ICD-10-CM | POA: Diagnosis not present

## 2018-07-29 DIAGNOSIS — L84 Corns and callosities: Secondary | ICD-10-CM | POA: Insufficient documentation

## 2018-07-30 ENCOUNTER — Encounter: Payer: Self-pay | Admitting: Physician Assistant

## 2018-07-30 DIAGNOSIS — J358 Other chronic diseases of tonsils and adenoids: Secondary | ICD-10-CM | POA: Insufficient documentation

## 2018-07-30 LAB — TSH: TSH: 0.37 mIU/L — ABNORMAL LOW (ref 0.40–4.50)

## 2018-07-30 MED ORDER — LEVOTHYROXINE SODIUM 112 MCG PO TABS: 112.0000 ug | ORAL_TABLET | Freq: Every day | ORAL | 1 refills | Status: DC

## 2018-07-30 NOTE — Progress Notes (Signed)
   Subjective:    Patient ID: Judy Russell, female    DOB: December 19, 1955, 63 y.o.   MRN: [id number]  HPI: This is a 63 year old female who presents to the clinic with a chief complaint of a "throat abscess". She states she had a feeling of something stuck in her throat since Sunday night and she had her husband look and he said it looked like an abscess and that she better go to the clinic. She denies sore throat, pain, fever, rhinorrhea, sinus pressure or pain, muscles aches, fatigue.     Review of Systems  Constitutional: Negative for chills, fatigue and fever.  HENT: Negative for congestion, ear pain, rhinorrhea, sinus pressure, sinus pain and sore throat.   Respiratory: Negative for cough and shortness of breath.   Gastrointestinal: Negative for abdominal pain, nausea and vomiting.  Musculoskeletal: Negative for arthralgias, myalgias and neck pain.  Skin: Negative for rash.       Objective:   Physical Exam Vitals signs reviewed.  Constitutional:      General: She is not in acute distress. HENT:     Head: Normocephalic and atraumatic.     Right Ear: Tympanic membrane, ear canal and external ear normal. There is no impacted cerumen.     Left Ear: Tympanic membrane, ear canal and external ear normal. There is no impacted cerumen.     Nose: Nose normal. No congestion or rhinorrhea.     Mouth/Throat:     Mouth: Mucous membranes are moist.     Pharynx: No oropharyngeal exudate or posterior oropharyngeal erythema.   Eyes:     Extraocular Movements: Extraocular movements intact.     Conjunctiva/sclera: Conjunctivae normal.     Pupils: Pupils are equal, round, and reactive to light.  Neck:     Musculoskeletal: Normal range of motion and neck supple. No muscular tenderness.  Cardiovascular:     Rate and Rhythm: Normal rate.     Pulses: Normal pulses.     Heart sounds: Normal heart sounds.  Pulmonary:     Effort: Pulmonary effort is normal.     Breath sounds: Normal breath sounds.    Lymphadenopathy:     Cervical: No cervical adenopathy.  Skin:    Capillary Refill: Capillary refill takes less than 2 seconds.  Neurological:     Mental Status: She is alert.           Assessment & Plan:  Marland KitchenMarland KitchenDiagnoses and all orders for this visit:  Tonsillith  Removed in office with NS and cotton swab. Patient educated on tonsilliths and how they form. Handout given for prevention and symptomatic care.   Elevated BP today. Pt is monitoring at home.   Marland KitchenHarlon Flor PA-C, have reviewed and agree with the above documentation in it's entirety.

## 2018-07-30 NOTE — Progress Notes (Signed)
TSH is still too suppressed. Decreased down to daily. Recheck labs in 6 weeks.

## 2018-07-30 NOTE — Addendum Note (Signed)
Addended by: Jomarie Longs on: 07/30/2018 12:47 PM   Modules accepted: Orders

## 2018-07-31 NOTE — Congregational Nurse Program (Signed)
  Dept: [phone]   Congregational Nurse Program Note  Date of Encounter: 07/31/2018  Past Medical History: Past Medical History:  Diagnosis Date  . Allergy   . Fibromyalgia   . HSV-2 (herpes simplex virus 2) infection   . Seizures (HCC)   . Thyroid disease     Encounter Details: CNP Questionnaire - 07/05/18 1245      Questionnaire   Patient Status  Not Applicable    Race  White or Caucasian    Location Patient Served At  [employer]    Insurance  Not Applicable;Private Insurance    Uninsured  Not Applicable    Food  No food insecurities    Housing/Utilities  Yes, have permanent housing    Transportation  No transportation needs    Interpersonal Safety  Yes, feel physically and emotionally safe where you currently live    Medication  No medication insecurities    Medical Provider  Yes    Referrals  Not Applicable   Has PCP   ED Visit Averted  Not Applicable    Life-Saving Intervention Made  Not Applicable      07/05/18  Dr. has changed her thyroid medication, blood pressure has been going up and down until proper dosage is achieved,sees dr. In another week.  B/P 137/80 Pulse 51 .Keeping record of Blood Pressures for Dr.  Benson Setting [phone].

## 2018-08-21 ENCOUNTER — Encounter: Payer: Self-pay | Admitting: Physician Assistant

## 2018-08-21 ENCOUNTER — Ambulatory Visit (INDEPENDENT_AMBULATORY_CARE_PROVIDER_SITE_OTHER): Payer: Medicare Other | Admitting: Physician Assistant

## 2018-08-21 ENCOUNTER — Other Ambulatory Visit: Payer: Self-pay | Admitting: Physician Assistant

## 2018-08-21 VITALS — BP 159/87 | HR 54 | Ht 61.0 in | Wt 126.0 lb

## 2018-08-21 DIAGNOSIS — M5412 Radiculopathy, cervical region: Secondary | ICD-10-CM | POA: Insufficient documentation

## 2018-08-21 DIAGNOSIS — R030 Elevated blood-pressure reading, without diagnosis of hypertension: Secondary | ICD-10-CM | POA: Diagnosis not present

## 2018-08-21 DIAGNOSIS — F0630 Mood disorder due to known physiological condition, unspecified: Secondary | ICD-10-CM

## 2018-08-21 DIAGNOSIS — M5030 Other cervical disc degeneration, unspecified cervical region: Secondary | ICD-10-CM | POA: Diagnosis not present

## 2018-08-21 MED ORDER — DICLOFENAC SODIUM 75 MG PO TBEC: 75.0000 mg | DELAYED_RELEASE_TABLET | Freq: Two times a day (BID) | ORAL | 2 refills | Status: DC

## 2018-08-21 NOTE — Progress Notes (Signed)
Subjective:    Patient ID: Judy Russell, female    DOB: [birth year], 63 y.o.   MRN: [id number]  HPI  Pt is a 63 yo female with hx of cervical degenerative disc disease, hypothyroidism who presents to the clinic with neck pain and right arm numbness and tingling. She denies any weakness of extremities. Pain and symptoms are intermittent but ongoing for the last for years.  She had xray back in 2018 that showed: IMPRESSION: Worsening of degenerative changes since 2006. Cervical spondylosis, most pronounced at C5-6 and C6-7. Foraminal narrowing bilaterally at C5-6 and on the left at C6-7.  Ibuprofen helps but would like something prescription that could be stronger. She has been going to the chiropractor for years and helping symptoms. Now she only gets relief for a few hours. She uses tens units, posture correcter with some relief.    Pt gets her BP checking weekly by her nurse friend. No CP, palpitations, headaches. On average 115-150 over 70's- high 80's.   .. Active Ambulatory Problems    Diagnosis Date Noted  . Hypothyroidism 09/04/2009  . SEIZURE DISORDER 09/04/2009  . Fibromyalgia 05/05/2015  . 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 Thyroid activity decreased 05/05/2015  . Chronic neck pain 05/05/2015  . Dyspareunia in female 07/07/2015  . 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 Plantar wart of left foot 02/29/2016  . Menopausal symptoms 07/21/2016  . Mood disorder in conditions classified elsewhere 07/21/2016  . Left leg pain 03/18/2017  . Right foot pain 03/18/2017  . History of colon resection 07/09/2017  . History of irregular heartbeat 07/09/2017  . Sinus bradycardia by electrocardiogram 07/09/2017  . Hyperlipidemia 07/11/2017  . Elevated blood pressure reading 07/11/2017  . Memory changes 07/19/2017  . 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 Perennial allergic rhinitis 08/13/2017  . CFS (chronic fatigue syndrome) 11/27/2017  . Multiple food  allergies 03/25/2018  . Environmental allergies 03/25/2018  . Callus of foot 07/29/2018  . Tonsillith 07/30/2018  . DDD (degenerative disc disease), cervical 08/21/2018  . Cervical radiculopathy 08/21/2018   Resolved Ambulatory Problems    Diagnosis Date Noted  . URI 09/04/2009  . Acute bronchitis 05/03/2016  . Essential hypertension 07/19/2017   Past Medical History:  Diagnosis Date  . Allergy   . HSV-2 (herpes simplex virus 2) infection   . Seizures (HCC)   . Thyroid disease       Review of Systems  All other systems reviewed and are negative.      Objective:   Physical Exam Vitals signs reviewed.  Constitutional:      Appearance: Normal appearance.  HENT:     Head: Normocephalic and atraumatic.  Cardiovascular:     Rate and Rhythm: Normal rate and regular rhythm.     Pulses: Normal pulses.  Pulmonary:     Effort: Pulmonary effort is normal.  Musculoskeletal:     Comments: No tenderness over the cervical spine.  Tenderness over right sided paraspinal cervical muscles to palpation.  5/5 upper ext strength.  5/5 hand grip.  Positive spurlings sign to the right.   Neurological:     General: No focal deficit present.     Mental Status: She is alert and oriented to person, place, and time.  Psychiatric:        Mood and Affect: Mood normal.           Assessment & Plan:  Marland KitchenMarland KitchenKarleen was seen today for neck pain and shoulder pain.  Diagnoses and all orders  for this visit:  DDD (degenerative disc disease), cervical -     diclofenac (VOLTAREN) 75 MG EC tablet; Take 1 tablet (75 mg total) by mouth 2 (two) times daily.  Elevated blood pressure reading  Cervical radiculopathy -     diclofenac (VOLTAREN) 75 MG EC tablet; Take 1 tablet (75 mg total) by mouth 2 (two) times daily.   Pt brings in log. Most under goal of 150/90. Keep monitoring. Once we get TSH corrected and pain controlled will continue to monitor.   Stop ibuprofen. Given diclofenac to try.  Continues with symptomatic care. Discussed neck pain and likely disc involvement. She has been doing a lot of PT with chiropractic services. She would like to hold off on PT in building. HO with exercises given. I do think time for MRI. She wants to hold off and will have to be open MRI when done. She is very claustrophobic. Follow up as needed.   [name].Spent 30 minutes with patient and greater than 50 percent of visit spent counseling patient regarding treatment plan.

## 2018-09-06 NOTE — Congregational Nurse Program (Unsigned)
  Dept: [phone]   Congregational Nurse Program Note  Date of Encounter: 09/06/2018  Past Medical History: Past Medical History:  Diagnosis Date  . Allergy   . Fibromyalgia   . HSV-2 (herpes simplex virus 2) infection   . Seizures (HCC)   . Thyroid disease     Encounter Details: CNP Questionnaire - 09/06/18 1145      Questionnaire   Patient Status  Not Applicable    Race  White or Caucasian    Location Patient Served At  [employer]    Uninsured  Not Applicable    Food  No food insecurities    Housing/Utilities  Yes, have permanent housing    Transportation  No transportation needs    Interpersonal Safety  Yes, feel physically and emotionally safe where you currently live    Medication  No medication insecurities    Medical Provider  Yes    Referrals  Not Applicable    ED Visit Averted  Not Applicable     09/06/2018 11:45  B/P check 161/91, Pulse 52 Weight 123 Pounds.  B/P and weight going up and down.  Dr. Eather Colas of this. Keeping check on .  To see her Dr. Next week.  Her Thyroid medication was changed.  Cecilie Kicks, RN

## 2018-09-16 ENCOUNTER — Other Ambulatory Visit: Payer: Self-pay | Admitting: Physician Assistant

## 2018-09-16 DIAGNOSIS — E039 Hypothyroidism, unspecified: Secondary | ICD-10-CM

## 2018-09-16 NOTE — Progress Notes (Signed)
Labs ordered per last labs from PCP. Patient will get labs before her medications is out.

## 2018-09-19 ENCOUNTER — Other Ambulatory Visit: Payer: Self-pay | Admitting: Physician Assistant

## 2018-09-19 LAB — TSH: TSH: 0.69 m[IU]/L (ref 0.40–4.50)

## 2018-09-20 ENCOUNTER — Other Ambulatory Visit: Payer: Self-pay

## 2018-09-20 DIAGNOSIS — F0630 Mood disorder due to known physiological condition, unspecified: Secondary | ICD-10-CM

## 2018-09-20 MED ORDER — LEVOTHYROXINE SODIUM 112 MCG PO TABS: 112.0000 ug | ORAL_TABLET | Freq: Every day | ORAL | 0 refills | Status: DC

## 2018-09-20 MED ORDER — LAMOTRIGINE 200 MG PO TABS: 200.0000 mg | ORAL_TABLET | Freq: Every day | ORAL | 0 refills | Status: DC

## 2018-09-20 NOTE — Progress Notes (Signed)
Call pt: back in to normal range. How do you feel? Do you need refills?

## 2018-09-20 NOTE — Addendum Note (Signed)
Addended by: Jed Limerick on: 09/20/2018 10:36 AM   Modules accepted: Orders

## 2018-09-20 NOTE — Telephone Encounter (Signed)
Per pt request, cancelled mail order RX to mail order, sent to local pharmacy

## 2018-10-30 ENCOUNTER — Encounter: Payer: Self-pay | Admitting: Physician Assistant

## 2018-10-30 ENCOUNTER — Ambulatory Visit (INDEPENDENT_AMBULATORY_CARE_PROVIDER_SITE_OTHER): Payer: Medicare Other | Admitting: Physician Assistant

## 2018-10-30 ENCOUNTER — Telehealth: Payer: Self-pay | Admitting: Neurology

## 2018-10-30 VITALS — BP 117/67 | HR 57 | Temp 97.6°F | Ht 61.0 in | Wt 125.0 lb

## 2018-10-30 DIAGNOSIS — K5909 Other constipation: Secondary | ICD-10-CM | POA: Diagnosis not present

## 2018-10-30 MED ORDER — LINACLOTIDE 145 MCG PO CAPS: 145.0000 ug | ORAL_CAPSULE | Freq: Every day | ORAL | 0 refills | Status: DC

## 2018-10-30 NOTE — Telephone Encounter (Signed)
Patient called and left vm that Linzess is too expensive for her ($100). She is on [facility] Medicare so she can not use Linzess copay card. She is asking for alternative medication.   Jade - please advise?

## 2018-10-30 NOTE — Progress Notes (Signed)
Patient having issues with constipation. She states every 8 months she will have issues with large stool that can not be expelled. It takes 1.5-2 hours for her to clear the stool. She feels like after this latest time that some skin came out with it and she is worried. After these episodes she gets really cold and has chills. She is taking miralax, following a gluten free diet, and drinking plenty of water.

## 2018-10-30 NOTE — Progress Notes (Signed)
Patient ID: Judy BoVivian Russell, female   DOB: 1956/01/17, 63 y.o.   MRN: [id number] .[name].Virtual Visit via Video Note  I connected with Judy Russell on 10/31/18 at  9:30 AM EDT by a video enabled telemedicine application and verified that I am speaking with the correct person using two identifiers.   I discussed the limitations of evaluation and management by telemedicine and the availability of in person appointments. The patient expressed understanding and agreed to proceed.  History of Present Illness: Pt is a 63 yo female with hx of constipation and hypothyroidism who calls into the clinic with worsening constipation. Her normal bowel movements are every 2 days and hard to firm but minimal straining. She takes miralax every day. For the last 8 months she has been having more "episodes" of constipation. During episodes can take 2 hours for a stool to pass with a lot of straining, blood, chills. This last time she saw a "jelly like flesh colored substance" come out and it worried her. She is keeping to a gluten free diet and drinking a lot of water. She denies any melena. She reports intermittent hematchezia with straining bowel movements.   .. Active Ambulatory Problems    Diagnosis Date Noted  . Hypothyroidism 09/04/2009  . SEIZURE DISORDER 09/04/2009  . Fibromyalgia 05/05/2015  . 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 Thyroid activity decreased 05/05/2015  . Chronic neck pain 05/05/2015  . Dyspareunia in female 07/07/2015  . 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 Plantar wart of left foot 02/29/2016  . Menopausal symptoms 07/21/2016  . Mood disorder in conditions classified elsewhere 07/21/2016  . Left leg pain 03/18/2017  . Right foot pain 03/18/2017  . History of colon resection 07/09/2017  . History of irregular heartbeat 07/09/2017  . Sinus bradycardia by electrocardiogram 07/09/2017  . Hyperlipidemia 07/11/2017  . Elevated blood pressure reading 07/11/2017  .  Memory changes 07/19/2017  . 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 Perennial allergic rhinitis 08/13/2017  . CFS (chronic fatigue syndrome) 11/27/2017  . Multiple food allergies 03/25/2018  . Environmental allergies 03/25/2018  . Callus of foot 07/29/2018  . Tonsillith 07/30/2018  . DDD (degenerative disc disease), cervical 08/21/2018  . Cervical radiculopathy 08/21/2018  . Chronic constipation 10/30/2018   Resolved Ambulatory Problems    Diagnosis Date Noted  . URI 09/04/2009  . Acute bronchitis 05/03/2016  . Essential hypertension 07/19/2017   Past Medical History:  Diagnosis Date  . Allergy   . HSV-2 (herpes simplex virus 2) infection   . Seizures (HCC)   . Thyroid disease    Reviewed med, allergy, problem list.      Observations/Objective: No acute distress Normal breathing.  Normal mood.   .. Today's Vitals   10/30/18 0901  BP: 117/67  Pulse: (!) 57  Temp: 97.6 F (36.4 C)  TempSrc: Oral  Weight: 125 lb (56.7 kg)  Height: 5\' 1"  (1.549 m)   Body mass index is 23.62 kg/m.   Assessment and Plan: [name].[name].Judy Russell was seen today for constipation.  Diagnoses and all orders for this visit:  Chronic constipation -     linaclotide (LINZESS) 145 MCG CAPS capsule; Take 1 capsule (145 mcg total) by mouth daily before breakfast.  discussed constipation and what stools should look like and frequency. She is taking miralax daily I think it is time for daily medication. Discussed options. Will try linzess. Stop miralax. Discussed to take 30 minutes for eating. Discussed other conservative measures to help with constipation. Reassured her the "  tissue like substance" that came out in BM likely was not part of her colon. Will continue to monitor. If notice any black tarry or bright red blood in stool please let us know. cologuard negative 2019. No colonoscopy. Will consider referral if we cannot resolve issue.    Follow Up Instructions:    I discussed the assessment and  treatment plan with the patient. The patient was provided an opportunity to ask questions and all were answered. The patient agreed with the plan and demonstrated an understanding of the instructions.   The patient was advised to call back or seek an in-person evaluation if the symptoms worsen or if the condition fails to improve as anticipated.  I provided 25 minutes of non-face-to-face time during this encounter.   Tandy Gaw, PA-C

## 2018-10-31 MED ORDER — LUBIPROSTONE 24 MCG PO CAPS: 24.0000 ug | ORAL_CAPSULE | Freq: Two times a day (BID) | ORAL | 2 refills | Status: DC

## 2018-10-31 NOTE — Telephone Encounter (Signed)
Patient left VM today stating that cost of medication was $177. States she will just continue with Miralax and Colace if she has to. Please advise

## 2018-10-31 NOTE — Telephone Encounter (Signed)
I called the pharmacy to ask if a PA was needed and its based off the insurance and she has a high deductible.  Prior authorization would not be needed.

## 2018-10-31 NOTE — Telephone Encounter (Signed)
Francesco Runner can we reach out to insurance and see if they cover any chronic constipation medications? Trulance, linzess, amitiza

## 2018-10-31 NOTE — Telephone Encounter (Signed)
Ok sent amitiza to try but will be twice a day. We may need to call insurance and see what is on her preferred list. Medicare cannot use coupon cards.

## 2018-10-31 NOTE — Telephone Encounter (Signed)
I called pt to let her know that we are looking into this and she wanted to let us know she has new insurance..   I asked pt for the info and she states she was just sitting down for something to eat and did not want to go look for the card at this time.   Francesco Runner, she wants to know if you can contact her for the insurance info after lunch today or tomorrow morning and then call to see what is covered.   Sorry for inconvenience- thanks for your help!

## 2018-10-31 NOTE — Telephone Encounter (Signed)
Patient made aware. She will let us know if medication is too expensive.

## 2018-10-31 NOTE — Telephone Encounter (Signed)
Patient left msg with updated insurance information so that we could make sure we had the correct info when we call to see what was affordable.. I forwarded this message to you, Francesco Runner.   Thanks!

## 2018-11-01 NOTE — Telephone Encounter (Signed)
Left brief VM for patient with PCP recommendations about note below. Patient was asked to call back with any questions.

## 2018-11-01 NOTE — Telephone Encounter (Signed)
Until then she can do mirilax 1capful twice a day in 4oz of juice with colace.

## 2018-11-08 ENCOUNTER — Other Ambulatory Visit: Payer: Self-pay | Admitting: Physician Assistant

## 2018-11-08 ENCOUNTER — Telehealth: Payer: Self-pay | Admitting: Neurology

## 2018-11-08 DIAGNOSIS — R634 Abnormal weight loss: Secondary | ICD-10-CM

## 2018-11-08 NOTE — Telephone Encounter (Signed)
Spoke with patient. She states she is eating a lot, not more active than usual. She was made aware about her thyroid levels. She isn't worried but her husband is starting to worry. She is still hungry and eating normal but has lost 45 lbs since November. She is reaching out because of her husband's worry over it and whether she should have labs checked.

## 2018-11-08 NOTE — Telephone Encounter (Signed)
Basic labs should include a CBC with differential, urinalysis, renal panel, calcium, liver panel, fasting glucose, fecal occult blood test (FOBT), erythrocyte sedimentation rate (ESR), thyroid-stimulating hormone (TSH), HIV and chest radiograph.   You are due for pap smear.   Lets start with labs and if we need to move forward with CXR, UA and FOBT we can. You just had cologuard so that is reassuring.   If I could bet money likely your low normal TSH levels. If everything else checks out we can back more off thyroid medication and see if helps.

## 2018-11-08 NOTE — Telephone Encounter (Signed)
Patient left vm stating the miralax/dulcolax combination is working well for her. BP today was 137/70. Her wgt was 120 lb this week, down 5 lbs in one week. She is concerned about why she keeps losing weight and wants to know if she should have labs to check into this? Please advise.

## 2018-11-08 NOTE — Telephone Encounter (Signed)
I am glad your bowels are moving well. Are you not hungry? How much are you eating? Are you more active? Your thyroid was just check and low normal but normal. You are on the hyper side of the thyroid range.

## 2018-11-09 ENCOUNTER — Other Ambulatory Visit: Payer: Self-pay | Admitting: Physician Assistant

## 2018-11-09 DIAGNOSIS — M5030 Other cervical disc degeneration, unspecified cervical region: Secondary | ICD-10-CM

## 2018-11-09 DIAGNOSIS — M5412 Radiculopathy, cervical region: Secondary | ICD-10-CM

## 2018-11-11 NOTE — Telephone Encounter (Signed)
Patient made aware. She will have labs drawn tomorrow. Labs already entered by [facility].

## 2018-11-13 LAB — CBC WITH DIFFERENTIAL/PLATELET
Absolute Monocytes: 343 cells/uL (ref 200–950)
Basophils Absolute: 29 cells/uL (ref 0–200)
Basophils Relative: 0.6 %
Eosinophils Absolute: 113 cells/uL (ref 15–500)
Eosinophils Relative: 2.3 %
HCT: 41.1 % (ref 35.0–45.0)
Hemoglobin: 13.8 g/dL (ref 11.7–15.5)
Lymphs Abs: 1485 cells/uL (ref 850–3900)
MCH: 29.6 pg (ref 27.0–33.0)
MCHC: 33.6 g/dL (ref 32.0–36.0)
MCV: 88 fL (ref 80.0–100.0)
MPV: 9.4 fL (ref 7.5–12.5)
Monocytes Relative: 7 %
Neutro Abs: 2930 cells/uL (ref 1500–7800)
Neutrophils Relative %: 59.8 %
Platelets: 346 10*3/uL (ref 140–400)
RBC: 4.67 10*6/uL (ref 3.80–5.10)
RDW: 12.9 % (ref 11.0–15.0)
Total Lymphocyte: 30.3 %
WBC: 4.9 10*3/uL (ref 3.8–10.8)

## 2018-11-13 LAB — COMPLETE METABOLIC PANEL WITH GFR
AG Ratio: 2.5 (calc) (ref 1.0–2.5)
ALT: 26 U/L (ref 6–29)
AST: 34 U/L (ref 10–35)
Albumin: 4.5 g/dL (ref 3.6–5.1)
Alkaline phosphatase (APISO): 71 U/L (ref 37–153)
BUN/Creatinine Ratio: 19 (calc) (ref 6–22)
BUN: 19 mg/dL (ref 7–25)
CO2: 26 mmol/L (ref 20–32)
Calcium: 9.6 mg/dL (ref 8.6–10.4)
Chloride: 107 mmol/L (ref 98–110)
Creat: 1.01 mg/dL — ABNORMAL HIGH (ref 0.50–0.99)
GFR, Est African American: 70 mL/min/{1.73_m2} (ref >=60)
GFR, Est Non African American: 60 mL/min/{1.73_m2} (ref >=60)
Globulin: 1.8 g/dL (calc) — ABNORMAL LOW (ref 1.9–3.7)
Glucose, Bld: 84 mg/dL (ref 65–99)
Potassium: 4.3 mmol/L (ref 3.5–5.3)
Sodium: 142 mmol/L (ref 135–146)
Total Bilirubin: 0.3 mg/dL (ref 0.2–1.2)
Total Protein: 6.3 g/dL (ref 6.1–8.1)

## 2018-11-13 LAB — HIV ANTIBODY (ROUTINE TESTING W REFLEX): HIV 1&2 Ab, 4th Generation: NONREACTIVE

## 2018-11-13 LAB — TSH: TSH: 1.52 mIU/L (ref 0.40–4.50)

## 2018-11-13 LAB — SEDIMENTATION RATE: Sed Rate: 2 mm/h (ref 0–30)

## 2018-11-13 NOTE — Telephone Encounter (Signed)
No anemia. WBC look good. Kidney function is up just a hair. GFR still in good range. Globulin low (protein) usually means not eating enough protein.  Liver and thyroid look good.   In one month lets get another weight and see if you have dropped any. We can also do pap and mammogram at that time. We can also check your kidney function to make sure staying stable. Make sure staying hydrated. Keep calorie count up and keep a diary of your food to bring in.   If losing more than 3lbs in a week come in sooner.

## 2018-12-13 ENCOUNTER — Telehealth: Payer: Self-pay | Admitting: Neurology

## 2018-12-13 NOTE — Telephone Encounter (Signed)
Patient cancelled her follow up appt for Monday and left vm stating she is doing well. Her weight is stable between 120-125 lbs. Blood pressure 127-135/69-72 pulse around 55. She will call back with any further concerns.   [place].

## 2018-12-16 ENCOUNTER — Ambulatory Visit: Payer: Medicare Other | Admitting: Physician Assistant

## 2018-12-23 ENCOUNTER — Telehealth: Payer: Self-pay | Admitting: Neurology

## 2018-12-23 MED ORDER — LEVOTHYROXINE SODIUM 112 MCG PO TABS: 112.0000 ug | ORAL_TABLET | Freq: Every day | ORAL | 2 refills | Status: DC

## 2018-12-23 NOTE — Telephone Encounter (Signed)
Patient left vm asking for Levothyroxine 112 mcg to be sent to [facility]. Last TSH 11/2018 good. RX sent has requested.

## 2019-01-07 ENCOUNTER — Other Ambulatory Visit: Payer: Self-pay | Admitting: Neurology

## 2019-01-07 DIAGNOSIS — F0630 Mood disorder due to known physiological condition, unspecified: Secondary | ICD-10-CM

## 2019-01-07 MED ORDER — LAMOTRIGINE 200 MG PO TABS: 200.0000 mg | ORAL_TABLET | Freq: Every day | ORAL | 0 refills | Status: DC

## 2019-01-07 NOTE — Telephone Encounter (Signed)
Patient left vm requesting refill of Lamictal. RX sent.

## 2019-01-28 ENCOUNTER — Telehealth: Payer: Self-pay | Admitting: Neurology

## 2019-01-28 NOTE — Telephone Encounter (Signed)
Called patient and she states she is tired all the time. She thinks it is from [place] in her medication. She states her allergy to corn is feeling tired and she "can't think straight". She has already made appt for tomorrow to talk with [facility].   [place].

## 2019-01-28 NOTE — Telephone Encounter (Signed)
Patient left vm stating that Levothyroxine has cornstarch in it and she is highly allergic to corn. She states she can not take the medication.   It looks like she has been on this medication since 2017. Will call to see if she has had a reaction.

## 2019-01-29 ENCOUNTER — Other Ambulatory Visit: Payer: Self-pay

## 2019-01-29 ENCOUNTER — Ambulatory Visit (INDEPENDENT_AMBULATORY_CARE_PROVIDER_SITE_OTHER): Payer: Medicare Other | Admitting: Physician Assistant

## 2019-01-29 ENCOUNTER — Encounter: Payer: Self-pay | Admitting: Physician Assistant

## 2019-01-29 VITALS — BP 139/88 | HR 52 | Temp 98.3°F | Ht 60.0 in | Wt 121.0 lb

## 2019-01-29 DIAGNOSIS — Z9189 Other specified personal risk factors, not elsewhere classified: Secondary | ICD-10-CM

## 2019-01-29 DIAGNOSIS — E039 Hypothyroidism, unspecified: Secondary | ICD-10-CM

## 2019-01-29 DIAGNOSIS — M151 Heberden's nodes (with arthropathy): Secondary | ICD-10-CM | POA: Insufficient documentation

## 2019-01-29 DIAGNOSIS — Z91018 Allergy to other foods: Secondary | ICD-10-CM | POA: Diagnosis not present

## 2019-01-29 MED ORDER — TIROSINT 112 MCG PO CAPS: 1.0000 | ORAL_CAPSULE | Freq: Every day | ORAL | 1 refills | Status: DC

## 2019-01-29 NOTE — Progress Notes (Signed)
Subjective:    Patient ID: Judy Russell, female    DOB: 1956/04/30, 63 y.o.   MRN: [id number]  HPI  Pt is a 63 yo female with hypothyroidism, mood disorder, fibromyalgia and corn containing allergy. She has removed corn from diet and even condiments. She has been feeling better and better. She noticed her synthroid had cornstarch and not thinks that could be causing the remainder of her fatigue/aches and pains. She has been on for years. She would like to change and see if she feels better.   Overall mood is great. She is well controlled on lamictal.   .. Active Ambulatory Problems    Diagnosis Date Noted  . Hypothyroidism 09/04/2009  . SEIZURE DISORDER 09/04/2009  . Fibromyalgia 05/05/2015  . 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 Thyroid activity decreased 05/05/2015  . Chronic neck pain 05/05/2015  . Dyspareunia in female 07/07/2015  . 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 Plantar wart of left foot 02/29/2016  . Menopausal symptoms 07/21/2016  . Mood disorder in conditions classified elsewhere 07/21/2016  . Left leg pain 03/18/2017  . Right foot pain 03/18/2017  . History of colon resection 07/09/2017  . History of irregular heartbeat 07/09/2017  . Sinus bradycardia by electrocardiogram 07/09/2017  . Hyperlipidemia 07/11/2017  . Elevated blood pressure reading 07/11/2017  . Memory changes 07/19/2017  . 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 Perennial allergic rhinitis 08/13/2017  . CFS (chronic fatigue syndrome) 11/27/2017  . Multiple food allergies 03/25/2018  . Environmental allergies 03/25/2018  . Callus of foot 07/29/2018  . Tonsillith 07/30/2018  . DDD (degenerative disc disease), cervical 08/21/2018  . Cervical radiculopathy 08/21/2018  . Chronic constipation 10/30/2018  . Food allergy 01/29/2019  . 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   Resolved Ambulatory Problems    Diagnosis Date Noted  . URI 09/04/2009  . Acute bronchitis 05/03/2016  .  Essential hypertension 07/19/2017   Past Medical History:  Diagnosis Date  . Allergy   . HSV-2 (herpes simplex virus 2) infection   . Seizures ([place])   . Thyroid disease       Review of Systems  All other systems reviewed and are negative.      Objective:   Physical Exam Vitals signs reviewed.  Constitutional:      Appearance: Normal appearance.  HENT:     Head: Normocephalic.  Cardiovascular:     Rate and Rhythm: Normal rate and regular rhythm.  Pulmonary:     Effort: Pulmonary effort is normal.     Breath sounds: Normal breath sounds.  Neurological:     General: No focal deficit present.     Mental Status: She is alert and oriented to person, place, and time.  Psychiatric:        Mood and Affect: Mood normal.        Behavior: Behavior normal.           Assessment & Plan:  Marland KitchenMarland KitchenSalinda was seen today for medication problem.  Diagnoses and all orders for this visit:  Food allergy  Acquired hypothyroidism -     Levothyroxine Sodium (TIROSINT) 112 MCG CAPS; Take 1 capsule (112 mcg total) by mouth daily before breakfast.  At risk for allergic reaction to medication   [facility]int [facility]. Will replace levothyroxine(synthroid) due to ingredients of corn starch. Pt believes she has been reacting to it for years and causing her fatigue/inflammation. Discuss IF 16:8 also to help inflammation in body from RA. Follow up in 8 weeks.

## 2019-01-31 ENCOUNTER — Telehealth: Payer: Self-pay | Admitting: Physician Assistant

## 2019-01-31 NOTE — Telephone Encounter (Signed)
Received fax from [employer] that Tirosint requires a PA. Information has been sent to the insurance company. Awaiting determination.

## 2019-02-04 NOTE — Telephone Encounter (Signed)
Received a fax from [facility] that [place] was approved through 07/03/2019. Pharmacy aware and from sent to scan. Patient is aware as well.

## 2019-02-05 MED ORDER — THYROID 60 MG PO TABS: 60.0000 mg | ORAL_TABLET | Freq: Every day | ORAL | 1 refills | Status: DC

## 2019-02-05 NOTE — Addendum Note (Signed)
Addended by: Donella Stade on: 02/05/2019 03:11 PM   Modules accepted: Orders

## 2019-02-05 NOTE — Telephone Encounter (Signed)
Patient left vm stating even though approved medication is over $300 a month.   Jade - please advise.

## 2019-02-05 NOTE — Telephone Encounter (Signed)
Sent reminder to have thyroid checked in 4-6 weeks.

## 2019-02-05 NOTE — Telephone Encounter (Signed)
Can we see if any coupons online? That is really the only option.   Armour thyroid(pig thyroid) has sodium starch in it instead of corn starch we could try that.   Thoughts?

## 2019-02-05 NOTE — Telephone Encounter (Signed)
Coupons that are available seem to all be for [employer].   Patient is agreeable to try Armour Thyroid. Can you please send?

## 2019-02-26 MED ORDER — THYROID 65 MG PO TABS: 65.0000 mg | ORAL_TABLET | Freq: Every day | ORAL | 1 refills | Status: DC

## 2019-02-26 NOTE — Addendum Note (Signed)
Addended byDonella Stade on: 02/26/2019 02:08 PM   Modules accepted: Orders

## 2019-02-26 NOTE — Telephone Encounter (Signed)
I received a fax from [facility] that Armor Thyroid is not covered under the patient's plan. Can we try for something else? Is Petra Kuba Thyroid an option? Please advise.

## 2019-02-26 NOTE — Telephone Encounter (Signed)
Can you call around and see if a pharmacy has nature thyroid? Last time I tried to rx it could not find a pharmacy.

## 2019-02-26 NOTE — Telephone Encounter (Signed)
Ok please let patient know I sent nature thyroid recheck levels in 4-6 weeks.

## 2019-02-26 NOTE — Telephone Encounter (Signed)
I called [facility] and they carry all strengths of nature throid. Please advise.

## 2019-02-27 ENCOUNTER — Other Ambulatory Visit: Payer: Self-pay | Admitting: Physician Assistant

## 2019-02-27 DIAGNOSIS — E039 Hypothyroidism, unspecified: Secondary | ICD-10-CM

## 2019-02-27 NOTE — Telephone Encounter (Signed)
Left brief vm that Judy Russell has sent in nature thyroid to [facility]. Patient was also advised to get labs in 4-6 weeks per PCP recommendation.She was asked to call back with any questions.

## 2019-03-07 NOTE — Congregational Nurse Program (Signed)
  Dept: [phone]   Congregational Nurse Program Note  Date of Encounter: 03/07/2019  Past Medical History: Past Medical History:  Diagnosis Date  . Allergy   . Fibromyalgia   . HSV-2 (herpes simplex virus 2) infection   . Seizures ([place])   . Thyroid disease     Encounter Details: CNP Questionnaire - 03/07/19 1310      Questionnaire   Patient Status  Not Applicable    Race  White or Caucasian    Location Patient Served At  [place]  No food insecurities    Housing/Utilities  Yes, have permanent housing    Transportation  No transportation needs    Interpersonal Safety  Yes, feel physically and emotionally safe where you currently live    Medication  No medication insecurities    Medical Provider  Yes    Referrals  Not Applicable    ED Visit Averted  Not Applicable    Life-Saving Intervention Made  Not Applicable     [id number] 1310 hrs. Want  Blood Pressure checked and weight .  Trying to watch diet and maintain Blood Pressure at good level.  B/P 131/75 Pulse 53 ,Weight 121#. Feel good in good shape now . Marko Plume [phone]

## 2019-03-12 ENCOUNTER — Telehealth: Payer: Self-pay | Admitting: Neurology

## 2019-03-12 NOTE — Telephone Encounter (Signed)
Patient states that insurance not covering alternative thyroid medications and she is okaying with staying on levothyroxine. She has plenty of medication left. FYI.

## 2019-03-12 NOTE — Telephone Encounter (Signed)
Left message on machine for patient to call back.

## 2019-03-12 NOTE — Telephone Encounter (Signed)
Patient left vm for a call back.

## 2019-05-09 ENCOUNTER — Encounter: Payer: Medicare Other | Admitting: Physician Assistant

## 2019-05-13 ENCOUNTER — Encounter: Payer: Self-pay | Admitting: Physician Assistant

## 2019-05-13 ENCOUNTER — Ambulatory Visit (INDEPENDENT_AMBULATORY_CARE_PROVIDER_SITE_OTHER): Payer: Medicare Other | Admitting: Physician Assistant

## 2019-05-13 ENCOUNTER — Other Ambulatory Visit: Payer: Self-pay

## 2019-05-13 VITALS — BP 104/71 | HR 71 | Temp 98.6°F | Ht 60.0 in | Wt 119.0 lb

## 2019-05-13 DIAGNOSIS — Z13220 Encounter for screening for lipoid disorders: Secondary | ICD-10-CM

## 2019-05-13 DIAGNOSIS — Z0000 Encounter for general adult medical examination without abnormal findings: Secondary | ICD-10-CM

## 2019-05-13 DIAGNOSIS — E039 Hypothyroidism, unspecified: Secondary | ICD-10-CM | POA: Diagnosis not present

## 2019-05-13 DIAGNOSIS — L821 Other seborrheic keratosis: Secondary | ICD-10-CM

## 2019-05-13 DIAGNOSIS — Z131 Encounter for screening for diabetes mellitus: Secondary | ICD-10-CM | POA: Diagnosis not present

## 2019-05-13 MED ORDER — LEVOTHYROXINE SODIUM 112 MCG PO TABS: 112.0000 ug | ORAL_TABLET | Freq: Every day | ORAL | 0 refills | Status: DC

## 2019-05-13 NOTE — Patient Instructions (Signed)
Seborrheic Keratosis A seborrheic keratosis is a common, noncancerous (benign) skin growth. These growths are velvety, waxy, rough, tan, brown, or black spots that appear on the skin. These skin growths can be flat or raised, and scaly. What are the causes? The cause of this condition is not known. What increases the risk? You are more likely to develop this condition if you:  Have a family history of seborrheic keratosis.  Are 50 or older.  Are pregnant.  Have had estrogen replacement therapy. What are the signs or symptoms? Symptoms of this condition include growths on the face, chest, shoulders, back, or other areas. These growths:  Are usually painless, but may become irritated and itchy.  Can be yellow, brown, black, or other colors.  Are slightly raised or have a flat surface.  Are sometimes rough or wart-like in texture.  Are often velvety or waxy on the surface.  Are round or oval-shaped.  Often occur in groups, but may occur as a single growth. How is this diagnosed? This condition is diagnosed with a medical history and physical exam.  A sample of the growth may be tested (skin biopsy).  You may need to see a skin specialist (dermatologist). How is this treated? Treatment is not usually needed for this condition, unless the growths are irritated or bleed often.  You may also choose to have the growths removed if you do not like their appearance. ? Most commonly, these growths are treated with a procedure in which liquid nitrogen is applied to "freeze" off the growth (cryosurgery). ? They may also be burned off with electricity (electrocautery) or removed by scraping (curettage). Follow these instructions at home:  Watch your growth for any changes.  Keep all follow-up visits as told by your health care provider. This is important.  Do not scratch or pick at the growth or growths. This can cause them to become irritated or infected. Contact a health care  provider if:  You suddenly have many new growths.  Your growth bleeds, itches, or hurts.  Your growth suddenly becomes larger or changes color. Summary  A seborrheic keratosis is a common, noncancerous (benign) skin growth.  Treatment is not usually needed for this condition, unless the growths are irritated or bleed often.  Watch your growth for any changes.  Contact a health care provider if you suddenly have many new growths or your growth suddenly becomes larger or changes color.  Keep all follow-up visits as told by your health care provider. This is important. This information is not intended to replace advice given to you by your health care provider. Make sure you discuss any questions you have with your health care provider. Document Released: 07/22/2010 Document Revised: 11/01/2017 Document Reviewed: 11/01/2017 Elsevier Patient Education  2020 [employer]   Health Maintenance, Female Adopting a healthy lifestyle and getting preventive care are important in promoting health and wellness. Ask your health care provider about:  The right schedule for you to have regular tests and exams.  Things you can do on your own to prevent diseases and keep yourself healthy. What should I know about diet, weight, and exercise? Eat a healthy diet   Eat a diet that includes plenty of vegetables, fruits, low-fat dairy products, and lean protein.  Do not eat a lot of foods that are high in solid fats, added sugars, or sodium. Maintain a healthy weight Body mass index (BMI) is used to identify weight problems. It estimates body fat based on height and weight.  Your health care provider can help determine your BMI and help you achieve or maintain a healthy weight. Get regular exercise Get regular exercise. This is one of the most important things you can do for your health. Most adults should:  Exercise for at least 150 minutes each week. The exercise should increase your heart rate  and make you sweat (moderate-intensity exercise).  Do strengthening exercises at least twice a week. This is in addition to the moderate-intensity exercise.  Spend less time sitting. Even light physical activity can be beneficial. Watch cholesterol and blood lipids Have your blood tested for lipids and cholesterol at 61 years of age, then have this test every 5 years. Have your cholesterol levels checked more often if:  Your lipid or cholesterol levels are high.  You are older than 61 years of age.  You are at high risk for heart disease. What should I know about cancer screening? Depending on your health history and family history, you may need to have cancer screening at various ages. This may include screening for:  Breast cancer.  Cervical cancer.  Colorectal cancer.  Skin cancer.  Lung cancer. What should I know about heart disease, diabetes, and high blood pressure? Blood pressure and heart disease  High blood pressure causes heart disease and increases the risk of stroke. This is more likely to develop in people who have high blood pressure readings, are of African descent, or are overweight.  Have your blood pressure checked: ? Every 3-5 years if you are 3-71 years of age. ? Every year if you are 34 years old or older. Diabetes Have regular diabetes screenings. This checks your fasting blood sugar level. Have the screening done:  Once every three years after age 38 if you are at a normal weight and have a low risk for diabetes.  More often and at a younger age if you are overweight or have a high risk for diabetes. What should I know about preventing infection? Hepatitis B If you have a higher risk for hepatitis B, you should be screened for this virus. Talk with your health care provider to find out if you are at risk for hepatitis B infection. Hepatitis C Testing is recommended for:  Everyone born from [birth year] through 1963.  Anyone with known risk factors for  hepatitis C. Sexually transmitted infections (STIs)  Get screened for STIs, including gonorrhea and chlamydia, if: ? You are sexually active and are younger than 61 years of age. ? You are older than 61 years of age and your health care provider tells you that you are at risk for this type of infection. ? Your sexual activity has changed since you were last screened, and you are at increased risk for chlamydia or gonorrhea. Ask your health care provider if you are at risk.  Ask your health care provider about whether you are at high risk for HIV. Your health care provider may recommend a prescription medicine to help prevent HIV infection. If you choose to take medicine to prevent HIV, you should first get tested for HIV. You should then be tested every 3 months for as long as you are taking the medicine. Pregnancy  If you are about to stop having your period (premenopausal) and you may become pregnant, seek counseling before you get pregnant.  Take 400 to 800 micrograms (mcg) of folic acid every day if you become pregnant.  Ask for birth control (contraception) if you want to prevent pregnancy. Osteoporosis and menopause Osteoporosis  is a disease in which the bones lose minerals and strength with aging. This can result in bone fractures. If you are 42 years old or older, or if you are at risk for osteoporosis and fractures, ask your health care provider if you should:  Be screened for bone loss.  Take a calcium or vitamin D supplement to lower your risk of fractures.  Be given hormone replacement therapy (HRT) to treat symptoms of menopause. Follow these instructions at home: Lifestyle  Do not use any products that contain nicotine or tobacco, such as cigarettes, e-cigarettes, and chewing tobacco. If you need help quitting, ask your health care provider.  Do not use street drugs.  Do not share needles.  Ask your health care provider for help if you need support or information about  quitting drugs. Alcohol use  Do not drink alcohol if: ? Your health care provider tells you not to drink. ? You are pregnant, may be pregnant, or are planning to become pregnant.  If you drink alcohol: ? Limit how much you use to 0-1 drink a day. ? Limit intake if you are breastfeeding.  Be aware of how much alcohol is in your drink. In the [place] one drink equals one 12 oz bottle of beer (355 mL), one 5 oz glass of wine (148 mL), or one 1 oz glass of hard liquor (44 mL). General instructions  Schedule regular health, dental, and eye exams.  Stay current with your vaccines.  Tell your health care provider if: ? You often feel depressed. ? You have ever been abused or do not feel safe at home. Summary  Adopting a healthy lifestyle and getting preventive care are important in promoting health and wellness.  Follow your health care provider's instructions about healthy diet, exercising, and getting tested or screened for diseases.  Follow your health care provider's instructions on monitoring your cholesterol and blood pressure. This information is not intended to replace advice given to you by your health care provider. Make sure you discuss any questions you have with your health care provider. Document Released: 01/02/2011 Document Revised: 06/12/2018 Document Reviewed: 06/12/2018 Elsevier Patient Education  2020 [employer].

## 2019-05-13 NOTE — Progress Notes (Signed)
Subjective:     Judy Russell is a 63 y.o. female and is here for a comprehensive physical exam. The patient reports no problems.   Social History   Socioeconomic History  . Marital status: Single    Spouse name: Not on file  . Number of children: Not on file  . Years of education: Not on file  . Highest education level: Not on file  Occupational History  . Not on file  Social Needs  . Financial resource strain: Not on file  . Food insecurity    Worry: Not on file    Inability: Not on file  . Transportation needs    Medical: Not on file    Non-medical: Not on file  Tobacco Use  . Smoking status: Never Smoker  . Smokeless tobacco: Never Used  Substance and Sexual Activity  . Alcohol use: No  . Drug use: No  . Sexual activity: Not Currently  Lifestyle  . Physical activity    Days per week: Not on file    Minutes per session: Not on file  . Stress: Not on file  Relationships  . Social Musician on phone: Not on file    Gets together: Not on file    Attends religious service: Not on file    Active member of club or organization: Not on file    Attends meetings of clubs or organizations: Not on file    Relationship status: Not on file  . Intimate partner violence    Fear of current or ex partner: Not on file    Emotionally abused: Not on file    Physically abused: Not on file    Forced sexual activity: Not on file  Other Topics Concern  . Not on file  Social History Narrative  . Not on file   Health Maintenance  Topic Date Due  . PAP SMEAR-Modifier  05/14/2019 (Originally 02/15/1979)  . MAMMOGRAM  07/26/2019 (Originally 02/15/2008)  . TETANUS/TDAP  05/04/2025 (Originally 02/14/1977)  . Fecal DNA (Cologuard)  04/25/2021  . Hepatitis C Screening  Completed  . HIV Screening  Completed  . INFLUENZA VACCINE  Discontinued    The following portions of the patient's history were reviewed and updated as appropriate: allergies, current medications, past family  history, past medical history, past social history, past surgical history and problem list.  Review of Systems A comprehensive review of systems was negative.   Objective:    BP 104/71   Pulse 71   Temp 98.6 F (37 C) (Oral)   Ht 5' (1.524 m)   Wt 119 lb (54 kg)   BMI 23.24 kg/m  General appearance: alert and cooperative Head: Normocephalic, without obvious abnormality, atraumatic Eyes: conjunctivae/corneas clear. PERRL, EOM's intact. Fundi benign. Ears: normal TM's and external ear canals both ears Nose: Nares normal. Septum midline. Mucosa normal. No drainage or sinus tenderness. Throat: lips, mucosa, and tongue normal; teeth and gums normal Neck: no adenopathy, no carotid bruit, no JVD, supple, symmetrical, trachea midline and thyroid not enlarged, symmetric, no tenderness/mass/nodules Back: symmetric, no curvature. ROM normal. No CVA tenderness. Lungs: clear to auscultation bilaterally Breasts: normal appearance, no masses or tenderness Heart: regular rate and rhythm, S1, S2 normal, no murmur, click, rub or gallop Abdomen: soft, non-tender; bowel sounds normal; no masses,  no organomegaly Extremities: extremities normal, atraumatic, no cyanosis or edema Pulses: 2+ and symmetric Skin: 16mm by 58mm circular hyperpigmented verruca like raised lesion.  Lymph nodes: Cervical, supraclavicular, and axillary  nodes normal. Neurologic: Alert and oriented X 3, normal strength and tone. Normal symmetric reflexes. Normal coordination and gait    Assessment:    Healthy female exam.      Plan:      Marland KitchenMarland KitchenShaunae was seen today for annual exam.  Diagnoses and all orders for this visit:  Routine physical examination -     Lipid Panel w/reflex Direct LDL -     TSH -     COMPLETE METABOLIC PANEL WITH GFR  Screening for diabetes mellitus -     COMPLETE METABOLIC PANEL WITH GFR  Acquired hypothyroidism -     levothyroxine (SYNTHROID) 112 MCG tablet; Take 1 tablet (112 mcg total) by mouth  daily. -     TSH  Screening for lipid disorders -     Lipid Panel w/reflex Direct LDL  Seborrheic keratoses   .[name] Depression screen [facility] 2/9 05/13/2019 03/25/2018 07/09/2017  Decreased Interest 0 0 0  Down, Depressed, Hopeless 0 0 0  PHQ - 2 Score 0 0 0  Altered sleeping 0 1 -  Tired, decreased energy 0 0 -  Change in appetite 0 1 -  Feeling bad or failure about yourself  0 2 -  Trouble concentrating 0 0 -  Moving slowly or fidgety/restless 0 0 -  Suicidal thoughts 0 0 -  PHQ-9 Score 0 4 -  Difficult doing work/chores Not difficult at all Somewhat difficult -   .[name] Discussed 150 minutes of exercise a week.  Encouraged vitamin D 1000 units and Calcium 1300mg  or 4 servings of dairy a day.  Fasting labs ordered. Cologuard UTD. Pap declined. Risk discussed.  Flu shot declined.  Shingles vaccine declined.   Reassured patient skin lesion looked like SK. HO given. Continue to watch size and color. If growing or changing follow up and can get biospy.    Pt declined mammogram. Risk discussed.  See After Visit Summary for Counseling Recommendations

## 2019-05-14 ENCOUNTER — Encounter: Payer: Self-pay | Admitting: Physician Assistant

## 2019-05-14 DIAGNOSIS — L821 Other seborrheic keratosis: Secondary | ICD-10-CM | POA: Insufficient documentation

## 2019-05-14 LAB — COMPLETE METABOLIC PANEL WITH GFR
AG Ratio: 2.2 (calc) (ref 1.0–2.5)
ALT: 25 U/L (ref 6–29)
AST: 34 U/L (ref 10–35)
Albumin: 4.4 g/dL (ref 3.6–5.1)
Alkaline phosphatase (APISO): 68 U/L (ref 37–153)
BUN/Creatinine Ratio: 23 (calc) — ABNORMAL HIGH (ref 6–22)
BUN: 23 mg/dL (ref 7–25)
CO2: 25 mmol/L (ref 20–32)
Calcium: 10.2 mg/dL (ref 8.6–10.4)
Chloride: 107 mmol/L (ref 98–110)
Creat: 1.01 mg/dL — ABNORMAL HIGH (ref 0.50–0.99)
GFR, Est African American: 70 mL/min/{1.73_m2} (ref >=60)
GFR, Est Non African American: 60 mL/min/{1.73_m2} (ref >=60)
Globulin: 2 g/dL (calc) (ref 1.9–3.7)
Glucose, Bld: 111 mg/dL — ABNORMAL HIGH (ref 65–99)
Potassium: 3.9 mmol/L (ref 3.5–5.3)
Sodium: 143 mmol/L (ref 135–146)
Total Bilirubin: 0.3 mg/dL (ref 0.2–1.2)
Total Protein: 6.4 g/dL (ref 6.1–8.1)

## 2019-05-14 LAB — LIPID PANEL W/REFLEX DIRECT LDL
Cholesterol: 267 mg/dL — ABNORMAL HIGH (ref <200)
HDL: 67 mg/dL (ref >=50)
LDL Cholesterol (Calc): 166 mg/dL (calc) — ABNORMAL HIGH
Non-HDL Cholesterol (Calc): 200 mg/dL (calc) — ABNORMAL HIGH (ref <130)
Total CHOL/HDL Ratio: 4 (calc) (ref <5.0)
Triglycerides: 186 mg/dL — ABNORMAL HIGH (ref <150)

## 2019-05-14 LAB — TSH: TSH: 3.51 mIU/L (ref 0.40–4.50)

## 2019-05-14 NOTE — Progress Notes (Signed)
Vi,   LDL still elevated but stable. HDL great. You are certainly not to goal LDL but with no other risk factors except age your CV 10 year risk is 2.6 percent under 7.5 to treat.   Your TG and sugar did increase from 1 year ago. You were fasting before labs?   Liver looks good. Creatine up just a tad which is a kidney marker. Stable from last check.   Luvenia Starch

## 2019-05-15 ENCOUNTER — Other Ambulatory Visit: Payer: Self-pay | Admitting: Physician Assistant

## 2019-05-15 DIAGNOSIS — F0630 Mood disorder due to known physiological condition, unspecified: Secondary | ICD-10-CM

## 2019-05-18 ENCOUNTER — Encounter: Payer: Self-pay | Admitting: Physician Assistant

## 2019-05-19 ENCOUNTER — Other Ambulatory Visit: Payer: Self-pay | Admitting: Physician Assistant

## 2019-05-19 ENCOUNTER — Ambulatory Visit (INDEPENDENT_AMBULATORY_CARE_PROVIDER_SITE_OTHER): Payer: Medicare Other | Admitting: Physician Assistant

## 2019-05-19 ENCOUNTER — Other Ambulatory Visit: Payer: Self-pay

## 2019-05-19 ENCOUNTER — Encounter: Payer: Self-pay | Admitting: Physician Assistant

## 2019-05-19 VITALS — Temp 97.5°F | Ht 60.0 in | Wt 119.0 lb

## 2019-05-19 DIAGNOSIS — R5383 Other fatigue: Secondary | ICD-10-CM | POA: Diagnosis not present

## 2019-05-19 DIAGNOSIS — Z20828 Contact with and (suspected) exposure to other viral communicable diseases: Secondary | ICD-10-CM

## 2019-05-19 DIAGNOSIS — R42 Dizziness and giddiness: Secondary | ICD-10-CM

## 2019-05-19 DIAGNOSIS — Z20822 Contact with and (suspected) exposure to covid-19: Secondary | ICD-10-CM

## 2019-05-19 DIAGNOSIS — J029 Acute pharyngitis, unspecified: Secondary | ICD-10-CM | POA: Diagnosis not present

## 2019-05-19 NOTE — Progress Notes (Signed)
Patient ID: Judy Russell, female   DOB: 1955-08-01, 62 y.o.   MRN: [id number] .Marland KitchenVirtual Visit via Video Note  I connected with Judy Russell on 05/19/19 at  1:20 PM EST by a video enabled telemedicine application and verified that I am speaking with the correct person using two identifiers.  Location: Patient: home Provider: clinic   I discussed the limitations of evaluation and management by telemedicine and the availability of in person appointments. The patient expressed understanding and agreed to proceed.  History of Present Illness: Pt is a 63 yo female who calls into the clinic with covid exposure and symptoms. She went to a prayer meeting Tuesday morning and later found out that another person at meeting tested positive for covid. Pt started having symptoms this weekend with dry cough, fatigue, dizziness, ST. She was told to wait 6 days from exposure to get tested. Denies any loss of smell or taste, fever, GI symptoms, SOB.   [name]. Active Ambulatory Problems    Diagnosis Date Noted  . Hypothyroidism 09/04/2009  . SEIZURE DISORDER 09/04/2009  . Fibromyalgia 05/05/2015  . 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 Thyroid activity decreased 05/05/2015  . Chronic neck pain 05/05/2015  . Dyspareunia in female 07/07/2015  . 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 Plantar wart of left foot 02/29/2016  . Menopausal symptoms 07/21/2016  . Mood disorder in conditions classified elsewhere 07/21/2016  . Left leg pain 03/18/2017  . Right foot pain 03/18/2017  . History of colon resection 07/09/2017  . History of irregular heartbeat 07/09/2017  . Sinus bradycardia by electrocardiogram 07/09/2017  . Hyperlipidemia 07/11/2017  . Elevated blood pressure reading 07/11/2017  . Memory changes 07/19/2017  . 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 Perennial allergic rhinitis 08/13/2017  . CFS (chronic fatigue syndrome) 11/27/2017  . Multiple food allergies 03/25/2018  .  Environmental allergies 03/25/2018  . Callus of foot 07/29/2018  . Tonsillith 07/30/2018  . DDD (degenerative disc disease), cervical 08/21/2018  . Cervical radiculopathy 08/21/2018  . Chronic constipation 10/30/2018  . Food allergy 01/29/2019  . 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 Seborrheic keratoses 05/14/2019   Resolved Ambulatory Problems    Diagnosis Date Noted  . URI 09/04/2009  . Acute bronchitis 05/03/2016  . Essential hypertension 07/19/2017   Past Medical History:  Diagnosis Date  . Allergy   . HSV-2 (herpes simplex virus 2) infection   . Seizures (HCC)   . Thyroid disease    Reviewed med, allergy, problem list.     Observations/Objective: No acute distress Normal mood and appearance.  Normal breathing.  Dry cough on encounter.   .. Today's Vitals   05/19/19 1054  Temp: (!) 97.5 F (36.4 C)  TempSrc: Oral  Weight: 119 lb (54 kg)  Height: 5' (1.524 m)   Body mass index is 23.24 kg/m.   Assessment and Plan: Marland KitchenMarland KitchenTeresina was seen today for advice only.  Diagnoses and all orders for this visit:  Sore throat  Close exposure to COVID-19 virus -     Novel Coronavirus, NAA ([facility])  Fatigue, unspecified type -     Novel Coronavirus, NAA ([facility])  Dizziness   Pt is going to come into clinic and do a drive through test. [place] is to far and [facility] does not have appts today. Self isolate until results in. Discussed symptomatic treatment of symptoms with rest, hydration, mucinex. Pt aware with any SOB/Breathing issue that is concerning go to ED. If symptoms worsen but not urgent reach out to our  office for symptom management.    Follow Up Instructions:    I discussed the assessment and treatment plan with the patient. The patient was provided an opportunity to ask questions and all were answered. The patient agreed with the plan and demonstrated an understanding of the instructions.   The patient was advised to call back or seek an in-person evaluation if the  symptoms worsen or if the condition fails to improve as anticipated.     Iran Planas, PA-C

## 2019-05-19 NOTE — Progress Notes (Deleted)
Current symptoms:  Dizziness/lightheaded Nausea Tired Sore throat

## 2019-05-20 ENCOUNTER — Encounter: Payer: Self-pay | Admitting: Physician Assistant

## 2019-05-21 ENCOUNTER — Encounter: Payer: Self-pay | Admitting: Physician Assistant

## 2019-05-22 ENCOUNTER — Encounter: Payer: Self-pay | Admitting: Physician Assistant

## 2019-05-26 ENCOUNTER — Encounter: Payer: Self-pay | Admitting: Physician Assistant

## 2019-05-26 NOTE — Telephone Encounter (Signed)
Patient was advised of her negative results and she is needing a letter for her husband to go back to work. Is it ok to type this up for him on her chart? Please advise.

## 2019-05-26 NOTE — Telephone Encounter (Signed)
Yes ok for husband to go back due to household contacts being negative for covid and asymptomatic.

## 2019-05-28 LAB — NOVEL CORONAVIRUS, NAA: SARS-CoV-2, NAA: NOT DETECTED

## 2019-09-11 ENCOUNTER — Telehealth (INDEPENDENT_AMBULATORY_CARE_PROVIDER_SITE_OTHER): Payer: Medicare Other | Admitting: Family Medicine

## 2019-09-11 ENCOUNTER — Encounter: Payer: Self-pay | Admitting: Family Medicine

## 2019-09-11 ENCOUNTER — Other Ambulatory Visit: Payer: Self-pay

## 2019-09-11 VITALS — BP 128/89 | HR 70 | Temp 99.6°F | Ht 60.0 in | Wt 122.0 lb

## 2019-09-11 DIAGNOSIS — Z20822 Contact with and (suspected) exposure to covid-19: Secondary | ICD-10-CM | POA: Diagnosis not present

## 2019-09-11 NOTE — Progress Notes (Signed)
Judy Russell - 64 y.o. female MRN [id number]  Date of birth: 09-25-1955   This visit type was conducted due to national recommendations for restrictions regarding the COVID-19 Pandemic (e.g. social distancing).  This format is felt to be most appropriate for this patient at this time.  All issues noted in this document were discussed and addressed.  No physical exam was performed (except for noted visual exam findings with Video Visits).  I discussed the limitations of evaluation and management by telemedicine and the availability of in person appointments. The patient expressed understanding and agreed to proceed.  I connected with@ on 09/11/19 at 10:30 AM EST by a video enabled telemedicine application and verified that I am speaking with the correct person using two identifiers.  Present at visit: Everrett Coombe, DO Daleen Bo   Patient Location: Home [address] [place] [address]   Provider location:   Home office  Chief Complaint  Patient presents with  . Covid Exposure    HPI  Judy Russell is a 64 y.o. female who presents via audio/video conferencing for a telehealth visit today.  She reports 2 day history of chills, body aches, congestion and diminished taste and smell.  She has had elevated temp.  She reports that her husband tested positive yesterday.  She has not tried anything for treatment so far.  She is eating and drinking normally.  She denies shortness of breath, worsening fatigue or chest tightness.    ROS:  A comprehensive ROS was completed and negative except as noted per HPI  Past Medical History:  Diagnosis Date  . Allergy   . Fibromyalgia   . HSV-2 (herpes simplex virus 2) infection   . Seizures (HCC)   . Thyroid disease     Past Surgical History:  Procedure Laterality Date  . APPENDECTOMY    . DILATION AND CURETTAGE OF UTERUS    . OOPHORECTOMY Left     Family History  Problem Relation Age of Onset  . Cancer Mother        breast  .  Diabetes Mother   . Alcohol abuse Father   . Hypertension Father   . Stroke Father     Social History   Socioeconomic History  . Marital status: Single    Spouse name: Not on file  . Number of children: Not on file  . Years of education: Not on file  . Highest education level: Not on file  Occupational History  . Not on file  Tobacco Use  . Smoking status: Never Smoker  . Smokeless tobacco: Never Used  Substance and Sexual Activity  . Alcohol use: No  . Drug use: No  . Sexual activity: Not Currently  Other Topics Concern  . Not on file  Social History Narrative  . Not on file   Social Determinants of Health   Financial Resource Strain:   . Difficulty of Paying Living Expenses:   Food Insecurity:   . Worried About Programme researcher, broadcasting/film/video in the Last Year:   . Barista in the Last Year:   Transportation Needs:   . Freight forwarder (Medical):   [name] Lack of Transportation (Non-Medical):   Physical Activity:   . Days of Exercise per Week:   . Minutes of Exercise per Session:   Stress:   . Feeling of Stress :   Social Connections:   . Frequency of Communication with Friends and Family:   . Frequency of Social Gatherings with Friends  and Family:   . Attends Religious Services:   . Active Member of Clubs or Organizations:   . Attends Archivist Meetings:   [name] Marital Status:   Intimate Partner Violence:   . Fear of Current or Ex-Partner:   . Emotionally Abused:   [name] Physically Abused:   . Sexually Abused:      Current Outpatient Medications:  .  diclofenac (VOLTAREN) 75 MG EC tablet, Take 1 tablet (75 mg total) by mouth 2 (two) times daily., Disp: 60 tablet, Rfl: 2 .  lamoTRIgine (LAMICTAL) 200 MG tablet, TAKE 1 TABLET BY MOUTH  DAILY, Disp: 90 tablet, Rfl: 3 .  levothyroxine (SYNTHROID) 112 MCG tablet, Take 1 tablet (112 mcg total) by mouth daily., Disp: 90 tablet, Rfl: 0 .  polyethylene glycol (MIRALAX / GLYCOLAX) 17 g packet, Take 17 g by mouth  daily., Disp: , Rfl:   EXAM:  VITALS per patient if applicable: BP 150/56   Pulse 70   Temp 99.6 F (37.6 C) (Oral)   Ht 5' (1.524 m)   Wt 122 lb (55.3 kg)   BMI 23.83 kg/m   GENERAL: alert, oriented, appears well and in no acute distress  HEENT: atraumatic, conjunttiva clear, no obvious abnormalities on inspection of external nose and ears  NECK: normal movements of the head and neck  LUNGS: on inspection no signs of respiratory distress, breathing rate appears normal, no obvious gross SOB, gasping or wheezing  CV: no obvious cyanosis  MS: moves all visible extremities without noticeable abnormality  PSYCH/NEURO: pleasant and cooperative, no obvious depression or anxiety, speech and thought processing grossly intact  ASSESSMENT AND PLAN:  Discussed the following assessment and plan:  Suspected COVID-19 virus infection She has symptoms consistent with COVID-19 infection with positive close contact.  Recommend testing for COVID, she will stop by clinic. Instructions given.  Recommend self isolation for minimum of 10 days.  Must also have improving symptoms and remain afebrile.  Discussed supportive care at home.  May use OTC medications for congestion and pain/fever as needed.  Increase fluids and rest.  Reviewed red flags including increasing dyspnea or significant fatigue.    25 minutes spent including pre visit preparation, review of prior notes and labs, encounter with patient via video visit and same day documentation.    I discussed the assessment and treatment plan with the patient. The patient was provided an opportunity to ask questions and all were answered. The patient agreed with the plan and demonstrated an understanding of the instructions.   The patient was advised to call back or seek an in-person evaluation if the symptoms worsen or if the condition fails to improve as anticipated.    Luetta Nutting, DO

## 2019-09-11 NOTE — Progress Notes (Signed)
Patient reports that he was positive yesterday,  Patient reports that she is having  Cold, chills, fever, no taste or smell, body aches,.   She reports this started around Tuesday and thought it was allergies.   She has been tested previously and it was negative.   She has not taken any OTC meds and trying to sleep and drinking water.   High temp from 98-99 on and off the past few days.

## 2019-09-11 NOTE — Assessment & Plan Note (Signed)
She has symptoms consistent with COVID-19 infection with positive close contact.  Recommend testing for COVID, she will stop by clinic. Instructions given.  Recommend self isolation for minimum of 10 days.  Must also have improving symptoms and remain afebrile.  Discussed supportive care at home.  May use OTC medications for congestion and pain/fever as needed.  Increase fluids and rest.  Reviewed red flags including increasing dyspnea or significant fatigue.

## 2019-09-11 NOTE — Addendum Note (Signed)
Addended bySilvio Pate on: 09/11/2019 10:56 AM   Modules accepted: Orders

## 2019-09-12 ENCOUNTER — Telehealth: Payer: Medicare Other | Admitting: Physician Assistant

## 2019-09-13 LAB — NOVEL CORONAVIRUS, NAA: SARS-CoV-2, NAA: DETECTED — AB

## 2019-09-13 LAB — SPECIMEN STATUS REPORT

## 2019-09-22 ENCOUNTER — Telehealth (INDEPENDENT_AMBULATORY_CARE_PROVIDER_SITE_OTHER): Payer: Medicare Other | Admitting: Physician Assistant

## 2019-09-22 ENCOUNTER — Other Ambulatory Visit: Payer: Self-pay | Admitting: Physician Assistant

## 2019-09-22 ENCOUNTER — Encounter: Payer: Self-pay | Admitting: Physician Assistant

## 2019-09-22 VITALS — BP 137/89 | HR 66 | Temp 97.0°F | Ht 60.0 in | Wt 119.0 lb

## 2019-09-22 DIAGNOSIS — U071 COVID-19: Secondary | ICD-10-CM

## 2019-09-22 DIAGNOSIS — J40 Bronchitis, not specified as acute or chronic: Secondary | ICD-10-CM

## 2019-09-22 DIAGNOSIS — J329 Chronic sinusitis, unspecified: Secondary | ICD-10-CM | POA: Diagnosis not present

## 2019-09-22 DIAGNOSIS — E039 Hypothyroidism, unspecified: Secondary | ICD-10-CM

## 2019-09-22 MED ORDER — PREDNISONE 20 MG PO TABS: 20.0000 mg | ORAL_TABLET | Freq: Two times a day (BID) | ORAL | 0 refills | Status: DC

## 2019-09-22 MED ORDER — AZITHROMYCIN 250 MG PO TABS: ORAL_TABLET | ORAL | 0 refills | Status: DC

## 2019-09-22 NOTE — Progress Notes (Signed)
Congestion Post nasal drip Headache Fatigue Chills cough  Taking tylenol cold and flu severe and flonase - helps some

## 2019-09-22 NOTE — Progress Notes (Signed)
Patient ID: Judy Russell, female   DOB: 11/26/1955, 64 y.o.   MRN: [id number] .Marland KitchenVirtual Visit via Video Note  I connected with Judy Russell on 09/22/19 at  9:50 AM EDT by a video enabled telemedicine application and verified that I am speaking with the correct person using two identifiers.  Location: Patient: home Provider: clinic   I discussed the limitations of evaluation and management by telemedicine and the availability of in person appointments. The patient expressed understanding and agreed to proceed.  History of Present Illness: Pt is a 64 yo female with covid who follows up on symptoms. She continues to have productive cough, chest tightness, sinus congestion, headache, nasal congestion, fatigue. No fever. Continues to have loss of smell and taste. She denies any significant SOB. She continues to cough and "feels like she cannot get over the hump".    .. Active Ambulatory Problems    Diagnosis Date Noted  . Hypothyroidism 09/04/2009  . SEIZURE DISORDER 09/04/2009  . Fibromyalgia 05/05/2015  . 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 Thyroid activity decreased 05/05/2015  . Chronic neck pain 05/05/2015  . Dyspareunia in female 07/07/2015  . 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 Plantar wart of left foot 02/29/2016  . Menopausal symptoms 07/21/2016  . Mood disorder in conditions classified elsewhere 07/21/2016  . Left leg pain 03/18/2017  . Right foot pain 03/18/2017  . History of colon resection 07/09/2017  . History of irregular heartbeat 07/09/2017  . Sinus bradycardia by electrocardiogram 07/09/2017  . Hyperlipidemia 07/11/2017  . Elevated blood pressure reading 07/11/2017  . Memory changes 07/19/2017  . 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 Perennial allergic rhinitis 08/13/2017  . CFS (chronic fatigue syndrome) 11/27/2017  . Multiple food allergies 03/25/2018  . Environmental allergies 03/25/2018  . Callus of foot 07/29/2018  . Tonsillith  07/30/2018  . DDD (degenerative disc disease), cervical 08/21/2018  . Cervical radiculopathy 08/21/2018  . Chronic constipation 10/30/2018  . Food allergy 01/29/2019  . 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 Seborrheic keratoses 05/14/2019  . Suspected COVID-19 virus infection 09/11/2019   Resolved Ambulatory Problems    Diagnosis Date Noted  . URI 09/04/2009  . Acute bronchitis 05/03/2016  . Essential hypertension 07/19/2017   Past Medical History:  Diagnosis Date  . Allergy   . HSV-2 (herpes simplex virus 2) infection   . Seizures (HCC)   . Thyroid disease    Reviewed med, allergies, problem list.     Observations/Objective: No acute distress.  Normal mood and appearance.  Productive cough.  No labored breathing.   .. Today's Vitals   09/22/19 0917  BP: 137/89  Pulse: 66  Temp: (!) 97 F (36.1 C)  TempSrc: Oral  Weight: 119 lb (54 kg)  Height: 5' (1.524 m)   Body mass index is 23.24 kg/m.    Assessment and Plan: Marland KitchenMarland KitchenJacqueline was seen today for nasal congestion.  Diagnoses and all orders for this visit:  Sinobronchitis -     azithromycin (ZITHROMAX) 250 MG tablet; Take 2 tablets now and then one tablet for 4 days. -     predniSONE (DELTASONE) 20 MG tablet; Take 1 tablet (20 mg total) by mouth 2 (two) times daily with a meal. For 5 days.  COVID-19 virus infection   Treated with zpak and prednisone. Continue with symptomatic care. She is out of her 10 day window since symptoms started. No fever. Continue symptomatic care and rest. Note to go back to volunteering on Wednesday. Emailed letter.  Follow Up Instructions:    I discussed the assessment and treatment plan with the patient. The patient was provided an opportunity to ask questions and all were answered. The patient agreed with the plan and demonstrated an understanding of the instructions.   The patient was advised to call back or seek an in-person evaluation if the symptoms worsen or if the condition  fails to improve as anticipated.  I provided 10 minutes of non-face-to-face time during this encounter.   Iran Planas, PA-C

## 2019-09-29 ENCOUNTER — Telehealth: Payer: Self-pay | Admitting: Neurology

## 2019-09-29 MED ORDER — IPRATROPIUM BROMIDE 0.03 % NA SOLN: 2.0000 | Freq: Two times a day (BID) | NASAL | 1 refills | Status: DC

## 2019-09-29 NOTE — Telephone Encounter (Signed)
Judy Russell called and states she did finish the Zpak and prednisone. She is still taking the Flonase. She reports having post nasal drip that is causing her to cough and a runny nose. She would like recommendations on what she can take for the runny nose and post nasal drip.

## 2019-09-29 NOTE — Telephone Encounter (Signed)
Patient left vm stating she finished prednisone and z-pak and still having congestion, wanting Abbi Mancini to call in something to help.   I called patient back to see if she is taking an OTC decongestant to help. LMOM for patient to call back.

## 2019-09-29 NOTE — Telephone Encounter (Signed)
Okay, called in Atrovent nasal spray.

## 2019-09-30 NOTE — Telephone Encounter (Signed)
LMOM letting patient know. 

## 2019-10-06 ENCOUNTER — Other Ambulatory Visit: Payer: Self-pay

## 2019-10-06 ENCOUNTER — Telehealth (INDEPENDENT_AMBULATORY_CARE_PROVIDER_SITE_OTHER): Payer: Medicare Other | Admitting: Physician Assistant

## 2019-10-06 ENCOUNTER — Ambulatory Visit (INDEPENDENT_AMBULATORY_CARE_PROVIDER_SITE_OTHER): Payer: Medicare Other

## 2019-10-06 VITALS — Ht 60.0 in | Wt 119.0 lb

## 2019-10-06 DIAGNOSIS — R0789 Other chest pain: Secondary | ICD-10-CM | POA: Diagnosis not present

## 2019-10-06 DIAGNOSIS — Z8616 Personal history of COVID-19: Secondary | ICD-10-CM | POA: Diagnosis not present

## 2019-10-06 DIAGNOSIS — R0602 Shortness of breath: Secondary | ICD-10-CM | POA: Diagnosis not present

## 2019-10-06 DIAGNOSIS — J00 Acute nasopharyngitis [common cold]: Secondary | ICD-10-CM

## 2019-10-06 MED ORDER — ALBUTEROL SULFATE HFA 108 (90 BASE) MCG/ACT IN AERS: 2.0000 | INHALATION_SPRAY | Freq: Four times a day (QID) | RESPIRATORY_TRACT | 0 refills | Status: DC | PRN

## 2019-10-06 NOTE — Progress Notes (Signed)
Congestion/ cough Pain when breathing, feels heavy in chest Was given nasal spray by Dr. Linford Arnold - felt dizzy/weak when she used it Not taking allergy meds (only flonase) Doesn't know if she is having allergy issues vs post covid symptoms Will get vitals and give to [facility] at appt

## 2019-10-06 NOTE — Progress Notes (Signed)
Patient ID: Judy Russell, female   DOB: April 16, 1956, 64 y.o.   MRN: [id number] .Marland KitchenVirtual Visit via Video Note  I connected with Judy Russell on 10/07/19 at  3:00 PM EDT by a video enabled telemedicine application and verified that I am speaking with the correct person using two identifiers.  Location: Patient: home Provider: clinic   I discussed the limitations of evaluation and management by telemedicine and the availability of in person appointments. The patient expressed understanding and agreed to proceed.  History of Present Illness: Pt is a 64 yo female with hx of covid on 3/11 who calls into the clinic with concerning symptoms. She was seen virtually 3/22 and sent prednisone and zpak. She continues to have cough and congestion. She is concerned because the upper part of her lungs feel "heavy". She has some sharp pains when breathing. She has been using flonase with some benefit but called in last week due to runny nose and Dr. Madilyn Fireman gave her atrovent. She did not tolerate well and made her feel worse. She does have allergies but not taking any medications. She did feel better on prednisone but symptoms started to come back once she finished.   .. Active Ambulatory Problems    Diagnosis Date Noted  . Hypothyroidism 09/04/2009  . SEIZURE DISORDER 09/04/2009  . Fibromyalgia 05/05/2015  . 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 Thyroid activity decreased 05/05/2015  . Chronic neck pain 05/05/2015  . Dyspareunia in female 07/07/2015  . 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 Plantar wart of left foot 02/29/2016  . Menopausal symptoms 07/21/2016  . Mood disorder in conditions classified elsewhere 07/21/2016  . Left leg pain 03/18/2017  . Right foot pain 03/18/2017  . History of colon resection 07/09/2017  . History of irregular heartbeat 07/09/2017  . Sinus bradycardia by electrocardiogram 07/09/2017  . Hyperlipidemia 07/11/2017  . Elevated blood pressure  reading 07/11/2017  . Memory changes 07/19/2017  . 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 Perennial allergic rhinitis 08/13/2017  . CFS (chronic fatigue syndrome) 11/27/2017  . Multiple food allergies 03/25/2018  . Environmental allergies 03/25/2018  . Callus of foot 07/29/2018  . Tonsillith 07/30/2018  . DDD (degenerative disc disease), cervical 08/21/2018  . Cervical radiculopathy 08/21/2018  . Chronic constipation 10/30/2018  . Food allergy 01/29/2019  . 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 Seborrheic keratoses 05/14/2019  . Suspected COVID-19 virus infection 09/11/2019  . COVID-19 virus infection 09/22/2019  . History of COVID-19 10/07/2019   Resolved Ambulatory Problems    Diagnosis Date Noted  . URI 09/04/2009  . Acute bronchitis 05/03/2016  . Essential hypertension 07/19/2017   Past Medical History:  Diagnosis Date  . Allergy   . HSV-2 (herpes simplex virus 2) infection   . Seizures ([place])   . Thyroid disease    Reviewed med, allergy, problem list.     Observations/Objective: Normal appearance and mood today.  No coughing.  No acute distress.   .. Today's Vitals   10/06/19 1408  Weight: 119 lb (54 kg)  Height: 5' (1.524 m)   Body mass index is 23.24 kg/m.    Assessment and Plan: Marland KitchenMarland KitchenDejon was seen today for respiratory distress.  Diagnoses and all orders for this visit:  History of COVID-19 -     DG Chest 2 View  Chest tightness -     DG Chest 2 View -     albuterol (VENTOLIN HFA) 108 (90 Base) MCG/ACT inhaler; Inhale 2 puffs into the lungs every 6 (six)  hours as needed.  SOB (shortness of breath) -     DG Chest 2 View -     albuterol (VENTOLIN HFA) 108 (90 Base) MCG/ACT inhaler; Inhale 2 puffs into the lungs every 6 (six) hours as needed.  Acute rhinitis   Reassured patient sounds more allergic than post covid etc. Will get CXR of lungs for reassurance. Albuterol for SOB and chest tightness. I suspect allergies causing the rhinitis and perhaps even  the chest tightness. Discussed good deep breathing every hour. Start zyrtec at bedtime. Continue flonase daily. Follow up as needed.     Follow Up Instructions:    I discussed the assessment and treatment plan with the patient. The patient was provided an opportunity to ask questions and all were answered. The patient agreed with the plan and demonstrated an understanding of the instructions.   The patient was advised to call back or seek an in-person evaluation if the symptoms worsen or if the condition fails to improve as anticipated.  I provided 11 minutes of non-face-to-face time during this encounter.   Tandy Gaw, PA-C

## 2019-10-07 ENCOUNTER — Encounter: Payer: Self-pay | Admitting: Physician Assistant

## 2019-10-07 DIAGNOSIS — Z8616 Personal history of COVID-19: Secondary | ICD-10-CM | POA: Insufficient documentation

## 2019-10-07 NOTE — Progress Notes (Signed)
GREAT news. CXR clear. Looks great. I think this is more allergies and inflammation. Treatment plan stays the same. Albuterol as needed. Flonase and zyrtec daily.

## 2019-10-20 ENCOUNTER — Encounter: Payer: Self-pay | Admitting: Physician Assistant

## 2019-12-19 ENCOUNTER — Telehealth: Payer: Self-pay | Admitting: Physician Assistant

## 2019-12-19 NOTE — Telephone Encounter (Signed)
Patient said she lost or her handicap placard and wants to know what she needs to do to get a new one. Patient also wants to know if she can come in office to have a mole looked at/removed?

## 2019-12-19 NOTE — Telephone Encounter (Signed)
Left patient a voicemail with information below. Let patient know to call us back to schedule an appointment. 

## 2019-12-19 NOTE — Telephone Encounter (Signed)
Ok to double book next week for mole removal and we can get her handicap paperwork then. 40 minute slot.

## 2019-12-24 ENCOUNTER — Encounter: Payer: Self-pay | Admitting: Physician Assistant

## 2019-12-24 ENCOUNTER — Ambulatory Visit (INDEPENDENT_AMBULATORY_CARE_PROVIDER_SITE_OTHER): Payer: Medicare Other | Admitting: Physician Assistant

## 2019-12-24 ENCOUNTER — Other Ambulatory Visit: Payer: Self-pay

## 2019-12-24 VITALS — BP 128/80 | HR 48 | Ht 60.0 in | Wt 116.0 lb

## 2019-12-24 DIAGNOSIS — R0602 Shortness of breath: Secondary | ICD-10-CM | POA: Diagnosis not present

## 2019-12-24 DIAGNOSIS — L821 Other seborrheic keratosis: Secondary | ICD-10-CM | POA: Diagnosis not present

## 2019-12-24 DIAGNOSIS — R0789 Other chest pain: Secondary | ICD-10-CM | POA: Diagnosis not present

## 2019-12-24 DIAGNOSIS — Z77120 Contact with and (suspected) exposure to mold (toxic): Secondary | ICD-10-CM | POA: Insufficient documentation

## 2019-12-24 MED ORDER — ALBUTEROL SULFATE HFA 108 (90 BASE) MCG/ACT IN AERS: 2.0000 | INHALATION_SPRAY | Freq: Four times a day (QID) | RESPIRATORY_TRACT | 0 refills | Status: DC | PRN

## 2019-12-24 NOTE — Progress Notes (Signed)
Subjective:    Patient ID: Judy Russell, female    DOB: 09/25/1955, 64 y.o.   MRN: [id number]  HPI Pt is a 64 yo female who presents to the clinic to get a mole looked at.   She has had the mole for years and doesn't notice it getting bigger or changing colors. She just wanted it looked at. No bleeding.   She is concerned about mold in house and allergies.   She occasionally needs albuterol for her allergies and some mild reactive airway disease. Needs refills. She is not on anti-histamine.   She does take alka selzter as needed for headache and wonders if that is ok.   .. Active Ambulatory Problems    Diagnosis Date Noted  . Hypothyroidism 09/04/2009  . SEIZURE DISORDER 09/04/2009  . Fibromyalgia 05/05/2015  . 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 Thyroid activity decreased 05/05/2015  . Chronic neck pain 05/05/2015  . Dyspareunia in female 07/07/2015  . 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 Plantar wart of left foot 02/29/2016  . Menopausal symptoms 07/21/2016  . Mood disorder in conditions classified elsewhere 07/21/2016  . Left leg pain 03/18/2017  . Right foot pain 03/18/2017  . History of colon resection 07/09/2017  . History of irregular heartbeat 07/09/2017  . Sinus bradycardia by electrocardiogram 07/09/2017  . Hyperlipidemia 07/11/2017  . Elevated blood pressure reading 07/11/2017  . Memory changes 07/19/2017  . 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 Perennial allergic rhinitis 08/13/2017  . CFS (chronic fatigue syndrome) 11/27/2017  . Multiple food allergies 03/25/2018  . Environmental allergies 03/25/2018  . Callus of foot 07/29/2018  . Tonsillith 07/30/2018  . DDD (degenerative disc disease), cervical 08/21/2018  . Cervical radiculopathy 08/21/2018  . Chronic constipation 10/30/2018  . Food allergy 01/29/2019  . 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 Seborrheic keratoses 05/14/2019  . Suspected COVID-19 virus infection 09/11/2019  .  COVID-19 virus infection 09/22/2019  . History of COVID-19 10/07/2019  . Mold exposure 12/24/2019  . Chest tightness 12/24/2019   Resolved Ambulatory Problems    Diagnosis Date Noted  . URI 09/04/2009  . Acute bronchitis 05/03/2016  . Essential hypertension 07/19/2017   Past Medical History:  Diagnosis Date  . Allergy   . HSV-2 (herpes simplex virus 2) infection   . Seizures (HCC)   . Thyroid disease       Review of Systems  All other systems reviewed and are negative.      Objective:   Physical Exam Vitals reviewed.  Constitutional:      Appearance: Normal appearance.  Cardiovascular:     Rate and Rhythm: Normal rate and regular rhythm.     Pulses: Normal pulses.  Pulmonary:     Effort: Pulmonary effort is normal.  Skin:    Comments: Left shoulder 63mm by 82mm circular nevus without irregular borders with wart like non symmetrical appearance of color throughout.   Neurological:     General: No focal deficit present.     Mental Status: She is alert and oriented to person, place, and time.  Psychiatric:        Mood and Affect: Mood normal.           Assessment & Plan:  Marland KitchenMarland KitchenNidia was seen today for nevus.  Diagnoses and all orders for this visit:  Seborrheic keratoses  Chest tightness -     albuterol (VENTOLIN HFA) 108 (90 Base) MCG/ACT inhaler; Inhale 2 puffs into the lungs every 6 (six) hours as needed.  SOB (shortness of breath) -     albuterol (VENTOLIN HFA) 108 (90 Base) MCG/ACT inhaler; Inhale 2 puffs into the lungs every 6 (six) hours as needed.  Mold exposure   Cryotherapy Procedure Note  Pre-operative Diagnosis: seb keratosis  Post-operative Diagnosis: same  Locations: left mid shoulder  Indications: itching and bra irritation  Procedure Details  History of allergy to iodine: no. Pacemaker? no.  Patient informed of risks (permanent scarring, infection, light or dark discoloration, bleeding, infection, weakness, numbness and  recurrence of the lesion) and benefits of the procedure and verbal informed consent obtained.  The areas are treated with liquid nitrogen therapy, frozen until ice ball extended 2 mm beyond lesion, allowed to thaw, and treated again. The patient tolerated procedure well.  The patient was instructed on post-op care, warned that there may be blister formation, redness and pain. Recommend OTC analgesia as needed for pain.  Condition: Stable  Complications: none.  Plan: 1. Instructed to keep the area dry and covered for 24-48h and clean thereafter. 2. Warning signs of infection were reviewed.   3. Recommended that the patient use OTC acetaminophen as needed for pain.    Consider anti-histamine without corn for allergies. Albuterol as needed. Get 2nd opinion on mold.   [place] for [employer].

## 2019-12-24 NOTE — Patient Instructions (Signed)
Seborrheic Keratosis °A seborrheic keratosis is a common, noncancerous (benign) skin growth. These growths are velvety, waxy, rough, tan, brown, or black spots that appear on the skin. These skin growths can be flat or raised, and scaly. °What are the causes? °The cause of this condition is not known. °What increases the risk? °You are more likely to develop this condition if you: °· Have a family history of seborrheic keratosis. °· Are 50 or older. °· Are pregnant. °· Have had estrogen replacement therapy. °What are the signs or symptoms? °Symptoms of this condition include growths on the face, chest, shoulders, back, or other areas. These growths: °· Are usually painless, but may become irritated and itchy. °· Can be yellow, brown, black, or other colors. °· Are slightly raised or have a flat surface. °· Are sometimes rough or wart-like in texture. °· Are often velvety or waxy on the surface. °· Are round or oval-shaped. °· Often occur in groups, but may occur as a single growth. °How is this diagnosed? °This condition is diagnosed with a medical history and physical exam. °· A sample of the growth may be tested (skin biopsy). °· You may need to see a skin specialist (dermatologist). °How is this treated? °Treatment is not usually needed for this condition, unless the growths are irritated or bleed often. °· You may also choose to have the growths removed if you do not like their appearance. °? Most commonly, these growths are treated with a procedure in which liquid nitrogen is applied to "freeze" off the growth (cryosurgery). °? They may also be burned off with electricity (electrocautery) or removed by scraping (curettage). °Follow these instructions at home: °· Watch your growth for any changes. °· Keep all follow-up visits as told by your health care provider. This is important. °· Do not scratch or pick at the growth or growths. This can cause them to become irritated or infected. °Contact a health care  provider if: °· You suddenly have many new growths. °· Your growth bleeds, itches, or hurts. °· Your growth suddenly becomes larger or changes color. °Summary °· A seborrheic keratosis is a common, noncancerous (benign) skin growth. °· Treatment is not usually needed for this condition, unless the growths are irritated or bleed often. °· Watch your growth for any changes. °· Contact a health care provider if you suddenly have many new growths or your growth suddenly becomes larger or changes color. °· Keep all follow-up visits as told by your health care provider. This is important. °This information is not intended to replace advice given to you by your health care provider. Make sure you discuss any questions you have with your health care provider. °Document Revised: 11/01/2017 Document Reviewed: 11/01/2017 °Elsevier Patient Education © 2020 [employer] Inc[employer] ° °

## 2020-01-09 ENCOUNTER — Ambulatory Visit (INDEPENDENT_AMBULATORY_CARE_PROVIDER_SITE_OTHER): Payer: Medicare Other | Admitting: Physician Assistant

## 2020-01-09 ENCOUNTER — Encounter: Payer: Self-pay | Admitting: Physician Assistant

## 2020-01-09 VITALS — BP 119/68 | HR 55 | Ht 60.0 in | Wt 120.0 lb

## 2020-01-09 DIAGNOSIS — L299 Pruritus, unspecified: Secondary | ICD-10-CM

## 2020-01-09 DIAGNOSIS — T7840XD Allergy, unspecified, subsequent encounter: Secondary | ICD-10-CM

## 2020-01-09 DIAGNOSIS — T7840XA Allergy, unspecified, initial encounter: Secondary | ICD-10-CM | POA: Insufficient documentation

## 2020-01-09 NOTE — Progress Notes (Signed)
Subjective:    Patient ID: Judy Russell, female    DOB: 12-01-1955, 64 y.o.   MRN: [id number]  HPI  Patient is a 64 year old female who presents to the clinic with 1 month of scalp irritation and itching.  It was about 1 month ago that she switch from her shampoo that she is used for years because her husband says she needed to use a products without so many bad chemicals.  She switch from swab and she immediately started having more scalp itching and redness.  She assumed she was having some allergic reaction and started using tea tree oil and coconut oil.  She immediately felt like her scalp was burning and itching.  She has tried numerous over-the-counter things to help control itching and burning.  The only 2 things that made it worse was coconut and tea tree oil.  Seabreeze astringent and apple cider vinegar both helped for a little while but do not last.  She ended up seeing dermatology and they gave her ketoconazole shampoo it also helps for a little bit but then comes back.  They wanted to give her prednisone but she absolutely refuses this.  She is not taking any antihistamines such as Zyrtec, Claritin, Benadryl because of the fillers of cellulitis and corn that she is sensitive to.   .. Active Ambulatory Problems    Diagnosis Date Noted  . Hypothyroidism 09/04/2009  . SEIZURE DISORDER 09/04/2009  . Fibromyalgia 05/05/2015  . 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 Thyroid activity decreased 05/05/2015  . Chronic neck pain 05/05/2015  . Dyspareunia in female 07/07/2015  . 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 Plantar wart of left foot 02/29/2016  . Menopausal symptoms 07/21/2016  . Mood disorder in conditions classified elsewhere 07/21/2016  . Left leg pain 03/18/2017  . Right foot pain 03/18/2017  . History of colon resection 07/09/2017  . History of irregular heartbeat 07/09/2017  . Sinus bradycardia by electrocardiogram 07/09/2017  . Hyperlipidemia  07/11/2017  . Elevated blood pressure reading 07/11/2017  . Memory changes 07/19/2017  . 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 Perennial allergic rhinitis 08/13/2017  . CFS (chronic fatigue syndrome) 11/27/2017  . Multiple food allergies 03/25/2018  . Environmental allergies 03/25/2018  . Callus of foot 07/29/2018  . Tonsillith 07/30/2018  . DDD (degenerative disc disease), cervical 08/21/2018  . Cervical radiculopathy 08/21/2018  . Chronic constipation 10/30/2018  . Food allergy 01/29/2019  . 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 Seborrheic keratoses 05/14/2019  . Suspected COVID-19 virus infection 09/11/2019  . COVID-19 virus infection 09/22/2019  . History of COVID-19 10/07/2019  . Mold exposure 12/24/2019  . Chest tightness 12/24/2019  . Allergic reaction 01/09/2020   Resolved Ambulatory Problems    Diagnosis Date Noted  . URI 09/04/2009  . Acute bronchitis 05/03/2016  . Essential hypertension 07/19/2017   Past Medical History:  Diagnosis Date  . Allergy   . HSV-2 (herpes simplex virus 2) infection   . Seizures (HCC)   . Thyroid disease      Review of Systems See HPI.     Objective:   Physical Exam Vitals reviewed.  Constitutional:      Appearance: Normal appearance.  HENT:     Head:     Comments: Erythematous scalp. No sores, lesion, blisters and no extensive scales.     Right Ear: Tympanic membrane normal.     Left Ear: Tympanic membrane normal.     Mouth/Throat:     Mouth: Mucous membranes are  moist.  Cardiovascular:     Rate and Rhythm: Normal rate.  Pulmonary:     Effort: Pulmonary effort is normal.  Neurological:     Mental Status: She is alert.           Assessment & Plan:  Marland KitchenMarland KitchenDashanti was seen today for pruritis.  Diagnoses and all orders for this visit:  Allergic reaction, subsequent encounter  Scalp itch   More likely allergic reaction that has not completely subsided however could be some seborrheic dermatitis.  Offered hydroxyzine for the  itching.  She declined because of the ingredients of cellulose.  Patient does have many sensitivities.  I suggested that she continue the apple cider vinegar as well as the ketoconazole shampoo and get back to the shampoo that she has normally tolerated.  Wash her hair with cool water to help with itch.  If worsens or change please let us know.  Spent 20 minutes with patient.

## 2020-01-09 NOTE — Progress Notes (Deleted)
5 week history scalp itching Has tried: Tea tree oil - burned Sea breeze astringent - helped for a little bit, then itchy again Coconut oil - burned Apple cider vinegar/baking soda - helps for 15 minutes  Saw dermatology - given Ketoconazole shampoo - helps for 15 minutes, wanted to give prednisone - she refused

## 2020-01-09 NOTE — Patient Instructions (Signed)
Co[place] tar shampoo.   Seborrheic Dermatitis, Adult Seborrheic dermatitis is a skin disease that causes red, scaly patches. It usually occurs on the scalp, and it is often called dandruff. The patches may appear on other parts of the body. Skin patches tend to appear where there are many oil glands in the skin. Areas of the body that are commonly affected include:  Scalp.  Skin folds of the body.  Ears.  Eyebrows.  Neck.  Face.  Armpits.  The bearded area of men's faces. The condition may come and go for no known reason, and it is often long-lasting (chronic). What are the causes? The cause of this condition is not known. What increases the risk? This condition is more likely to develop in people who:  Have certain conditions, such as: ? HIV (human immunodeficiency virus). ? AIDS (acquired immunodeficiency syndrome). ? Parkinson disease. ? Mood disorders, such as depression.  Are 96-16 years old. What are the signs or symptoms? Symptoms of this condition include:  Thick scales on the scalp.  Redness on the face or in the armpits.  Skin that is flaky. The flakes may be white or yellow.  Skin that seems oily or dry but is not helped with moisturizers.  Itching or burning in the affected areas. How is this diagnosed? This condition is diagnosed with a medical history and physical exam. A sample of your skin may be tested (skin biopsy). You may need to see a skin specialist (dermatologist). How is this treated? There is no cure for this condition, but treatment can help to manage the symptoms. You may get treatment to remove scales, lower the risk of skin infection, and reduce swelling or itching. Treatment may include:  Creams that reduce swelling and irritation (steroids).  Creams that reduce skin yeast.  Medicated shampoo, soaps, moisturizing creams, or ointments.  Medicated moisturizing creams or ointments. Follow these instructions at home:  Apply  over-the-counter and prescription medicines only as told by your health care provider.  Use any medicated shampoo, soaps, skin creams, or ointments only as told by your health care provider.  Keep all follow-up visits as told by your health care provider. This is important. Contact a health care provider if:  Your symptoms do not improve with treatment.  Your symptoms get worse.  You have new symptoms. This information is not intended to replace advice given to you by your health care provider. Make sure you discuss any questions you have with your health care provider. Document Revised: 06/01/2017 Document Reviewed: 10/07/2015 Elsevier Patient Education  2020 [employer].

## 2020-01-12 DIAGNOSIS — L299 Pruritus, unspecified: Secondary | ICD-10-CM | POA: Insufficient documentation

## 2020-02-18 ENCOUNTER — Ambulatory Visit (INDEPENDENT_AMBULATORY_CARE_PROVIDER_SITE_OTHER): Payer: Medicare Other | Admitting: Physician Assistant

## 2020-02-18 ENCOUNTER — Other Ambulatory Visit: Payer: Self-pay

## 2020-02-18 VITALS — BP 122/57 | HR 48 | Ht 60.0 in | Wt 113.0 lb

## 2020-02-18 DIAGNOSIS — Z8616 Personal history of COVID-19: Secondary | ICD-10-CM

## 2020-02-18 DIAGNOSIS — R634 Abnormal weight loss: Secondary | ICD-10-CM | POA: Diagnosis not present

## 2020-02-18 DIAGNOSIS — R432 Parageusia: Secondary | ICD-10-CM

## 2020-02-18 NOTE — Progress Notes (Signed)
Subjective:    Patient ID: Judy Russell, female    DOB: [birth year], 64 y.o.   MRN: [id number]  HPI  Pt is a 64 yo female with history of covid, hypothyroidism, and food allergies who presents to the clinic with weight loss concerns.   She has lost 7lbs in last month. She admits many diet changes to help her GI side effects from foods she is sensitive too. She also never regained full taste from covid infection. She is very active and walking daily. She has good energy. She is sleeping good. She denies any SI/HC. She is eating but very healthy. She denies any SOB or CP. She denies any melena or hematochezia. cologuard negative in 2019. No hot flashes or fevers.   Pt had past covid infection in march. She does not want vaccine and wants to know if she still has antibodies.   .. Active Ambulatory Problems    Diagnosis Date Noted  . Hypothyroidism 09/04/2009  . SEIZURE DISORDER 09/04/2009  . Fibromyalgia 05/05/2015  . 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 Thyroid activity decreased 05/05/2015  . Chronic neck pain 05/05/2015  . Dyspareunia in female 07/07/2015  . 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 Plantar wart of left foot 02/29/2016  . Menopausal symptoms 07/21/2016  . Mood disorder in conditions classified elsewhere 07/21/2016  . Left leg pain 03/18/2017  . Right foot pain 03/18/2017  . History of colon resection 07/09/2017  . History of irregular heartbeat 07/09/2017  . Sinus bradycardia by electrocardiogram 07/09/2017  . Hyperlipidemia 07/11/2017  . Elevated blood pressure reading 07/11/2017  . Memory changes 07/19/2017  . 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 Perennial allergic rhinitis 08/13/2017  . CFS (chronic fatigue syndrome) 11/27/2017  . Multiple food allergies 03/25/2018  . Environmental allergies 03/25/2018  . Callus of foot 07/29/2018  . Tonsillith 07/30/2018  . DDD (degenerative disc disease), cervical 08/21/2018  . Cervical  radiculopathy 08/21/2018  . Chronic constipation 10/30/2018  . Food allergy 01/29/2019  . 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 Seborrheic keratoses 05/14/2019  . Suspected COVID-19 virus infection 09/11/2019  . COVID-19 virus infection 09/22/2019  . History of COVID-19 10/07/2019  . Mold exposure 12/24/2019  . Chest tightness 12/24/2019  . Allergic reaction 01/09/2020  . Scalp itch 01/12/2020  . Unintended weight loss 02/18/2020  . Loss of taste 02/18/2020   Resolved Ambulatory Problems    Diagnosis Date Noted  . URI 09/04/2009  . Acute bronchitis 05/03/2016  . Essential hypertension 07/19/2017   Past Medical History:  Diagnosis Date  . Allergy   . HSV-2 (herpes simplex virus 2) infection   . Seizures (HCC)   . Thyroid disease       Review of Systems  All other systems reviewed and are negative.      Objective:   Physical Exam Vitals reviewed.  Constitutional:      Appearance: Normal appearance.  HENT:     Head: Normocephalic.  Neck:     Vascular: No carotid bruit.  Cardiovascular:     Rate and Rhythm: Normal rate and regular rhythm.     Pulses: Normal pulses.  Pulmonary:     Effort: Pulmonary effort is normal.     Breath sounds: Normal breath sounds.  Musculoskeletal:     Right lower leg: No edema.     Left lower leg: No edema.  Lymphadenopathy:     Cervical: No cervical adenopathy.  Skin:    Findings: No erythema or rash.  Neurological:  General: No focal deficit present.     Mental Status: She is alert and oriented to person, place, and time.  Psychiatric:        Mood and Affect: Mood normal.           Assessment & Plan:  Marland KitchenMarland KitchenJameika was seen today for weight loss.  Diagnoses and all orders for this visit:  Unintended weight loss -     CBC with Differential/Platelet -     Fe+TIBC+Fer -     TSH -     COMPLETE METABOLIC PANEL WITH GFR -     HIV antibody (with reflex)  Loss of taste -     SARS CoV2 Serology(COVID19)  AB(IgG,IgM),Immunoassay  History of COVID-19 -     SARS CoV2 Serology(COVID19) AB(IgG,IgM),Immunoassay   Weight loss could be coming from loss or taste and strict diet changes made to help with GI symptoms from her food allergies. She feels really good. No red flag b symptoms.  I would like to check TSH to make sure not going hyperthyroid. Will do weight loss panel. No pain to report. Recent CXR 10/2019. 2019 negative cologuard. Denies and bowel changes.  Discussed getting 2000 calories a day.  Follow up in 1 month for weight check.   Will check for antibodies. Pt declines covid vaccine.   Spent 30 minutes with patient.

## 2020-02-18 NOTE — Patient Instructions (Signed)
Get labs.  Make sure eating 2000 calories a day.

## 2020-02-19 LAB — CBC WITH DIFFERENTIAL/PLATELET
Absolute Monocytes: 326 cells/uL (ref 200–950)
Basophils Absolute: 31 cells/uL (ref 0–200)
Basophils Relative: 0.7 %
Eosinophils Absolute: 79 cells/uL (ref 15–500)
Eosinophils Relative: 1.8 %
HCT: 39.7 % (ref 35.0–45.0)
Hemoglobin: 13.5 g/dL (ref 11.7–15.5)
Lymphs Abs: 1676 cells/uL (ref 850–3900)
MCH: 30.3 pg (ref 27.0–33.0)
MCHC: 34 g/dL (ref 32.0–36.0)
MCV: 89 fL (ref 80.0–100.0)
MPV: 9.6 fL (ref 7.5–12.5)
Monocytes Relative: 7.4 %
Neutro Abs: 2288 cells/uL (ref 1500–7800)
Neutrophils Relative %: 52 %
Platelets: 243 10*3/uL (ref 140–400)
RBC: 4.46 10*6/uL (ref 3.80–5.10)
RDW: 12.4 % (ref 11.0–15.0)
Total Lymphocyte: 38.1 %
WBC: 4.4 10*3/uL (ref 3.8–10.8)

## 2020-02-19 LAB — IRON,TIBC AND FERRITIN PANEL
%SAT: 33 % (calc) (ref 16–45)
Ferritin: 79 ng/mL (ref 16–288)
Iron: 112 ug/dL (ref 45–160)
TIBC: 338 mcg/dL (calc) (ref 250–450)

## 2020-02-19 LAB — COMPLETE METABOLIC PANEL WITH GFR
AG Ratio: 2.2 (calc) (ref 1.0–2.5)
ALT: 25 U/L (ref 6–29)
AST: 35 U/L (ref 10–35)
Albumin: 4.2 g/dL (ref 3.6–5.1)
Alkaline phosphatase (APISO): 58 U/L (ref 37–153)
BUN/Creatinine Ratio: 16 (calc) (ref 6–22)
BUN: 16 mg/dL (ref 7–25)
CO2: 27 mmol/L (ref 20–32)
Calcium: 9.3 mg/dL (ref 8.6–10.4)
Chloride: 107 mmol/L (ref 98–110)
Creat: 1 mg/dL — ABNORMAL HIGH (ref 0.50–0.99)
GFR, Est African American: 70 mL/min/{1.73_m2} (ref >=60)
GFR, Est Non African American: 60 mL/min/{1.73_m2} (ref >=60)
Globulin: 1.9 g/dL (calc) (ref 1.9–3.7)
Glucose, Bld: 88 mg/dL (ref 65–139)
Potassium: 4.1 mmol/L (ref 3.5–5.3)
Sodium: 142 mmol/L (ref 135–146)
Total Bilirubin: 0.5 mg/dL (ref 0.2–1.2)
Total Protein: 6.1 g/dL (ref 6.1–8.1)

## 2020-02-19 LAB — TSH: TSH: 0.81 mIU/L (ref 0.40–4.50)

## 2020-02-19 LAB — SARS COV-2 SEROLOGY(COVID-19)AB(IGG,IGM),IMMUNOASSAY
SARS CoV-2 AB IgG: POSITIVE — AB
SARS CoV-2 IgM: NEGATIVE

## 2020-02-19 LAB — HIV ANTIBODY (ROUTINE TESTING W REFLEX): HIV 1&2 Ab, 4th Generation: NONREACTIVE

## 2020-02-19 NOTE — Progress Notes (Signed)
Vi,   Hemoglobin great.  WBC great.  Iron and iron stores great.  TSH normal range but on the lower side of normal we could cut back on the thyroid just a tad to get you more in the 1-3 range. Lower ranges can promote weight loss.  Kidney function stable.  Liver enzymes great.   Overall labs look wonderful. Let me know if you want to try the thyroid dose change.

## 2020-02-19 NOTE — Progress Notes (Signed)
You do still have antibodies to covid 19.

## 2020-02-20 ENCOUNTER — Encounter: Payer: Self-pay | Admitting: Physician Assistant

## 2020-02-24 MED ORDER — LEVOTHYROXINE SODIUM 100 MCG PO TABS: 100.0000 ug | ORAL_TABLET | Freq: Every day | ORAL | 1 refills | Status: DC

## 2020-02-24 NOTE — Telephone Encounter (Signed)
Ok for vaccine letter. No one is making her get vaccine but she is wanting it for personal reasons. She has documented allergy to corn sucrose derivative which is a filler in covid vaccine and would be exempt from vaccine. She also did test positive for antibodies which would give her some protection.

## 2020-03-17 ENCOUNTER — Ambulatory Visit (INDEPENDENT_AMBULATORY_CARE_PROVIDER_SITE_OTHER): Payer: Medicare Other | Admitting: Physician Assistant

## 2020-03-17 ENCOUNTER — Other Ambulatory Visit: Payer: Self-pay

## 2020-03-17 ENCOUNTER — Encounter: Payer: Self-pay | Admitting: Physician Assistant

## 2020-03-17 VITALS — BP 117/66 | HR 46 | Ht 60.0 in | Wt 114.0 lb

## 2020-03-17 DIAGNOSIS — R634 Abnormal weight loss: Secondary | ICD-10-CM

## 2020-03-17 DIAGNOSIS — E039 Hypothyroidism, unspecified: Secondary | ICD-10-CM | POA: Diagnosis not present

## 2020-03-17 DIAGNOSIS — R001 Bradycardia, unspecified: Secondary | ICD-10-CM | POA: Diagnosis not present

## 2020-03-17 NOTE — Progress Notes (Addendum)
Subjective:    Patient ID: Judy Russell, female    DOB: 1955-11-02, 64 y.o.   MRN: [id number]  HPI 64 year old female with a history of hypothyroid, and iatrogenic hyperthyroid presents for follow up to discuss recent thyroid med change and to follow up on unintentional weight loss.  Patient states that she feels good at current dose. Her weight has been stable this past month and she has gained 1lb since her last visit. Patient denies heat or cold intolerance, fatigue, palpitations. She states that she has been running a 5k several mornings a week and has been enjoying being active. She is taking all medications as prescribed. She denies any dizziness, CP, palpitations, headaches. She has good energy and sleeping well.   .. 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   Left leg pain 03/18/2017   Right foot pain 03/18/2017   History of colon resection 07/09/2017   History of irregular heartbeat 07/09/2017   Sinus bradycardia by electrocardiogram 07/09/2017   Hyperlipidemia 07/11/2017   Elevated blood pressure reading 07/11/2017   Memory changes 07/19/2017   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   Perennial allergic rhinitis 08/13/2017   CFS (chronic fatigue syndrome) 11/27/2017   Multiple food allergies 03/25/2018   Environmental allergies 03/25/2018   Callus of foot 07/29/2018   Tonsillith 07/30/2018   DDD (degenerative disc disease), cervical 08/21/2018   Cervical radiculopathy 08/21/2018   Chronic constipation 10/30/2018    Food allergy 01/29/2019   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   Seborrheic keratoses 05/14/2019   Suspected COVID-19 virus infection 09/11/2019   COVID-19 virus infection 09/22/2019   History of COVID-19 10/07/2019   Mold exposure 12/24/2019   Chest tightness 12/24/2019   Allergic reaction 01/09/2020   Scalp itch 01/12/2020   Unintended weight loss 02/18/2020   Loss of taste 02/18/2020   Resolved Ambulatory Problems    Diagnosis Date Noted   URI 09/04/2009   Acute bronchitis 05/03/2016   Essential hypertension 07/19/2017   Past Medical History:  Diagnosis Date   Allergy    HSV-2 (herpes simplex virus 2) infection    Seizures (HCC)    Thyroid disease      Review of Systems  Constitutional: Negative for chills, fatigue and fever.  Respiratory: Negative for cough and shortness of breath.   Cardiovascular: Negative for chest pain and palpitations.  Endocrine: Negative for cold intolerance and heat intolerance.       Objective:   Physical Exam Constitutional:      General: She is not in acute distress.    Appearance: Normal appearance. She is normal weight.  Neck:     Thyroid: No thyroid mass, thyromegaly or thyroid tenderness.     Trachea: Trachea normal.  Cardiovascular:     Rate and Rhythm: Regular rhythm. Bradycardia present.  Pulmonary:     Effort: Pulmonary effort is normal.     Breath sounds: Normal breath sounds.  Musculoskeletal:     Cervical back: Normal range of motion and neck supple.  Lymphadenopathy:     Cervical: No cervical adenopathy.  Neurological:     Mental Status:  She is alert.    .. Today's Vitals   03/17/20 1038  BP: 117/66  Pulse: (!) 46  SpO2: 98%  Weight: 51.7 kg  Height: 5' (1.524 m)   Body mass index is 22.26 kg/m.        Assessment & Plan:  Marland KitchenMarland KitchenAuria was seen today for follow-up.  Diagnoses and all orders for this visit:  Hypothyroidism, unspecified type -     TSH  Unintended weight loss  Sinus  bradycardia by electrocardiogram   Pt is asymptomatic and doing great. Recheck TSH in 3-4 more weeks. Continue on daily. Gained 1lb. BMI perfect. She is very active. HR low at 46 but hx of this and asymptomatic.    Marland KitchenHarlon Flor PA-C, have reviewed and agree with the above documentation in it's entirety.

## 2020-03-31 ENCOUNTER — Other Ambulatory Visit: Payer: Self-pay

## 2020-03-31 ENCOUNTER — Telehealth: Payer: Self-pay | Admitting: Neurology

## 2020-03-31 ENCOUNTER — Ambulatory Visit (INDEPENDENT_AMBULATORY_CARE_PROVIDER_SITE_OTHER): Payer: Medicare Other | Admitting: Physician Assistant

## 2020-03-31 ENCOUNTER — Encounter: Payer: Self-pay | Admitting: Physician Assistant

## 2020-03-31 VITALS — BP 126/63 | HR 49 | Ht 60.0 in | Wt 114.0 lb

## 2020-03-31 DIAGNOSIS — R634 Abnormal weight loss: Secondary | ICD-10-CM | POA: Diagnosis not present

## 2020-03-31 DIAGNOSIS — R195 Other fecal abnormalities: Secondary | ICD-10-CM | POA: Diagnosis not present

## 2020-03-31 DIAGNOSIS — B719 Cestode infection, unspecified: Secondary | ICD-10-CM | POA: Diagnosis not present

## 2020-03-31 MED ORDER — IVERMECTIN 3 MG PO TABS: 150.0000 ug/kg | ORAL_TABLET | Freq: Once | ORAL | 0 refills | Status: AC

## 2020-03-31 MED ORDER — PRAZIQUANTEL 600 MG PO TABS: ORAL_TABLET | ORAL | 0 refills | Status: DC

## 2020-03-31 NOTE — Telephone Encounter (Signed)
[facility] left a vm stating they can not get praziquantel (BILTRICIDE) 600 MG tablet, but suggested Ivermectin as alternative tape worm treatment. Please advise.

## 2020-03-31 NOTE — Progress Notes (Signed)
Subjective:    Patient ID: Judy Russell, female    DOB: May 11, 1956, 64 y.o.   MRN: [id number]  HPI  Patient is a 64 year old female who presents to the clinic with stool changes.  She has noticed warm-like activity in her stool over the past week.  She has been struggling with weight gain and even weight loss at times.  On her scales at home she has lost 6 pounds in the last week. She denies any swelling. She had recent labs.   She reports to be eating a lot of food in general.  She does eat mostly meat.  She admits to eating her states rare.  She denies any abdominal pain.  She has noticed her stools being a little more loose. Denies any melena or hematochezia, dysuria, nausea, or vomiting.   .. Active Ambulatory Problems    Diagnosis Date Noted  . Hypothyroidism 09/04/2009  . SEIZURE DISORDER 09/04/2009  . Fibromyalgia 05/05/2015  . 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 Thyroid activity decreased 05/05/2015  . Chronic neck pain 05/05/2015  . Dyspareunia in female 07/07/2015  . 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 Plantar wart of left foot 02/29/2016  . Menopausal symptoms 07/21/2016  . Mood disorder in conditions classified elsewhere 07/21/2016  . Left leg pain 03/18/2017  . Right foot pain 03/18/2017  . History of colon resection 07/09/2017  . History of irregular heartbeat 07/09/2017  . Sinus bradycardia by electrocardiogram 07/09/2017  . Hyperlipidemia 07/11/2017  . Elevated blood pressure reading 07/11/2017  . Memory changes 07/19/2017  . 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 Perennial allergic rhinitis 08/13/2017  . CFS (chronic fatigue syndrome) 11/27/2017  . Multiple food allergies 03/25/2018  . Environmental allergies 03/25/2018  . Callus of foot 07/29/2018  . Tonsillith 07/30/2018  . DDD (degenerative disc disease), cervical 08/21/2018  . Cervical radiculopathy 08/21/2018  . Chronic constipation 10/30/2018  . Food allergy 01/29/2019  .  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 Seborrheic keratoses 05/14/2019  . Suspected COVID-19 virus infection 09/11/2019  . COVID-19 virus infection 09/22/2019  . History of COVID-19 10/07/2019  . Mold exposure 12/24/2019  . Chest tightness 12/24/2019  . Allergic reaction 01/09/2020  . Scalp itch 01/12/2020  . Unintended weight loss 02/18/2020  . Loss of taste 02/18/2020  . Tapeworm 04/02/2020   Resolved Ambulatory Problems    Diagnosis Date Noted  . URI 09/04/2009  . Acute bronchitis 05/03/2016  . Essential hypertension 07/19/2017   Past Medical History:  Diagnosis Date  . Allergy   . HSV-2 (herpes simplex virus 2) infection   . Seizures (HCC)   . Thyroid disease        Review of Systems See HPI.     Objective:   Physical Exam Vitals reviewed.  Constitutional:      Appearance: Normal appearance.  Cardiovascular:     Rate and Rhythm: Normal rate.  Pulmonary:     Effort: Pulmonary effort is normal.     Breath sounds: Normal breath sounds.  Abdominal:     General: There is no distension.     Palpations: Abdomen is soft.     Tenderness: There is no abdominal tenderness. There is no right CVA tenderness, left CVA tenderness, guarding or rebound.  Neurological:     General: No focal deficit present.     Mental Status: She is alert and oriented to person, place, and time.  Psychiatric:        Mood and Affect: Mood normal.  Pictures of what could be white larva in stool.       Assessment & Plan:  Marland KitchenMarland KitchenZella was seen today for weight loss.  Diagnoses and all orders for this visit:  Tapeworm -     praziquantel (BILTRICIDE) 600 MG tablet; Take 2 tablets once for tapeworm treatment.  Change in stool  Unintended weight loss   Patient has been worked up for weight loss with no significant findings.  Her thyroid level is in normal range and was recently adjusted.  Patient does exercise daily fairly aggressively for her age.  I am not overly concerned with her weight.   She does bring in pictures of stool that does appear to have some white larva in them.  She does eat raw meat.  We will go ahead and treat for tapeworm.

## 2020-03-31 NOTE — Telephone Encounter (Signed)
Sent ivermectin.

## 2020-03-31 NOTE — Patient Instructions (Signed)
Tapeworm Infection Tapeworms are parasites that can live in your intestines. When eggs from a tapeworm are consumed, the eggs develop into a young tapeworm (larva) and eventually an adult tapeworm inside of the body. An adult tapeworm can grow very long and can live inside of a human for many years. Tapeworm infection is rare in the [place]. There are several types of tapeworms. Most tapeworm infections cause only mild symptoms and are limited to the intestines. One type of tapeworm, called a pork tapeworm (Taenia solium or T. solium), can cause a more serious infection (cysticercosis). In this type of infection, tapeworm larvae can spread through the body and form cysts in areas such as the eyes, skin, muscle tissue, heart, and brain. What are the causes? This condition is caused by:  Eating beef, pork, or fish that is raw or has not been cooked well enough.  Drinking water that is contaminated with tapeworm eggs.  Eating food that is contaminated with tapeworm eggs. What increases the risk? You are more likely to develop this condition if:  You live in or travel to places where livestock roam freely, such as rural, developing countries.  You work with animals or are exposed to animals.  You live in a household with someone who has a tapeworm infection.  You eat food that has been prepared by someone with a tapeworm infection. What are the signs or symptoms? In some cases, there are no symptoms. If you have symptoms, they may include:  Pain in the abdomen.  Nausea.  Loss of appetite.  Weight loss.  Worm segments in your stool (feces).  Diarrhea. Symptoms of cysticercosis vary depending on where the cysts form in your body. Symptoms may include:  Feeling lumps under your skin.  Headache.  Seizure.  Confusion.  Loss of vision.  Muscle weakness.  Balance problems.  Eye pain. How is this diagnosed? This condition may be diagnosed based on:  Your  symptoms.  Your travel history.  Your diet history.  Blood tests.  Stool tests.  Tests on the fluid that surrounds the brain and spinal cord (cerebrospinal fluid, CSF). The fluid is removed with a needle during a spinal tap (lumbar puncture).  Imaging tests, such as a CT scan or MRI. How is this treated? Treatment for this condition depends on the type of tapeworm and your symptoms. Most often, this condition is treated with antiparasitic medicine to kill the tapeworms. Treatment for cysticercosis depends on the number of cysts and the symptoms you have. Treatment may include:  Medicines to: ? Kill tapeworm larvae or eggs. ? Decrease swelling (steroids). ? Prevent seizures (anti-epileptics).  Surgery to remove cysts. Follow these instructions at home:   Take over-the-counter and prescription medicines only as told by your health care provider.  If you were prescribed an antiparasitic medicine, take it as told by your health care provider. Do not stop taking the antiparasitic even if you start to feel better.  Keep all follow-up visits as told by your health care provider. This is important.  Wash your hands often with soap and water. If soap and water are not available, use hand sanitizer. How is this prevented?  Do not eat raw or undercooked fish or meat. Cook fish and meat according to food safety guidelines. Use a meat thermometer to make sure that fish and meat are cooked to the recommended temperatures.  Wash your hands with soap and water: ? After you use the toilet. ? Before you handle or prepare   food. ? After you handle or prepare food.  Freeze meat for 12 hours before cooking it to help prevent tapeworm infection. Only eat raw fish (such as sushi) that has been previously frozen.  Before you eat raw fruits and vegetables: ? Wash them in boiled, bottled, or treated water. ? Peel them.  When traveling in developing countries: ? Make sure you only drink water  that is bottled or treated. ? Do not eat or drink anything that may be contaminated, including beverages with ice cubes that may have been made from unboiled or untreated water. ? Do not eat raw foods that have been washed in unboiled tap water. ? It is safe to drink bottled or canned beverages, such as carbonated beverages, teas, pasteurized fruit drinks, or steaming hot beverages. Contact a health care provider if you:  Still have symptoms of tapeworm infection after your treatment is complete.  Develop any new symptoms. Get help right away if you:  Have a seizure.  Have sudden vision loss.  Feel light-headed or you faint.  Become confused. Summary  Tapeworms are parasites that can live in your intestines. Tapeworms can live in a human body for many years.  Tapeworms develop from tapeworm eggs that are consumed. This can occur from eating beef, pork, or fish that is raw or has not been cooked well enough, or from eating food or drinking water that is contaminated with tapeworm eggs.  Most tapeworm infections cause only mild symptoms and are limited to the intestines. One type of tapeworm can cause a more serious infection (cysticercosis) where the larvae spread through the body and form cysts.  Most often, this condition is treated with antiparasitic medicine to kill the tapeworms. This information is not intended to replace advice given to you by your health care provider. Make sure you discuss any questions you have with your health care provider. Document Revised: 10/10/2018 Document Reviewed: 07/27/2017 Elsevier Patient Education  2020 [employer].

## 2020-04-02 ENCOUNTER — Encounter: Payer: Self-pay | Admitting: Physician Assistant

## 2020-04-02 DIAGNOSIS — B719 Cestode infection, unspecified: Secondary | ICD-10-CM | POA: Insufficient documentation

## 2020-04-05 ENCOUNTER — Encounter: Payer: Self-pay | Admitting: Physician Assistant

## 2020-04-12 ENCOUNTER — Encounter: Payer: Self-pay | Admitting: Physician Assistant

## 2020-04-20 ENCOUNTER — Ambulatory Visit (INDEPENDENT_AMBULATORY_CARE_PROVIDER_SITE_OTHER): Payer: Medicare Other | Admitting: Physician Assistant

## 2020-04-20 ENCOUNTER — Encounter: Payer: Self-pay | Admitting: Physician Assistant

## 2020-04-20 VITALS — BP 147/44 | HR 48 | Ht 60.0 in | Wt 116.0 lb

## 2020-04-20 DIAGNOSIS — B89 Unspecified parasitic disease: Secondary | ICD-10-CM | POA: Diagnosis not present

## 2020-04-20 MED ORDER — ALBENDAZOLE 200 MG PO TABS: 400.0000 mg | ORAL_TABLET | Freq: Once | ORAL | 0 refills | Status: AC

## 2020-04-20 NOTE — Progress Notes (Signed)
Subjective:    Patient ID: Judy Russell, female    DOB: 02-05-1956, 64 y.o.   MRN: [id number]  HPI  Patient is a 64 year old female who presents to the clinic with more stool samples with parasitic concerns. She did take a single dose antiparasitic about 14 days ago. She had no complications with this. She denies any diarrhea, abdominal pain, fever, chills. She continues to have stools with "white worm looking organism" in them. She has been to her holistic chiropractor and they suspect parasites. She has had problems with unintentional weight loss recently.  Patient has a history of tonsilloliths. She cleaned out a tonsil up and felt like she saw a wormlike object in some of the tonsil exudate. She denies any sore throat.  .. Active Ambulatory Problems    Diagnosis Date Noted  . Hypothyroidism 09/04/2009  . SEIZURE DISORDER 09/04/2009  . Fibromyalgia 05/05/2015  . 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 Thyroid activity decreased 05/05/2015  . Chronic neck pain 05/05/2015  . Dyspareunia in female 07/07/2015  . 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 Plantar wart of left foot 02/29/2016  . Menopausal symptoms 07/21/2016  . Mood disorder in conditions classified elsewhere 07/21/2016  . Left leg pain 03/18/2017  . Right foot pain 03/18/2017  . History of colon resection 07/09/2017  . History of irregular heartbeat 07/09/2017  . Sinus bradycardia by electrocardiogram 07/09/2017  . Hyperlipidemia 07/11/2017  . Elevated blood pressure reading 07/11/2017  . Memory changes 07/19/2017  . 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 Perennial allergic rhinitis 08/13/2017  . CFS (chronic fatigue syndrome) 11/27/2017  . Multiple food allergies 03/25/2018  . Environmental allergies 03/25/2018  . Callus of foot 07/29/2018  . Tonsillith 07/30/2018  . DDD (degenerative disc disease), cervical 08/21/2018  . Cervical radiculopathy 08/21/2018  . Chronic constipation 10/30/2018   . Food allergy 01/29/2019  . 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 Seborrheic keratoses 05/14/2019  . Suspected COVID-19 virus infection 09/11/2019  . COVID-19 virus infection 09/22/2019  . History of COVID-19 10/07/2019  . Mold exposure 12/24/2019  . Chest tightness 12/24/2019  . Allergic reaction 01/09/2020  . Scalp itch 01/12/2020  . Unintended weight loss 02/18/2020  . Loss of taste 02/18/2020  . Tapeworm 04/02/2020   Resolved Ambulatory Problems    Diagnosis Date Noted  . URI 09/04/2009  . Acute bronchitis 05/03/2016  . Essential hypertension 07/19/2017   Past Medical History:  Diagnosis Date  . Allergy   . HSV-2 (herpes simplex virus 2) infection   . Seizures (HCC)   . Thyroid disease      Review of Systems See HPI.     Objective:   Physical Exam Vitals reviewed.  Constitutional:      Appearance: Normal appearance.  HENT:     Head: Normocephalic.  Cardiovascular:     Rate and Rhythm: Normal rate.     Pulses: Normal pulses.     Heart sounds: Normal heart sounds.  Pulmonary:     Effort: Pulmonary effort is normal.  Abdominal:     General: Bowel sounds are normal. There is no distension.     Palpations: Abdomen is soft.     Tenderness: There is no abdominal tenderness. There is no right CVA tenderness, left CVA tenderness, guarding or rebound.  Neurological:     General: No focal deficit present.     Mental Status: She is alert and oriented to person, place, and time.  Psychiatric:        Mood  and Affect: Mood normal.           Assessment & Plan:  Marland KitchenMarland KitchenCarter was seen today for follow-up.  Diagnoses and all orders for this visit:  Parasite infection -     Ova and parasite examination -     albendazole (ALBENZA) 200 MG tablet; Take 2 tablets (400 mg total) by mouth once for 1 dose.   Patient does bring in stool sample. I did put in collection tube and sent for parasitic testing. It does admittedly look like parasite in stool. Patient does eat a lot of  undercooked meat due to preference. We will treat with another round of antiparasitic. We will wait for ova and parasite results.

## 2020-04-20 NOTE — Patient Instructions (Signed)
Ova and Parasite Stool Test Why am I having this test? The ova and parasite stool test is an exam of your stool (feces) to check for signs of a parasite infection. This test may also be called a stool for ova and parasites test, or stool for O&P test. You may have this test if:  You have symptoms that may be caused by a parasite infection, such as: ? Diarrhea, especially if there is blood or mucus in your stool. ? Abdominal cramping or pain. ? Excessive gas (flatus). ? Nausea.  You have recently done something that raises the risk of parasitic infection, such as: ? Traveling internationally. ? Taking antibiotics for a long time. ? Drinking water from a well, creek, or river. The most common parasites that may cause an infection include:  Giardia intestinalis.  Hookworm (Ascaris species).  Tapeworm (Strongyloides species).  Cryptosporidium. What is being tested? This test checks your stool for the presence of a parasite or eggs (ova) from a parasite. What kind of sample is taken?  A stool sample is required for this test. How do I collect samples at home? When collecting a stool sample at home, make sure you:  Use supplies and instructions that you received from the lab.  Have a bowel movement directly into a clean, dry container. Do not collect stool from the water in the toilet.  Transfer the sample into the germ-free (sterile) cup that you received from the lab.  Do not let any toilet paper or urine get into the cup.  Wash your hands with soap and water after collecting the sample.  Return the sample to the lab as instructed. Tell a health care provider about:  Any allergies you have.  All medicines you are taking, including vitamins, herbs, eye drops, creams, and over-the-counter medicines.  Any surgeries you have had.  Any medical conditions you have.  Whether you are pregnant or may be pregnant. How are the results reported? Your test results will be  reported as either positive or negative for ova or parasites in your stool. What do the results mean? A negative result means that you have no ova or parasites in your stool, which is normal. A positive result means that you have ova, parasites, or both in your stool. This means that you have a parasite infection in your stomach or intestines (gastrointestinal tract, GI tract). The test results will specify which type of parasite is present. Talk with your health care provider about what your results mean. Questions to ask your health care provider Ask your health care provider, or the department that is doing the test:  When will my results be ready?  How will I get my results?  What are my treatment options?  What other tests do I need?  What are my next steps? Summary  The ova and parasite stool test is an exam of your stool (feces) to check for signs of a parasite infection.  A negative result means that you have no ova or parasites in your stool, which is normal.  A positive result means that you have ova, parasites, or both in your stool. This information is not intended to replace advice given to you by your health care provider. Make sure you discuss any questions you have with your health care provider. Document Revised: 10/10/2018 Document Reviewed: 02/27/2017 Elsevier Patient Education  2020 [employer].

## 2020-04-22 ENCOUNTER — Telehealth (INDEPENDENT_AMBULATORY_CARE_PROVIDER_SITE_OTHER): Payer: Medicare Other | Admitting: Family Medicine

## 2020-04-22 ENCOUNTER — Encounter: Payer: Self-pay | Admitting: Family Medicine

## 2020-04-22 DIAGNOSIS — Z20822 Contact with and (suspected) exposure to covid-19: Secondary | ICD-10-CM | POA: Diagnosis not present

## 2020-04-22 NOTE — Progress Notes (Signed)
Judy Russell - 64 y.o. female MRN [id number]  Date of birth: 05-22-1956   This visit type was conducted due to national recommendations for restrictions regarding the COVID-19 Pandemic (e.g. social distancing).  This format is felt to be most appropriate for this patient at this time.  All issues noted in this document were discussed and addressed.  No physical exam was performed (except for noted visual exam findings with Video Visits).  I discussed the limitations of evaluation and management by telemedicine and the availability of in person appointments. The patient expressed understanding and agreed to proceed.  I connected with@ on 04/22/20 at  1:20 PM EDT by a video enabled telemedicine application and verified that I am speaking with the correct person using two identifiers.  Present at visit: Judy Coombe, DO Daleen Bo   Patient Location: Home [address] [place] [address]   Provider location:   [facility]  Chief Complaint  Patient presents with  . Possible COVID    HPI  Judy Russell is a 64 y.o. female who presents via audio/video conferencing for a telehealth visit today.  She has complaint of COVID like symptoms.  Current symptoms include fatigue, dizziness, cough, congestion and headache.  Her symptoms started yesterday afternoon.  She denies wheezing, shortness of breath, GI symptoms.  She did have COVID in the spring of this year.    She is also being worked up for possible parasitic infection.  Prescribed albendazole but has not started yet.     ROS:  A comprehensive ROS was completed and negative except as noted per HPI  Past Medical History:  Diagnosis Date  . Allergy   . Fibromyalgia   . HSV-2 (herpes simplex virus 2) infection   . Seizures (HCC)   . Thyroid disease     Past Surgical History:  Procedure Laterality Date  . APPENDECTOMY    . DILATION AND CURETTAGE OF UTERUS    . OOPHORECTOMY Left     Family History  Problem Relation Age of Onset   . Cancer Mother        breast  . Diabetes Mother   . Alcohol abuse Father   . Hypertension Father   . Stroke Father     Social History   Socioeconomic History  . Marital status: Single    Spouse name: Not on file  . Number of children: Not on file  . Years of education: Not on file  . Highest education level: Not on file  Occupational History  . Not on file  Tobacco Use  . Smoking status: Never Smoker  . Smokeless tobacco: Never Used  Substance and Sexual Activity  . Alcohol use: No  . Drug use: No  . Sexual activity: Not Currently  Other Topics Concern  . Not on file  Social History Narrative  . Not on file   Social Determinants of Health   Financial Resource Strain:   . Difficulty of Paying Living Expenses: Not on file  Food Insecurity:   . Worried About Programme researcher, broadcasting/film/video in the Last Year: Not on file  . Ran Out of Food in the Last Year: Not on file  Transportation Needs:   . Lack of Transportation (Medical): Not on file  . Lack of Transportation (Non-Medical): Not on file  Physical Activity:   . Days of Exercise per Week: Not on file  . Minutes of Exercise per Session: Not on file  Stress:   . Feeling of Stress : Not on  file  Social Connections:   . Frequency of Communication with Friends and Family: Not on file  . Frequency of Social Gatherings with Friends and Family: Not on file  . Attends Religious Services: Not on file  . Active Member of Clubs or Organizations: Not on file  . Attends Banker Meetings: Not on file  . Marital Status: Not on file  Intimate Partner Violence:   . Fear of Current or Ex-Partner: Not on file  . Emotionally Abused: Not on file  . Physically Abused: Not on file  . Sexually Abused: Not on file     Current Outpatient Medications:  .  albuterol (VENTOLIN HFA) 108 (90 Base) MCG/ACT inhaler, Inhale 2 puffs into the lungs every 6 (six) hours as needed., Disp: 18 g, Rfl: 0 .  hydroxychloroquine (PLAQUENIL) 200 MG  tablet, , Disp: , Rfl:  .  ketoconazole (NIZORAL) 2 % shampoo, Apply 1 application topically 3 (three) times a week., Disp: , Rfl:  .  lamoTRIgine (LAMICTAL) 200 MG tablet, TAKE 1 TABLET BY MOUTH  DAILY, Disp: 90 tablet, Rfl: 3 .  levothyroxine (SYNTHROID) 100 MCG tablet, Take 1 tablet (100 mcg total) by mouth daily., Disp: 90 tablet, Rfl: 1 .  polyethylene glycol (MIRALAX / GLYCOLAX) 17 g packet, Take 17 g by mouth daily., Disp: , Rfl:  .  albendazole (ALBENZA) 200 MG tablet, Take 400 mg by mouth once., Disp: , Rfl:   EXAM:  VITALS per patient if applicable: There were no vitals taken for this visit.  GENERAL: alert, oriented, appears well and in no acute distress  HEENT: atraumatic, conjunttiva clear, no obvious abnormalities on inspection of external nose and ears  NECK: normal movements of the head and neck  LUNGS: on inspection no signs of respiratory distress, breathing rate appears normal, no obvious gross SOB, gasping or wheezing  CV: no obvious cyanosis  MS: moves all visible extremities without noticeable abnormality  PSYCH/NEURO: pleasant and cooperative, no obvious depression or anxiety, speech and thought processing grossly intact  ASSESSMENT AND PLAN:  Discussed the following assessment and plan:  Suspected COVID-19 virus infection Possibility of re-infection with COVID as it has been several months since her last infection.  She will come in to have testing completed.  Recommend isolation until results return.  Discussed with her possibility of other cold viruses that may cause similar symptoms if test returns negative.  Continue supportive care with increased fluids and rest.   Contact clinic for new or worsening syptoms.      I discussed the assessment and treatment plan with the patient. The patient was provided an opportunity to ask questions and all were answered. The patient agreed with the plan and demonstrated an understanding of the instructions.   The  patient was advised to call back or seek an in-person evaluation if the symptoms worsen or if the condition fails to improve as anticipated.    Judy Coombe, DO

## 2020-04-22 NOTE — Progress Notes (Signed)
Symptoms started 04/21/2020: Felt tired and slept most of the day   Today: dizzy, cough, headache, tired. No fever. Was in the office yesterday, 04/21/20 Had COVID previously, March 10th.  Should she take [facility] for parasites that [facility] prescribed.

## 2020-04-22 NOTE — Assessment & Plan Note (Addendum)
Possibility of re-infection with COVID as it has been several months since her last infection.  She will come in to have testing completed.  Recommend isolation until results return.  Discussed with her possibility of other cold viruses that may cause similar symptoms if test returns negative.  Continue supportive care with increased fluids and rest.   Contact clinic for new or worsening syptoms.

## 2020-04-23 ENCOUNTER — Encounter: Payer: Self-pay | Admitting: Physician Assistant

## 2020-04-23 ENCOUNTER — Encounter: Payer: Self-pay | Admitting: Family Medicine

## 2020-04-23 LAB — OVA AND PARASITE EXAMINATION
CONCENTRATE RESULT:: NONE SEEN
MICRO NUMBER:: 11090378
SPECIMEN QUALITY:: ADEQUATE
TRICHROME RESULT:: NONE SEEN

## 2020-04-23 NOTE — Progress Notes (Signed)
Vi, no parasite was seen in stool.

## 2020-04-24 LAB — NOVEL CORONAVIRUS, NAA: SARS-CoV-2, NAA: NOT DETECTED

## 2020-04-24 LAB — SARS-COV-2, NAA 2 DAY TAT

## 2020-05-26 ENCOUNTER — Other Ambulatory Visit: Payer: Self-pay | Admitting: Physician Assistant

## 2020-05-26 DIAGNOSIS — F0630 Mood disorder due to known physiological condition, unspecified: Secondary | ICD-10-CM

## 2020-06-03 ENCOUNTER — Telehealth: Payer: Self-pay | Admitting: Neurology

## 2020-06-03 NOTE — Telephone Encounter (Signed)
Patient called to get order for TSH, which is already ordered for patient to have done.   She also mentioned on VM she needs Judy Russell to order some back xrays that her chiropractor wants her to have. Will call patient to see why these were not ordered by their office.

## 2020-06-03 NOTE — Telephone Encounter (Signed)
Patient made aware TSH ordered.   Made her aware if chiropractor wants Xrays they will need to order or patient would need evaluated by Lesly Rubenstein to determine if she thinks xrays are needed. She expressed understanding.

## 2020-06-07 ENCOUNTER — Ambulatory Visit (INDEPENDENT_AMBULATORY_CARE_PROVIDER_SITE_OTHER): Payer: Medicare Other | Admitting: Physician Assistant

## 2020-06-07 ENCOUNTER — Encounter: Payer: Self-pay | Admitting: Physician Assistant

## 2020-06-07 ENCOUNTER — Ambulatory Visit (INDEPENDENT_AMBULATORY_CARE_PROVIDER_SITE_OTHER): Payer: Medicare Other

## 2020-06-07 ENCOUNTER — Other Ambulatory Visit: Payer: Self-pay

## 2020-06-07 VITALS — BP 153/75 | HR 54 | Ht 60.0 in | Wt 115.0 lb

## 2020-06-07 DIAGNOSIS — M5030 Other cervical disc degeneration, unspecified cervical region: Secondary | ICD-10-CM | POA: Diagnosis not present

## 2020-06-07 DIAGNOSIS — M549 Dorsalgia, unspecified: Secondary | ICD-10-CM | POA: Diagnosis not present

## 2020-06-07 DIAGNOSIS — M5136 Other intervertebral disc degeneration, lumbar region: Secondary | ICD-10-CM

## 2020-06-07 DIAGNOSIS — M4134 Thoracogenic scoliosis, thoracic region: Secondary | ICD-10-CM | POA: Diagnosis not present

## 2020-06-07 DIAGNOSIS — M47812 Spondylosis without myelopathy or radiculopathy, cervical region: Secondary | ICD-10-CM | POA: Diagnosis not present

## 2020-06-07 NOTE — Progress Notes (Signed)
Subjective:    Patient ID: Ninette Cotta, female    DOB: June 13, 1956, 64 y.o.   MRN: [id number]  HPI  Pt is a 64 yo female who presents to the clinic with intermittent neck, mid pack, lumbar pain. She does have some numbness and tingling pain that radiates into arms but none into legs. Her arms are usually at night.  She had imaging done 2018 and 2019 that some degenerative changes. No injury. She sees chiropractic care and wanted UTD xrays. She takes OTC tumeric. She is running daily.   .. Active Ambulatory Problems    Diagnosis Date Noted  . Hypothyroidism 09/04/2009  . SEIZURE DISORDER 09/04/2009  . Fibromyalgia 05/05/2015  . 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 Thyroid activity decreased 05/05/2015  . Chronic neck pain 05/05/2015  . Dyspareunia in female 07/07/2015  . 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 Plantar wart of left foot 02/29/2016  . Menopausal symptoms 07/21/2016  . Mood disorder in conditions classified elsewhere 07/21/2016  . Left leg pain 03/18/2017  . Right foot pain 03/18/2017  . History of colon resection 07/09/2017  . History of irregular heartbeat 07/09/2017  . Sinus bradycardia by electrocardiogram 07/09/2017  . Hyperlipidemia 07/11/2017  . Elevated blood pressure reading 07/11/2017  . Memory changes 07/19/2017  . 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 Perennial allergic rhinitis 08/13/2017  . CFS (chronic fatigue syndrome) 11/27/2017  . Multiple food allergies 03/25/2018  . Environmental allergies 03/25/2018  . Callus of foot 07/29/2018  . Tonsillith 07/30/2018  . DDD (degenerative disc disease), cervical 08/21/2018  . Cervical radiculopathy 08/21/2018  . Chronic constipation 10/30/2018  . Food allergy 01/29/2019  . 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 Seborrheic keratoses 05/14/2019  . Suspected COVID-19 virus infection 09/11/2019  . COVID-19 virus infection 09/22/2019  . History of COVID-19 10/07/2019  . Mold exposure  12/24/2019  . Chest tightness 12/24/2019  . Allergic reaction 01/09/2020  . Scalp itch 01/12/2020  . Unintended weight loss 02/18/2020  . Loss of taste 02/18/2020  . Tapeworm 04/02/2020   Resolved Ambulatory Problems    Diagnosis Date Noted  . URI 09/04/2009  . Acute bronchitis 05/03/2016  . Essential hypertension 07/19/2017   Past Medical History:  Diagnosis Date  . Allergy   . HSV-2 (herpes simplex virus 2) infection   . Seizures (HCC)   . Thyroid disease     Review of Systems  All other systems reviewed and are negative.      Objective:   Physical Exam Vitals reviewed.  Constitutional:      Appearance: Normal appearance.  Cardiovascular:     Rate and Rhythm: Normal rate and regular rhythm.  Pulmonary:     Effort: Pulmonary effort is normal.     Breath sounds: Normal breath sounds.  Musculoskeletal:     Comments: NROM of head/neck/shoulders/waist.  Negative SLR. Upper and lower ext strength 5/5.  Some tenderness over cervical and lumbar spine to palpation.  Negative Spurlings.   Neurological:     General: No focal deficit present.     Mental Status: She is alert and oriented to person, place, and time.  Psychiatric:        Mood and Affect: Mood normal.           Assessment & Plan:  Marland KitchenMarland KitchenDiahn was seen today for back pain.  Diagnoses and all orders for this visit:  DDD (degenerative disc disease), cervical -     DG Cervical Spine Complete  DDD (degenerative disc disease),  lumbar -     DG Lumbar Spine Complete  Mid back pain -     DG Thoracic Spine W/Swimmers   Ordered xrays.  Continue chiropractic care.  Discussed stretches and consider PT.  No red flags.  Running can be very hard on the body.  Tumeric/NSAIds as needed.  Follow up as needed.

## 2020-06-08 ENCOUNTER — Encounter: Payer: Self-pay | Admitting: Physician Assistant

## 2020-06-08 LAB — TSH: TSH: 10.41 mIU/L — ABNORMAL HIGH (ref 0.40–4.50)

## 2020-06-08 MED ORDER — LEVOTHYROXINE SODIUM 112 MCG PO TABS: 112.0000 ug | ORAL_TABLET | Freq: Every day | ORAL | 1 refills | Status: DC

## 2020-06-08 NOTE — Progress Notes (Signed)
Vi,   Degenerative changes around L2 and L3 of the lumbar spine.

## 2020-06-08 NOTE — Progress Notes (Signed)
Vi,   Multilevel degenerative changes of the cervical spine with disc height loss that is severe at C4/5 and C6/7.

## 2020-06-08 NOTE — Progress Notes (Signed)
Vi,   Very mild degenerative changes of the mid back.

## 2020-06-08 NOTE — Progress Notes (Signed)
Vi,   Your TSH went way up. This shows you going into a HYPO thyroid state. This means we need to increase medication unless you have not been taking like prescribed. Any problems with medication?

## 2020-06-09 MED ORDER — LEVOTHYROXINE SODIUM 112 MCG PO TABS: 112.0000 ug | ORAL_TABLET | Freq: Every day | ORAL | 1 refills | Status: DC

## 2020-06-09 NOTE — Addendum Note (Signed)
Addended by: Chalmers Cater on: 06/09/2020 07:09 AM   Modules accepted: Orders

## 2020-07-08 ENCOUNTER — Encounter: Payer: Self-pay | Admitting: Physician Assistant

## 2020-07-08 DIAGNOSIS — E039 Hypothyroidism, unspecified: Secondary | ICD-10-CM

## 2020-07-08 DIAGNOSIS — R634 Abnormal weight loss: Secondary | ICD-10-CM

## 2020-07-09 NOTE — Telephone Encounter (Signed)
Referral pended, please review and sign if appropriate.

## 2020-07-27 ENCOUNTER — Telehealth: Payer: Self-pay | Admitting: Dietician

## 2020-08-07 ENCOUNTER — Encounter: Payer: Self-pay | Admitting: Physician Assistant

## 2020-08-09 ENCOUNTER — Encounter: Payer: Self-pay | Admitting: Physician Assistant

## 2020-08-09 ENCOUNTER — Telehealth (INDEPENDENT_AMBULATORY_CARE_PROVIDER_SITE_OTHER): Payer: Medicare Other | Admitting: Physician Assistant

## 2020-08-09 VITALS — HR 60 | Temp 98.9°F | Wt 110.0 lb

## 2020-08-09 DIAGNOSIS — Z20822 Contact with and (suspected) exposure to covid-19: Secondary | ICD-10-CM

## 2020-08-09 DIAGNOSIS — R059 Cough, unspecified: Secondary | ICD-10-CM

## 2020-08-09 NOTE — Progress Notes (Signed)
..Virtual Visit via Video Note  I connected with Judy Russell on 08/09/20 at 10:10 AM EST by a video enabled telemedicine application and verified that I am speaking with the correct person using two identifiers.  Location: Patient: home Provider: clinic  .Judy KitchenParticipating in visit:  Patient: Judy Russell  Patient husband: Gene Provider: Tandy Gaw PA-C   I discussed the limitations of evaluation and management by telemedicine and the availability of in person appointments. The patient expressed understanding and agreed to proceed.  History of Present Illness: Patient is a 65 year old female who calls into the clinic with her husband to discuss body aches, cough, head congestion, nasal burning.  Patient has had symptoms since Thursday.  She denies any significant shortness of breath or wheezing.  She is very weak and tired.  She has been taking vitamins to help with the illness.  She is not Covid vaccinated.  She has previously had Covid about a year and a half ago.  She has had a low-grade fever and sore throat.  Ibuprofen is helping some.  .. Active Ambulatory Problems    Diagnosis Date Noted  . Hypothyroidism 09/04/2009  . SEIZURE DISORDER 09/04/2009  . Fibromyalgia 05/05/2015  . 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 Thyroid activity decreased 05/05/2015  . Chronic neck pain 05/05/2015  . Dyspareunia in female 07/07/2015  . 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 Plantar wart of left foot 02/29/2016  . Menopausal symptoms 07/21/2016  . Mood disorder in conditions classified elsewhere 07/21/2016  . Left leg pain 03/18/2017  . Right foot pain 03/18/2017  . History of colon resection 07/09/2017  . History of irregular heartbeat 07/09/2017  . Sinus bradycardia by electrocardiogram 07/09/2017  . Hyperlipidemia 07/11/2017  . Elevated blood pressure reading 07/11/2017  . Memory changes 07/19/2017  . 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 Perennial allergic rhinitis  08/13/2017  . CFS (chronic fatigue syndrome) 11/27/2017  . Multiple food allergies 03/25/2018  . Environmental allergies 03/25/2018  . Callus of foot 07/29/2018  . Tonsillith 07/30/2018  . DDD (degenerative disc disease), cervical 08/21/2018  . Cervical radiculopathy 08/21/2018  . Chronic constipation 10/30/2018  . Food allergy 01/29/2019  . 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 Seborrheic keratoses 05/14/2019  . Suspected COVID-19 virus infection 09/11/2019  . COVID-19 virus infection 09/22/2019  . History of COVID-19 10/07/2019  . Mold exposure 12/24/2019  . Chest tightness 12/24/2019  . Allergic reaction 01/09/2020  . Scalp itch 01/12/2020  . Unintended weight loss 02/18/2020  . Loss of taste 02/18/2020  . Tapeworm 04/02/2020   Resolved Ambulatory Problems    Diagnosis Date Noted  . URI 09/04/2009  . Acute bronchitis 05/03/2016  . Essential hypertension 07/19/2017   Past Medical History:  Diagnosis Date  . Allergy   . HSV-2 (herpes simplex virus 2) infection   . Seizures (HCC)   . Thyroid disease    Reviewed med, allergy, problem list.     Observations/Objective: No acute distress Normal mood and appearance Congested sounding Dry cough  .Judy KitchenThere were no vitals filed for this visit. There is no height or weight on file to calculate BMI.       Assessment and Plan: Judy KitchenMarland KitchenAlmina was seen today for headache and cough.  Diagnoses and all orders for this visit:  Suspected COVID-19 virus infection -     Novel Coronavirus, NAA ([facility])  Cough -     Novel Coronavirus, NAA ([facility])   Pt is not vaccinated for covid. Discussed symptomatic care with vitamin D, C  and zinc. Ok for ibuprofen and mucinex. Will get swabbed today. Quarantine for 10 days since has been 5 days and still symptomatic. Discussed red flags signs and symptoms of covid infection.   Follow Up Instructions:    I discussed the assessment and treatment plan with the patient. The patient was provided an  opportunity to ask questions and all were answered. The patient agreed with the plan and demonstrated an understanding of the instructions.   The patient was advised to call back or seek an in-person evaluation if the symptoms worsen or if the condition fails to improve as anticipated.   Tandy Gaw, PA-C

## 2020-08-12 ENCOUNTER — Encounter: Payer: Self-pay | Admitting: Physician Assistant

## 2020-08-27 ENCOUNTER — Other Ambulatory Visit: Payer: Self-pay

## 2020-08-27 DIAGNOSIS — E039 Hypothyroidism, unspecified: Secondary | ICD-10-CM

## 2020-08-27 NOTE — Progress Notes (Signed)
Ordered labs

## 2020-08-28 LAB — TSH: TSH: 1.22 mIU/L (ref 0.40–4.50)

## 2020-08-30 ENCOUNTER — Encounter: Payer: Self-pay | Admitting: Physician Assistant

## 2020-08-30 MED ORDER — LEVOTHYROXINE SODIUM 112 MCG PO TABS: 112.0000 ug | ORAL_TABLET | Freq: Every day | ORAL | 1 refills | Status: DC

## 2020-08-30 NOTE — Progress Notes (Signed)
TSH is perfect. How do you feel?   JJ if feeling great. Ok to send refill and recheck in 3 months.

## 2020-08-30 NOTE — Addendum Note (Signed)
Addended by: Chalmers Cater on: 08/30/2020 10:03 AM   Modules accepted: Orders

## 2020-09-02 DIAGNOSIS — Z888 Allergy status to other drugs, medicaments and biological substances status: Secondary | ICD-10-CM | POA: Diagnosis not present

## 2020-09-02 DIAGNOSIS — R569 Unspecified convulsions: Secondary | ICD-10-CM | POA: Diagnosis not present

## 2020-09-02 DIAGNOSIS — N3081 Other cystitis with hematuria: Secondary | ICD-10-CM | POA: Diagnosis not present

## 2020-09-02 DIAGNOSIS — Z91018 Allergy to other foods: Secondary | ICD-10-CM | POA: Diagnosis not present

## 2020-09-02 DIAGNOSIS — N3000 Acute cystitis without hematuria: Secondary | ICD-10-CM | POA: Diagnosis not present

## 2020-09-02 DIAGNOSIS — E079 Disorder of thyroid, unspecified: Secondary | ICD-10-CM | POA: Diagnosis not present

## 2020-09-02 DIAGNOSIS — Z79899 Other long term (current) drug therapy: Secondary | ICD-10-CM | POA: Diagnosis not present

## 2020-09-02 DIAGNOSIS — Z85828 Personal history of other malignant neoplasm of skin: Secondary | ICD-10-CM | POA: Diagnosis not present

## 2020-09-02 DIAGNOSIS — Z7902 Long term (current) use of antithrombotics/antiplatelets: Secondary | ICD-10-CM | POA: Diagnosis not present

## 2020-09-02 DIAGNOSIS — Z9049 Acquired absence of other specified parts of digestive tract: Secondary | ICD-10-CM | POA: Diagnosis not present

## 2020-09-03 ENCOUNTER — Encounter: Payer: Self-pay | Admitting: Physician Assistant

## 2020-09-03 ENCOUNTER — Telehealth: Payer: Self-pay

## 2020-09-03 NOTE — Telephone Encounter (Signed)
Transition Care Management Unsuccessful Follow-up Telephone Call  Date of discharge and from where:  09/02/2020 from [facility]  Attempts:  1st Attempt  Reason for unsuccessful TCM follow-up call:  Left voice message

## 2020-09-06 NOTE — Telephone Encounter (Signed)
Transition Care Management Unsuccessful Follow-up Telephone Call  Date of discharge and from where:  09/02/2020 from [facility]  Attempts:  2nd Attempt  Reason for unsuccessful TCM follow-up call:  Left voice message

## 2020-09-07 NOTE — Telephone Encounter (Signed)
Transition Care Management Unsuccessful Follow-up Telephone Call  Date of discharge and from where:  09/02/2020 from [facility]  Attempts:  3rd Attempt  Reason for unsuccessful TCM follow-up call:  Unable to reach patient

## 2020-09-14 ENCOUNTER — Other Ambulatory Visit: Payer: Self-pay

## 2020-09-14 ENCOUNTER — Ambulatory Visit (INDEPENDENT_AMBULATORY_CARE_PROVIDER_SITE_OTHER): Payer: Medicare Other | Admitting: Physician Assistant

## 2020-09-14 ENCOUNTER — Encounter: Payer: Self-pay | Admitting: Physician Assistant

## 2020-09-14 VITALS — BP 145/77 | HR 54 | Ht 60.0 in | Wt 114.0 lb

## 2020-09-14 DIAGNOSIS — B351 Tinea unguium: Secondary | ICD-10-CM

## 2020-09-14 DIAGNOSIS — L84 Corns and callosities: Secondary | ICD-10-CM

## 2020-09-14 MED ORDER — CICLOPIROX 8 % EX SOLN: Freq: Every day | CUTANEOUS | 0 refills | Status: DC

## 2020-09-14 NOTE — Patient Instructions (Signed)
Corns and Calluses Corns are small areas of thickened skin that form on the top, sides, or tip of a toe. Corns have a cone-shaped core with a point that can press on a nerve below. This causes pain. Calluses are areas of thickened skin that can form anywhere on the body, including the hands, fingers, palms, soles of the feet, and heels. Calluses are usually larger than corns. What are the causes? Corns and calluses are caused by rubbing (friction) or pressure, such as from shoes that are too tight or do not fit properly. What increases the risk? Corns are more likely to develop in people who have misshapen toes (toe deformities), such as hammer toes. Calluses can form with friction to any area of the skin. They are more likely to develop in people who:  Work with their hands.  Wear shoes that fit poorly, are too tight, or are high-heeled.  Have toe deformities. What are the signs or symptoms? Symptoms of a corn or callus include:  A hard growth on the skin.  Pain or tenderness under the skin.  Redness and swelling.  Increased discomfort while wearing tight-fitting shoes, if your feet are affected. If a corn or callus becomes infected, symptoms may include:  Redness and swelling that gets worse.  Pain.  Fluid, blood, or pus draining from the corn or callus.   How is this diagnosed? Corns and calluses may be diagnosed based on your symptoms, your medical history, and a physical exam. How is this treated? Treatment for corns and calluses may include:  Removing the cause of the friction or pressure. This may involve: ? Changing your shoes. ? Wearing shoe inserts (orthotics) or other protective layers in your shoes, such as a corn pad. ? Wearing gloves.  Applying medicine to the skin (topical medicine) to help soften skin in the hardened, thickened areas.  Removing layers of dead skin with a file to reduce the size of the corn or callus.  Removing the corn or callus with a  scalpel or laser.  Taking antibiotic medicines, if your corn or callus is infected.  Having surgery, if a toe deformity is the cause. Follow these instructions at home:  Take over-the-counter and prescription medicines only as told by your health care provider.  If you were prescribed an antibiotic medicine, take it as told by your health care provider. Do not stop taking it even if your condition improves.  Wear shoes that fit well. Avoid wearing high-heeled shoes and shoes that are too tight or too loose.  Wear any padding, protective layers, gloves, or orthotics as told by your health care provider.  Soak your hands or feet. Then use a file or pumice stone to soften your corn or callus. Do this as told by your health care provider.  Check your corn or callus every day for signs of infection.   Contact a health care provider if:  Your symptoms do not improve with treatment.  You have redness or swelling that gets worse.  Your corn or callus becomes painful.  You have fluid, blood, or pus coming from your corn or callus.  You have new symptoms. Get help right away if:  You develop severe pain with redness. Summary  Corns are small areas of thickened skin that form on the top, sides, or tip of a toe. These can be painful.  Calluses are areas of thickened skin that can form anywhere on the body, including the hands, fingers, palms, and soles of the   feet. Calluses are usually larger than corns.  Corns and calluses are caused by rubbing (friction) or pressure, such as from shoes that are too tight or do not fit properly.  Treatment may include wearing padding, protective layers, gloves, or orthotics as told by your health care provider. This information is not intended to replace advice given to you by your health care provider. Make sure you discuss any questions you have with your health care provider. Document Revised: 10/16/2019 Document Reviewed: 10/16/2019 Elsevier  Patient Education  2021 [employer]  

## 2020-09-14 NOTE — Progress Notes (Signed)
   Subjective:    Patient ID: Judy Russell, female    DOB: 01/03/1956, 65 y.o.   MRN: [id number]  HPI  Pt has a area on her right distal 2nd toe medial side that has some discomfort to it. It rubs against her great toe. She noticed for a week or so. She is a runner. Not done anything to make better except wear a corn pad which helps. Not getting worse. Wonders what to do about it.   .. Active Ambulatory Problems    Diagnosis Date Noted  . Hypothyroidism 09/04/2009  . SEIZURE DISORDER 09/04/2009  . Fibromyalgia 05/05/2015  . 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 Thyroid activity decreased 05/05/2015  . Chronic neck pain 05/05/2015  . Dyspareunia in female 07/07/2015  . 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 Plantar wart of left foot 02/29/2016  . Menopausal symptoms 07/21/2016  . Mood disorder in conditions classified elsewhere 07/21/2016  . Left leg pain 03/18/2017  . Right foot pain 03/18/2017  . History of colon resection 07/09/2017  . History of irregular heartbeat 07/09/2017  . Sinus bradycardia by electrocardiogram 07/09/2017  . Hyperlipidemia 07/11/2017  . Elevated blood pressure reading 07/11/2017  . Memory changes 07/19/2017  . 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 Perennial allergic rhinitis 08/13/2017  . CFS (chronic fatigue syndrome) 11/27/2017  . Multiple food allergies 03/25/2018  . Environmental allergies 03/25/2018  . Callus of foot 07/29/2018  . Tonsillith 07/30/2018  . DDD (degenerative disc disease), cervical 08/21/2018  . Cervical radiculopathy 08/21/2018  . Chronic constipation 10/30/2018  . Food allergy 01/29/2019  . 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 Seborrheic keratoses 05/14/2019  . Suspected COVID-19 virus infection 09/11/2019  . COVID-19 virus infection 09/22/2019  . History of COVID-19 10/07/2019  . Mold exposure 12/24/2019  . Chest tightness 12/24/2019  . Allergic reaction 01/09/2020  . Scalp itch 01/12/2020  . Unintended  weight loss 02/18/2020  . Loss of taste 02/18/2020  . Tapeworm 04/02/2020  . Toenail fungus 09/14/2020  . Corn of toe 09/14/2020   Resolved Ambulatory Problems    Diagnosis Date Noted  . URI 09/04/2009  . Acute bronchitis 05/03/2016  . Essential hypertension 07/19/2017   Past Medical History:  Diagnosis Date  . Allergy   . HSV-2 (herpes simplex virus 2) infection   . Seizures (HCC)   . Thyroid disease        Review of Systems See HPI.     Objective:   Physical Exam Vitals reviewed.  HENT:     Head: Normocephalic.  Musculoskeletal:     Comments: Right 2nd toe medial side painful corn. No redness.   Skin:    Comments: Multiple thick dystrophic toenails on bilateral feet.   Neurological:     Mental Status: She is alert.           Assessment & Plan:  Marland KitchenMarland KitchenDiamante was seen today for toe pain.  Diagnoses and all orders for this visit:  Corn of toe  Toenail fungus -     ciclopirox (PENLAC) 8 % solution; Apply topically at bedtime. Apply over nail and surrounding skin. Apply daily over previous coat. After seven (7) days, may remove with alcohol and continue cycle.   Suggested corn pad and epson water soak and pummel stone for next few weeks.   Fungus of toenails. Use penlac. She wants to try vicks vapor rub first. Discussed prevention and treatment.

## 2020-09-17 ENCOUNTER — Other Ambulatory Visit: Payer: Self-pay

## 2020-09-17 ENCOUNTER — Ambulatory Visit (INDEPENDENT_AMBULATORY_CARE_PROVIDER_SITE_OTHER): Payer: Medicare Other | Admitting: Physician Assistant

## 2020-09-17 VITALS — BP 159/85 | HR 53 | Temp 97.7°F | Ht 60.0 in | Wt 114.0 lb

## 2020-09-17 DIAGNOSIS — N3000 Acute cystitis without hematuria: Secondary | ICD-10-CM

## 2020-09-17 DIAGNOSIS — Z87440 Personal history of urinary (tract) infections: Secondary | ICD-10-CM | POA: Diagnosis not present

## 2020-09-17 DIAGNOSIS — R300 Dysuria: Secondary | ICD-10-CM | POA: Diagnosis not present

## 2020-09-17 LAB — POCT URINALYSIS DIP (CLINITEK)
Bilirubin, UA: NEGATIVE
Blood, UA: NEGATIVE
Glucose, UA: NEGATIVE mg/dL
Ketones, POC UA: NEGATIVE mg/dL
Nitrite, UA: NEGATIVE
POC PROTEIN,UA: NEGATIVE
Spec Grav, UA: 1.025 (ref 1.010–1.025)
Urobilinogen, UA: 0.2 E.U./dL
pH, UA: 7.5 (ref 5.0–8.0)

## 2020-09-17 MED ORDER — SULFAMETHOXAZOLE-TRIMETHOPRIM 800-160 MG PO TABS
1.0000 | ORAL_TABLET | Freq: Two times a day (BID) | ORAL | 0 refills | Status: DC
Start: 2020-09-17 — End: 2020-11-30

## 2020-09-17 NOTE — Patient Instructions (Signed)
Urinary Tract Infection, Adult  A urinary tract infection (UTI) is an infection of any part of the urinary tract. The urinary tract includes the kidneys, ureters, bladder, and urethra. These organs make, store, and get rid of urine in the body. An upper UTI affects the ureters and kidneys. A lower UTI affects the bladder and urethra. What are the causes? Most urinary tract infections are caused by bacteria in your genital area around your urethra, where urine leaves your body. These bacteria grow and cause inflammation of your urinary tract. What increases the risk? You are more likely to develop this condition if:  You have a urinary catheter that stays in place.  You are not able to control when you urinate or have a bowel movement (incontinence).  You are female and you: ? Use a spermicide or diaphragm for birth control. ? Have low estrogen levels. ? Are pregnant.  You have certain genes that increase your risk.  You are sexually active.  You take antibiotic medicines.  You have a condition that causes your flow of urine to slow down, such as: ? An enlarged prostate, if you are female. ? Blockage in your urethra. ? A kidney stone. ? A nerve condition that affects your bladder control (neurogenic bladder). ? Not getting enough to drink, or not urinating often.  You have certain medical conditions, such as: ? Diabetes. ? A weak disease-fighting system (immunesystem). ? Sickle cell disease. ? Gout. ? Spinal cord injury. What are the signs or symptoms? Symptoms of this condition include:  Needing to urinate right away (urgency).  Frequent urination. This may include small amounts of urine each time you urinate.  Pain or burning with urination.  Blood in the urine.  Urine that smells bad or unusual.  Trouble urinating.  Cloudy urine.  Vaginal discharge, if you are female.  Pain in the abdomen or the lower back. You may also have:  Vomiting or a decreased  appetite.  Confusion.  Irritability or tiredness.  A fever or chills.  Diarrhea. The first symptom in older adults may be confusion. In some cases, they may not have any symptoms until the infection has worsened. How is this diagnosed? This condition is diagnosed based on your medical history and a physical exam. You may also have other tests, including:  Urine tests.  Blood tests.  Tests for STIs (sexually transmitted infections). If you have had more than one UTI, a cystoscopy or imaging studies may be done to determine the cause of the infections. How is this treated? Treatment for this condition includes:  Antibiotic medicine.  Over-the-counter medicines to treat discomfort.  Drinking enough water to stay hydrated. If you have frequent infections or have other conditions such as a kidney stone, you may need to see a health care provider who specializes in the urinary tract (urologist). In rare cases, urinary tract infections can cause sepsis. Sepsis is a life-threatening condition that occurs when the body responds to an infection. Sepsis is treated in the hospital with IV antibiotics, fluids, and other medicines. Follow these instructions at home: Medicines  Take over-the-counter and prescription medicines only as told by your health care provider.  If you were prescribed an antibiotic medicine, take it as told by your health care provider. Do not stop using the antibiotic even if you start to feel better. General instructions  Make sure you: ? Empty your bladder often and completely. Do not hold urine for long periods of time. ? Empty your bladder after   sex. ? Wipe from front to back after urinating or having a bowel movement if you are female. Use each tissue only one time when you wipe.  Drink enough fluid to keep your urine pale yellow.  Keep all follow-up visits. This is important.   Contact a health care provider if:  Your symptoms do not get better after 1-2  days.  Your symptoms go away and then return. Get help right away if:  You have severe pain in your back or your lower abdomen.  You have a fever or chills.  You have nausea or vomiting. Summary  A urinary tract infection (UTI) is an infection of any part of the urinary tract, which includes the kidneys, ureters, bladder, and urethra.  Most urinary tract infections are caused by bacteria in your genital area.  Treatment for this condition often includes antibiotic medicines.  If you were prescribed an antibiotic medicine, take it as told by your health care provider. Do not stop using the antibiotic even if you start to feel better.  Keep all follow-up visits. This is important. This information is not intended to replace advice given to you by your health care provider. Make sure you discuss any questions you have with your health care provider. Document Revised: 01/30/2020 Document Reviewed: 01/30/2020 Elsevier Patient Education  2021 [employer] Inc.  

## 2020-09-19 LAB — URINE CULTURE
MICRO NUMBER:: 11668010
SPECIMEN QUALITY:: ADEQUATE

## 2020-09-20 ENCOUNTER — Encounter: Payer: Self-pay | Admitting: Physician Assistant

## 2020-09-20 NOTE — Progress Notes (Signed)
Subjective:    Patient ID: Judy Russell, female    DOB: [birth year], 65 y.o.   MRN: [id number]  HPI  Pt is a 65 yo female with recent hemorrhagic cystitis on 3/3 and treated with macrobid. She has felt much better until the last few days. She finished the macrobid. She has denies any more blood in urine. Normal pelvic u/s in ED. Her culture did grow bacteria that macrobid should treat. Concerned infection did not fully clear since symptoms have returned in the last 2 days. No fever, chills, nausea, vomiting, diarrhea, abdominal pain or flank pain. It is the weekend and she wanted to treat before anything worsened.   .. Active Ambulatory Problems    Diagnosis Date Noted  . Hypothyroidism 09/04/2009  . SEIZURE DISORDER 09/04/2009  . Fibromyalgia 05/05/2015  . 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 Thyroid activity decreased 05/05/2015  . Chronic neck pain 05/05/2015  . Dyspareunia in female 07/07/2015  . 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 Plantar wart of left foot 02/29/2016  . Menopausal symptoms 07/21/2016  . Mood disorder in conditions classified elsewhere 07/21/2016  . Left leg pain 03/18/2017  . Right foot pain 03/18/2017  . History of colon resection 07/09/2017  . History of irregular heartbeat 07/09/2017  . Sinus bradycardia by electrocardiogram 07/09/2017  . Hyperlipidemia 07/11/2017  . Elevated blood pressure reading 07/11/2017  . Memory changes 07/19/2017  . 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 Perennial allergic rhinitis 08/13/2017  . CFS (chronic fatigue syndrome) 11/27/2017  . Multiple food allergies 03/25/2018  . Environmental allergies 03/25/2018  . Callus of foot 07/29/2018  . Tonsillith 07/30/2018  . DDD (degenerative disc disease), cervical 08/21/2018  . Cervical radiculopathy 08/21/2018  . Chronic constipation 10/30/2018  . Food allergy 01/29/2019  . 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 Seborrheic keratoses 05/14/2019  . Suspected  COVID-19 virus infection 09/11/2019  . COVID-19 virus infection 09/22/2019  . History of COVID-19 10/07/2019  . Mold exposure 12/24/2019  . Chest tightness 12/24/2019  . Allergic reaction 01/09/2020  . Scalp itch 01/12/2020  . Unintended weight loss 02/18/2020  . Loss of taste 02/18/2020  . Tapeworm 04/02/2020  . Toenail fungus 09/14/2020  . Corn of toe 09/14/2020   Resolved Ambulatory Problems    Diagnosis Date Noted  . URI 09/04/2009  . Acute bronchitis 05/03/2016  . Essential hypertension 07/19/2017   Past Medical History:  Diagnosis Date  . Allergy   . HSV-2 (herpes simplex virus 2) infection   . Seizures (HCC)   . Thyroid disease        Review of Systems See HPI.     Objective:   Physical Exam Vitals reviewed.  Constitutional:      Appearance: Normal appearance.  Cardiovascular:     Rate and Rhythm: Normal rate and regular rhythm.     Pulses: Normal pulses.  Pulmonary:     Effort: Pulmonary effort is normal.  Abdominal:     General: Bowel sounds are normal. There is no distension.     Palpations: Abdomen is soft.     Tenderness: There is no abdominal tenderness. There is no right CVA tenderness, left CVA tenderness or guarding.  Neurological:     General: No focal deficit present.     Mental Status: She is alert.  Psychiatric:        Mood and Affect: Mood normal.       .. Results for orders placed or performed in visit on 09/17/20  Urine Culture  Specimen: Urine  Result Value Ref Range   MICRO NUMBER: [id number]    SPECIMEN QUALITY: Adequate    Sample Source URINE    STATUS: FINAL    Result:      Less than 10,000 CFU/mL of single Gram negative organism isolated. No further testing will be performed. If clinically indicated, recollection using a method to minimize contamination, with prompt transfer to Urine Culture Transport Tube, is recommended.  POCT URINALYSIS DIP (CLINITEK)  Result Value Ref Range   Color, UA yellow yellow   Clarity, UA  clear clear   Glucose, UA negative negative mg/dL   Bilirubin, UA negative negative   Ketones, POC UA negative negative mg/dL   Spec Grav, UA 3.662 9.476 - 1.025   Blood, UA negative negative   pH, UA 7.5 5.0 - 8.0   POC PROTEIN,UA negative negative, trace   Urobilinogen, UA 0.2 0.2 or 1.0 E.U./dL   Nitrite, UA Negative Negative   Leukocytes, UA Small (1+) (A) Negative        Assessment & Plan:  Marland KitchenMarland KitchenLashya was seen today for dysuria.  Diagnoses and all orders for this visit:  Acute cystitis without hematuria -     POCT URINALYSIS DIP (CLINITEK) -     Urine Culture -     sulfamethoxazole-trimethoprim (BACTRIM DS) 800-160 MG tablet; Take 1 tablet by mouth 2 (two) times daily. For 7 days.  Dysuria -     POCT URINALYSIS DIP (CLINITEK) -     Urine Culture  History of UTI   UA dipstick found leukocytes.  Will culture.  empirically treated based on last urine culture with Bactrim.  Will confirm clearance of infection this time with urine culture after abx is completed.  Discussed prevention.  HO given.  Follow up if any new or worsening symptoms.

## 2020-09-20 NOTE — Progress Notes (Signed)
You do have some bacteria present but in very low count. Usually we do not treat infections with less than 100,000 colonies. I think since you are symptomatic finish antibiotic and recheck urine and make sure all bacteria has cleared.

## 2020-09-24 ENCOUNTER — Encounter: Payer: Self-pay | Admitting: Physician Assistant

## 2020-11-30 ENCOUNTER — Ambulatory Visit (INDEPENDENT_AMBULATORY_CARE_PROVIDER_SITE_OTHER): Payer: Medicare Other | Admitting: Physician Assistant

## 2020-11-30 ENCOUNTER — Other Ambulatory Visit: Payer: Self-pay

## 2020-11-30 ENCOUNTER — Encounter: Payer: Self-pay | Admitting: Physician Assistant

## 2020-11-30 VITALS — BP 134/62 | HR 46 | Ht 60.0 in | Wt 111.0 lb

## 2020-11-30 DIAGNOSIS — N907 Vulvar cyst: Secondary | ICD-10-CM

## 2020-11-30 MED ORDER — DOXYCYCLINE HYCLATE 100 MG PO TABS: 100.0000 mg | ORAL_TABLET | Freq: Two times a day (BID) | ORAL | 0 refills | Status: DC

## 2020-11-30 NOTE — Progress Notes (Signed)
   Subjective:    Patient ID: Judy Russell, female    DOB: 10-18-1955, 65 y.o.   MRN: [id number]  HPI  Pt is a 65 yo female with a bump on her right labia minora that is painful. She noticed it about 2 days ago. She squeezed some "white stuff" out of it yesterday. If feels little bit better today. No fever, chills, swelling. Never had anything like this before.   Pt runs everyday. She finished 100 miles this month.   .. Active Ambulatory Problems    Diagnosis Date Noted  . Hypothyroidism 09/04/2009  . SEIZURE DISORDER 09/04/2009  . Fibromyalgia 05/05/2015  . 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 Thyroid activity decreased 05/05/2015  . Chronic neck pain 05/05/2015  . Dyspareunia in female 07/07/2015  . 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 Plantar wart of left foot 02/29/2016  . Menopausal symptoms 07/21/2016  . Mood disorder in conditions classified elsewhere 07/21/2016  . Left leg pain 03/18/2017  . Right foot pain 03/18/2017  . History of colon resection 07/09/2017  . History of irregular heartbeat 07/09/2017  . Sinus bradycardia by electrocardiogram 07/09/2017  . Hyperlipidemia 07/11/2017  . Elevated blood pressure reading 07/11/2017  . Memory changes 07/19/2017  . 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 Perennial allergic rhinitis 08/13/2017  . CFS (chronic fatigue syndrome) 11/27/2017  . Multiple food allergies 03/25/2018  . Environmental allergies 03/25/2018  . Callus of foot 07/29/2018  . Tonsillith 07/30/2018  . DDD (degenerative disc disease), cervical 08/21/2018  . Cervical radiculopathy 08/21/2018  . Chronic constipation 10/30/2018  . Food allergy 01/29/2019  . 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 Seborrheic keratoses 05/14/2019  . Suspected COVID-19 virus infection 09/11/2019  . COVID-19 virus infection 09/22/2019  . History of COVID-19 10/07/2019  . Mold exposure 12/24/2019  . Chest tightness 12/24/2019  . Allergic reaction 01/09/2020   . Scalp itch 01/12/2020  . Unintended weight loss 02/18/2020  . Loss of taste 02/18/2020  . Tapeworm 04/02/2020  . Toenail fungus 09/14/2020  . Corn of toe 09/14/2020   Resolved Ambulatory Problems    Diagnosis Date Noted  . URI 09/04/2009  . Acute bronchitis 05/03/2016  . Essential hypertension 07/19/2017   Past Medical History:  Diagnosis Date  . Allergy   . HSV-2 (herpes simplex virus 2) infection   . Seizures (HCC)   . Thyroid disease      Review of Systems See HPI.     Objective:   Physical Exam 1cm by 1cm nodule with white head, tender to touch of right labia minora      cleaned with hibiclens and small incision made and expressed some white discharge. Painful to touch.  Assessment & Plan:  Marland KitchenMarland KitchenTassie was seen today for cyst.  Diagnoses and all orders for this visit:  Vulvar cyst -     doxycycline (VIBRA-TABS) 100 MG tablet; Take 1 tablet (100 mg total) by mouth 2 (two) times daily for 7 days.   Opened it up a little more to drain. Continue epson salt water soaks and warm compresses. Keep clean with dial soap. Start doxycycline for 7 days. If not improving will need to follow up for full I and D. HO given.   Prevent with gold bond when running.

## 2020-11-30 NOTE — Patient Instructions (Signed)
Epidermoid Cyst  An epidermoid cyst, also called an epidermal cyst, is a small lump under your skin. The cyst contains a substance called keratin. Do not try to pop or open the cyst yourself. What are the causes?  A blocked hair follicle.  A hair that curls and re-enters the skin instead of growing straight out of the skin.  A blocked pore.  Irritated skin.  An injury to the skin.  Certain conditions that are passed along from parent to child.  Human papillomavirus (HPV). This happens rarely when cysts occur on the bottom of the feet.  Long-term sun damage to the skin. What increases the risk?  Having acne.  Being female.  Having an injury to the skin.  Being past puberty.  Having certain conditions caused by genes (genetic disorder) What are the signs or symptoms? These cysts are usually harmless, but they can get infected. Symptoms of infection may include:  Redness.  Inflammation.  Tenderness.  Warmth.  Fever.  A bad-smelling substance that drains from the cyst.  Pus that drains from the cyst. How is this treated? In many cases, epidermoid cysts go away on their own without treatment. If a cyst becomes infected, treatment may include:  Opening and draining the cyst, done by a doctor. After draining, you may need minor surgery to remove the rest of the cyst.  Antibiotic medicine.  Shots of medicines (steroids) that help to reduce inflammation.  Surgery to remove the cyst. Surgery may be done if the cyst: ? Becomes large. ? Bothers you. ? Has a chance of turning into cancer.  Do not try to open a cyst yourself. Follow these instructions at home: Medicines  Take over-the-counter and prescription medicines as told by your doctor.  If you were prescribed an antibiotic medicine, take it as told by your doctor. Do not stop taking it even if you start to feel better. General instructions  Keep the area around your cyst clean and dry.  Wear loose, dry  clothing.  Avoid touching your cyst.  Check your cyst every day for signs of infection. Check for: ? Redness, swelling, or pain. ? Fluid or blood. ? Warmth. ? Pus or a bad smell.  Keep all follow-up visits. How is this prevented?  Wear clean, dry, clothing.  Avoid wearing tight clothing.  Keep your skin clean and dry. Take showers or baths every day. Contact a doctor if:  Your cyst has symptoms of infection.  Your condition does not improve or gets worse.  You have a cyst that looks different from other cysts you have had.  You have a fever. Get help right away if:  Redness spreads from the cyst into the area close by. Summary  An epidermoid cyst is a small lump under your skin.  If a cyst becomes infected, treatment may include surgery to open and drain the cyst, or to remove it.  Take over-the-counter and prescription medicines only as told by your doctor.  Contact a doctor if your condition is not improving or is getting worse.  Keep all follow-up visits. This information is not intended to replace advice given to you by your health care provider. Make sure you discuss any questions you have with your health care provider. Document Revised: 09/24/2019 Document Reviewed: 09/24/2019 Elsevier Patient Education  2021 [employer]  

## 2020-12-01 ENCOUNTER — Telehealth: Payer: Self-pay | Admitting: Neurology

## 2020-12-01 ENCOUNTER — Ambulatory Visit (INDEPENDENT_AMBULATORY_CARE_PROVIDER_SITE_OTHER): Payer: Medicare Other | Admitting: Physician Assistant

## 2020-12-01 DIAGNOSIS — Z0000 Encounter for general adult medical examination without abnormal findings: Secondary | ICD-10-CM | POA: Diagnosis not present

## 2020-12-01 NOTE — Progress Notes (Signed)
MEDICARE ANNUAL WELLNESS VISIT  12/01/2020  Telephone Visit Disclaimer This Medicare AWV was conducted by telephone due to national recommendations for restrictions regarding the COVID-19 Pandemic (e.g. social distancing).  I verified, using two identifiers, that I am speaking with Judy Russell or their authorized healthcare agent. I discussed the limitations, risks, security, and privacy concerns of performing an evaluation and management service by telephone and the potential availability of an in-person appointment in the future. The patient expressed understanding and agreed to proceed.  Location of Patient: Home Location of Provider (nurse):  In the office.  Subjective:    Judy Russell is a 65 y.o. female patient of Caleen Essex, Lonna Cobb, PA-C who had a [employer] Visit today via telephone. [name] is Legally disabled and lives with their spouse. she has 2 children. she reports that she is socially active and does interact with friends/family regularly. she is moderately physically active and enjoys jogging and cooking.  Patient Care Team: Nolene Ebbs as PCP - General (Family Medicine)  Advanced Directives 12/01/2020 12/01/2020  Does Patient Have a Medical Advance Directive? No No  Would patient like information on creating a medical advance directive? No - Patient declined No - Patient declined    Hospital Utilization Over the Past 12 Months: # of hospitalizations or ER visits: 0 # of surgeries: 0  Review of Systems    Patient reports that her overall health is better compared to last year.  History obtained from chart review and the patient  Patient Reported Readings (BP, Pulse, CBG, Weight, etc) none  Pain Assessment Pain : No/denies pain     Current Medications & Allergies (verified) Allergies as of 12/01/2020      Reactions   Decadron [dexamethasone]    Jittery/anxiety/shaking   Wellbutrin [bupropion]    seizures   Atrovent [ipratropium]     Dizzy and weak after using.   Corn-containing Products Itching   Valtrex [valacyclovir Hcl]    Headache, nausea   Wheat       Medication List       Accurate as of December 01, 2020  8:24 AM. If you have any questions, ask your nurse or doctor.        albuterol 108 (90 Base) MCG/ACT inhaler Commonly known as: VENTOLIN HFA Inhale 2 puffs into the lungs every 6 (six) hours as needed.   ciclopirox 8 % solution Commonly known as: Penlac Apply topically at bedtime. Apply over nail and surrounding skin. Apply daily over previous coat. After seven (7) days, may remove with alcohol and continue cycle.   doxycycline 100 MG tablet Commonly known as: VIBRA-TABS Take 1 tablet (100 mg total) by mouth 2 (two) times daily for 7 days.   hydroxychloroquine 200 MG tablet Commonly known as: PLAQUENIL   ketoconazole 2 % shampoo Commonly known as: NIZORAL Apply 1 application topically 3 (three) times a week.   lamoTRIgine 200 MG tablet Commonly known as: LAMICTAL TAKE 1 TABLET BY MOUTH  DAILY   levothyroxine 112 MCG tablet Commonly known as: SYNTHROID Take 1 tablet (112 mcg total) by mouth daily.   polyethylene glycol 17 g packet Commonly known as: MIRALAX / GLYCOLAX Take 17 g by mouth daily.       History (reviewed): Past Medical History:  Diagnosis Date  . Allergy   . Fibromyalgia   . HSV-2 (herpes simplex virus 2) infection   . Seizures (HCC)   . Thyroid disease    Past Surgical History:  Procedure Laterality  Date  . APPENDECTOMY    . DILATION AND CURETTAGE OF UTERUS    . OOPHORECTOMY Left    Family History  Problem Relation Age of Onset  . Cancer Mother        breast  . Diabetes Mother   . Alcohol abuse Father   . Hypertension Father   . Stroke Father    Social History   Socioeconomic History  . Marital status: Married    Spouse name: [name]  . Number of children: 2  . Years of education: 3612  . Highest education level: 12th grade  Occupational History     Comment: Disabled  Tobacco Use  . Smoking status: Never Smoker  . Smokeless tobacco: Never Used  Substance and Sexual Activity  . Alcohol use: No  . Drug use: No  . Sexual activity: Not Currently  Other Topics Concern  . Not on file  Social History Narrative   Lives with her spouse. Likes to jog and cook in her free time.    Social Determinants of Health   Financial Resource Strain: Low Risk   . Difficulty of Paying Living Expenses: Not hard at all  Food Insecurity: No Food Insecurity  . Worried About Programme researcher, broadcasting/film/videounning Out of Food in the Last Year: Never true  . Ran Out of Food in the Last Year: Never true  Transportation Needs: No Transportation Needs  . Lack of Transportation (Medical): No  . Lack of Transportation (Non-Medical): No  Physical Activity: Sufficiently Active  . Days of Exercise per Week: 5 days  . Minutes of Exercise per Session: 60 min  Stress: No Stress Concern Present  . Feeling of Stress : Not at all  Social Connections: Socially Integrated  . Frequency of Communication with Friends and Family: More than three times a week  . Frequency of Social Gatherings with Friends and Family: More than three times a week  . Attends Religious Services: More than 4 times per year  . Active Member of Clubs or Organizations: Yes  . Attends BankerClub or Organization Meetings: 1 to 4 times per year  . Marital Status: Married    Activities of Daily Living In your present state of health, do you have any difficulty performing the following activities: 12/01/2020  Hearing? Y  Comment uses bilateral hearing aids.  Vision? N  Difficulty concentrating or making decisions? Y  Comment she states she has some memory issues  Walking or climbing stairs? N  Dressing or bathing? N  Doing errands, shopping? N  Preparing Food and eating ? N  Using the Toilet? N  Managing your Medications? N  Managing your Finances? N  Housekeeping or managing your Housekeeping? N  Some recent data might be  hidden    Patient Education/ Literacy How often do you need to have someone help you when you read instructions, pamphlets, or other written materials from your doctor or pharmacy?: 1 - Never What is the last grade level you completed in school?: 12th grade  Exercise Current Exercise Habits: Home exercise routine, Type of exercise: Other - see comments (running/jogging), Time (Minutes): 60, Frequency (Times/Week): 5, Weekly Exercise (Minutes/Week): 300, Intensity: Moderate, Exercise limited by: None identified  Diet Patient reports consuming 2 meals a day and 2 snack(s) a day Patient reports that her primary diet is: Regular Patient reports that she does have regular access to food.   Depression Screen PHQ 2/9 Scores 12/01/2020 12/24/2019 05/13/2019 03/25/2018 07/09/2017  PHQ - 2 Score 0 0 0 0 0  PHQ- 9 Score - 11 0 4 -     Fall Risk Fall Risk  12/01/2020 12/24/2019 05/13/2019  Falls in the past year? 0 0 0  Number falls in past yr: 0 0 0  Injury with Fall? 0 0 0  Risk for fall due to : No Fall Risks - -  Follow up Falls evaluation completed Falls evaluation completed Falls evaluation completed     Objective:  Rexann Lueras seemed alert and oriented and she participated appropriately during our telephone visit.  Blood Pressure Weight BMI  BP Readings from Last 3 Encounters:  11/30/20 134/62  09/17/20 (!) 159/85  09/14/20 (!) 145/77   Wt Readings from Last 3 Encounters:  11/30/20 111 lb (50.3 kg)  09/17/20 114 lb (51.7 kg)  09/14/20 114 lb (51.7 kg)   BMI Readings from Last 1 Encounters:  11/30/20 21.68 kg/m    *Unable to obtain current vital signs, weight, and BMI due to telephone visit type  Hearing/Vision  . Odilia did not seem to have difficulty with hearing/understanding during the telephone conversation . Reports that she has had a formal eye exam by an eye care professional within the past year . Reports that she has had a formal hearing evaluation within the past  year *Unable to fully assess hearing and vision during telephone visit type  Cognitive Function: 6CIT Screen 12/01/2020 07/19/2017  What Year? 0 points 0 points  What month? 0 points 0 points  What time? 0 points 0 points  Count back from 20 0 points 0 points  Months in reverse 2 points 0 points  Repeat phrase 2 points 2 points  Total Score 4 2   (Normal:0-7, Significant for Dysfunction: >8)  Normal Cognitive Function Screening: Yes   Immunization & Health Maintenance Record  There is no immunization history on file for this patient.  Health Maintenance  Topic Date Due  . PAP SMEAR-Modifier  12/01/2020 (Originally [birth year]15/1979)  . MAMMOGRAM  12/23/2020 (Originally 02/15/2008)  . Zoster Vaccines- Shingrix (1 of 2) 03/02/2021 (Originally 02/15/2008)  . TETANUS/TDAP  05/04/2025 (Originally 02/14/1977)  . Fecal DNA (Cologuard)  04/25/2021  . Hepatitis C Screening  Completed  . HIV Screening  Completed  . HPV VACCINES  Aged Out  . INFLUENZA VACCINE  Discontinued       Assessment  This is a routine wellness examination for Naleah Kofoed.  Health Maintenance: Due or Overdue There are no preventive care reminders to display for this patient.  Grover Woodfield does not need a referral for Community Assistance: Care Management:   no Social Work:    no Prescription Assistance:  no Nutrition/Diabetes Education:  no   Plan:  Personalized Goals Goals Addressed              This Visit's Progress   .  Patient Stated (pt-stated)        Work on medical advance directives.      Personalized Health Maintenance & Screening Recommendations  Influenza vaccine Td vaccine Screening mammography Screening Pap smear and pelvic exam   Shingles vaccine Covid vaccine  Patient declined all of the vaccines at this time along with the pap smear and mammogram.  Lung Cancer Screening Recommended: no (Low Dose CT Chest recommended if Age 58-80 years, 30 pack-year currently smoking OR have quit  w/in past 15 years) Hepatitis C Screening recommended: no HIV Screening recommended: no  Advanced Directives: Written information was not prepared per patient's request.  Referrals & Orders No orders of the defined  types were placed in this encounter.   Follow-up Plan . Follow-up with Jomarie Longs, PA-C as planned . Cologuard due in October, 2022. . Medicare wellness in one year.  . AVS printed and mailed to the patient.   I have personally reviewed and noted the following in the patient's chart:   . Medical and social history . Use of alcohol, tobacco or illicit drugs  . Current medications and supplements . Functional ability and status . Nutritional status . Physical activity . Advanced directives . List of other physicians . Hospitalizations, surgeries, and ER visits in previous 12 months . Vitals . Screenings to include cognitive, depression, and falls . Referrals and appointments  In addition, I have reviewed and discussed with Judy Russell certain preventive protocols, quality metrics, and best practice recommendations. A written personalized care plan for preventive services as well as general preventive health recommendations is available and can be mailed to the patient at her request.      Modesto Charon, RN  12/01/2020

## 2020-12-01 NOTE — Telephone Encounter (Signed)
Patient left vm stating she had a reaction to antibiotics and not going to take them. Within 1 hour of taking Doxycycline she had vomiting, diarrhea, headache, and brain fog. She doesn't want a replacement antibiotic. She is concerned about corn products in them. She will let us know if area gets bigger or more red. Kalub Morillo - FYI.

## 2020-12-01 NOTE — Telephone Encounter (Signed)
Ok please add intolerance to her allergy list.

## 2020-12-01 NOTE — Patient Instructions (Addendum)
MEDICARE ANNUAL WELLNESS VISIT Health Maintenance Summary and Written Plan of Care  Judy Russell ,  Thank you for allowing me to perform your Medicare Annual Wellness Visit and for your ongoing commitment to your health.   Health Maintenance & Immunization History Health Maintenance  Topic Date Due  . PAP SMEAR-Modifier  12/01/2020 (Originally 02/15/1979)  . MAMMOGRAM  12/23/2020 (Originally 02/15/2008)  . Zoster Vaccines- Shingrix (1 of 2) 03/02/2021 (Originally 02/15/2008)  . TETANUS/TDAP  05/04/2025 (Originally 02/14/1977)  . Fecal DNA (Cologuard)  04/25/2021  . Hepatitis C Screening  Completed  . HIV Screening  Completed  . HPV VACCINES  Aged Out  . INFLUENZA VACCINE  Discontinued    There is no immunization history on file for this patient.  These are the patient goals that we discussed: Goals Addressed              This Visit's Progress   .  Patient Stated (pt-stated)        Work on medical advance directives.        This is a list of Health Maintenance Items that are overdue or due now: Influenza vaccine Td vaccine Screening mammography Screening Pap smear and pelvic exam   Shingles vaccine Covid vaccine  Patient declined all of the vaccines at this time along with the pap smear and mammogram.  Orders/Referrals Placed Today: No orders of the defined types were placed in this encounter.  (Contact our referral department at [phone] if you have not spoken with someone about your referral appointment within the next 5 days)    Follow-up Plan . Follow-up with Jomarie Longs, PA-C as planned . Cologuard due in October, 2022. . Medicare wellness in one year.  . AVS printed and mailed to the patient.  Health Maintenance, Female Adopting a healthy lifestyle and getting preventive care are important in promoting health and wellness. Ask your health care provider about:  The right schedule for you to have regular tests and exams.  Things you can do on  your own to prevent diseases and keep yourself healthy. What should I know about diet, weight, and exercise? Eat a healthy diet  Eat a diet that includes plenty of vegetables, fruits, low-fat dairy products, and lean protein.  Do not eat a lot of foods that are high in solid fats, added sugars, or sodium.   Maintain a healthy weight Body mass index (BMI) is used to identify weight problems. It estimates body fat based on height and weight. Your health care provider can help determine your BMI and help you achieve or maintain a healthy weight. Get regular exercise Get regular exercise. This is one of the most important things you can do for your health. Most adults should:  Exercise for at least 150 minutes each week. The exercise should increase your heart rate and make you sweat (moderate-intensity exercise).  Do strengthening exercises at least twice a week. This is in addition to the moderate-intensity exercise.  Spend less time sitting. Even light physical activity can be beneficial. Watch cholesterol and blood lipids Have your blood tested for lipids and cholesterol at 63 years of age, then have this test every 5 years. Have your cholesterol levels checked more often if:  Your lipid or cholesterol levels are high.  You are older than 63 years of age.  You are at high risk for heart disease. What should I know about cancer screening? Depending on your health history and family history, you may need to have  cancer screening at various ages. This may include screening for:  Breast cancer.  Cervical cancer.  Colorectal cancer.  Skin cancer.  Lung cancer. What should I know about heart disease, diabetes, and high blood pressure? Blood pressure and heart disease  High blood pressure causes heart disease and increases the risk of stroke. This is more likely to develop in people who have high blood pressure readings, are of African descent, or are overweight.  Have your blood  pressure checked: ? Every 3-5 years if you are 78-61 years of age. ? Every year if you are 57 years old or older. Diabetes Have regular diabetes screenings. This checks your fasting blood sugar level. Have the screening done:  Once every three years after age 19 if you are at a normal weight and have a low risk for diabetes.  More often and at a younger age if you are overweight or have a high risk for diabetes. What should I know about preventing infection? Hepatitis B If you have a higher risk for hepatitis B, you should be screened for this virus. Talk with your health care provider to find out if you are at risk for hepatitis B infection. Hepatitis C Testing is recommended for:  Everyone born from [birth year] through 1963.  Anyone with known risk factors for hepatitis C. Sexually transmitted infections (STIs)  Get screened for STIs, including gonorrhea and chlamydia, if: ? You are sexually active and are younger than 63 years of age. ? You are older than 63 years of age and your health care provider tells you that you are at risk for this type of infection. ? Your sexual activity has changed since you were last screened, and you are at increased risk for chlamydia or gonorrhea. Ask your health care provider if you are at risk.  Ask your health care provider about whether you are at high risk for HIV. Your health care provider may recommend a prescription medicine to help prevent HIV infection. If you choose to take medicine to prevent HIV, you should first get tested for HIV. You should then be tested every 3 months for as long as you are taking the medicine. Pregnancy  If you are about to stop having your period (premenopausal) and you may become pregnant, seek counseling before you get pregnant.  Take 400 to 800 micrograms (mcg) of folic acid every day if you become pregnant.  Ask for birth control (contraception) if you want to prevent pregnancy. Osteoporosis and  menopause Osteoporosis is a disease in which the bones lose minerals and strength with aging. This can result in bone fractures. If you are 70 years old or older, or if you are at risk for osteoporosis and fractures, ask your health care provider if you should:  Be screened for bone loss.  Take a calcium or vitamin D supplement to lower your risk of fractures.  Be given hormone replacement therapy (HRT) to treat symptoms of menopause. Follow these instructions at home: Lifestyle  Do not use any products that contain nicotine or tobacco, such as cigarettes, e-cigarettes, and chewing tobacco. If you need help quitting, ask your health care provider.  Do not use street drugs.  Do not share needles.  Ask your health care provider for help if you need support or information about quitting drugs. Alcohol use  Do not drink alcohol if: ? Your health care provider tells you not to drink. ? You are pregnant, may be pregnant, or are planning to become pregnant.  If you drink alcohol: ? Limit how much you use to 0-1 drink a day. ? Limit intake if you are breastfeeding.  Be aware of how much alcohol is in your drink. In the [place] one drink equals one 12 oz bottle of beer (355 mL), one 5 oz glass of wine (148 mL), or one 1 oz glass of hard liquor (44 mL). General instructions  Schedule regular health, dental, and eye exams.  Stay current with your vaccines.  Tell your health care provider if: ? You often feel depressed. ? You have ever been abused or do not feel safe at home. Summary  Adopting a healthy lifestyle and getting preventive care are important in promoting health and wellness.  Follow your health care provider's instructions about healthy diet, exercising, and getting tested or screened for diseases.  Follow your health care provider's instructions on monitoring your cholesterol and blood pressure. This information is not intended to replace advice given to you by your  health care provider. Make sure you discuss any questions you have with your health care provider. Document Revised: 06/12/2018 Document Reviewed: 06/12/2018 Elsevier Patient Education  2021 [employer].

## 2020-12-01 NOTE — Telephone Encounter (Signed)
Added

## 2020-12-01 NOTE — Addendum Note (Signed)
Addended bySilvio Pate on: 12/01/2020 03:50 PM   Modules accepted: Orders

## 2020-12-06 ENCOUNTER — Other Ambulatory Visit: Payer: Self-pay

## 2020-12-06 ENCOUNTER — Encounter: Payer: Self-pay | Admitting: Physician Assistant

## 2020-12-06 ENCOUNTER — Ambulatory Visit (INDEPENDENT_AMBULATORY_CARE_PROVIDER_SITE_OTHER): Payer: Medicare Other | Admitting: Physician Assistant

## 2020-12-06 VITALS — BP 142/77 | HR 54 | Ht 60.0 in | Wt 111.0 lb

## 2020-12-06 DIAGNOSIS — N816 Rectocele: Secondary | ICD-10-CM

## 2020-12-06 DIAGNOSIS — K5909 Other constipation: Secondary | ICD-10-CM

## 2020-12-06 NOTE — Patient Instructions (Signed)
Rectal Prolapse, Adult Rectal prolapse happens when the inside of the last section of the large intestine (rectum) drops down into an abnormal position. There are two types of rectal prolapse:  Partial. In partial rectal prolapse, the inner lining of the rectum falls or sinks out of place and may stick out of the anus.  Complete. In complete rectal prolapse, all layers of the rectum fall or sink out of place and may stick out of the anus. At first, rectal prolapse may be temporary. It may happen only when you are having a bowel movement. Over time, the prolapse will likely get worse. It may start to happen more often and cause uncomfortable symptoms. Eventually, the prolapse may happen when you are walking or standing. Surgery is often needed for this condition. What are the causes? This condition may be caused by weakness in the muscles that attach the rectum to the inside of the lower abdomen. The exact cause of this muscle weakness is often not known. What increases the risk? You are more likely to develop this condition if you:  Have a history of chronic constipation or diarrhea.  Frequently strain to have a bowel movement.  Are a woman who is 63 years of age or older.  Have a neurological disorder, such as multiple sclerosis, or lower spinal cord injury.  Are a woman who has been pregnant many times.  Have had pelvic or rectal surgery.  Have cystic fibrosis. What are the signs or symptoms? The main symptom of this condition is a red mass of tissue sticking out of your anus. The mass may appear inflamed, have mucus, or bleed slightly.  At first, the mass may only appear after a bowel movement.  The mass may then start to appear more often. Other symptoms may include:  Discomfort in the anus and rectum.  Constipation.  Feeling that stool is not completely emptied from the rectum.  Diarrhea.  Leaking of stool, mucus, or blood from the anus (fecal incontinence).  Feeling  like you are sitting on a ball. How is this diagnosed? This condition may be diagnosed based on your symptoms and a physical exam.  During the exam, you may be asked to squat and strain as though you are having a bowel movement.  You may also have tests, such as: ? A rectal exam using a flexible scope (sigmoidoscopy or colonoscopy). ? A procedure that involves taking X-rays of your rectum after a dye (contrast material) is injected into the rectum (defecogram). How is this treated? This condition is usually treated with surgery to repair the weakened muscles and to reconnect the rectum to attachments inside the lower abdomen. Other treatment options may include:  Pushing the prolapsed area back into the rectum (reduction). ? Your health care provider may do this by gently pushing it back in using a moist cloth. ? The health care provider may show you how to do this at home if the prolapse occurs again.  Medicines to prevent constipation and straining. This may include laxatives or stool softeners. Follow these instructions at home: General instructions  Take over-the-counter and prescription medicines only as told by your health care provider.  Do not strain to have a bowel movement.  Do not lift anything that is heavier than 10 lb (4.5 kg), or the limit that you are told, until your health care provider says that it is safe.  Follow instructions from your health care provider about what to do if the prolapse occurs again and  does not go back in. This may involve lying on your side and using a moist cloth to gently press the lump into your rectum.  Keep all follow-up visits as told by your health care provider. This is important.   Preventing constipation To prevent or treat constipation, your health care provider may recommend that you:  Drink enough fluid to keep your urine pale yellow.  Take over-the-counter or prescription medicines.  Eat foods that are high in fiber, such as  beans, whole grains, and fresh fruits and vegetables.  Limit foods that are high in fat and processed sugars, such as fried or sweet foods.   Contact a health care provider if:  You have a fever.  Your prolapse cannot be reduced at home.  You have constipation or diarrhea.  You have mild rectal bleeding. Get help right away if:  You have very bad rectal pain.  You bleed heavily from your rectum. Summary  Rectal prolapse happens when the inside of the rectum drops down into an abnormal position. In some cases, the rectum may partially or completely push out through the anal opening.  This condition is usually treated with surgery to repair the weakened muscles and to reconnect the rectum to attachments inside the lower abdomen.  Take over-the-counter and prescription medicines only as told by your health care provider.  Follow instructions from your health care provider about what to do if the prolapse occurs again and does not go back in.  Get help right away if you have very bad rectal pain or if you bleed heavily from your rectum. This information is not intended to replace advice given to you by your health care provider. Make sure you discuss any questions you have with your health care provider. Document Revised: 12/26/2017 Document Reviewed: 12/26/2017 Elsevier Patient Education  2021 [employer].

## 2020-12-06 NOTE — Progress Notes (Signed)
Subjective:    Patient ID: Judy Russell, female    DOB: 08-27-1955, 65 y.o.   MRN: [id number]  HPI  Patient is a 65 year old female who presents to the clinic to discuss vaginal bulge that became much worse over the past 2 days.  Patient has a history of chronic constipation.  She endorses that she at times has to push her stool to the side so it will come out.  The other day she looked in her vaginal area and it looked like a mass was trying to come out.  This was after a straining hard stool.  There was no pain just pressure.  It has resolved today.  She has noticed over the years more pressure in her vaginal/rectal area.  She has had some intermittent bright red blood in stool.  She thought this was hemorrhoids. She wonders what the "bulge" was. She does have some mild OAB symptoms with no dysuria or significant leakage.    .. Active Ambulatory Problems    Diagnosis Date Noted  . Hypothyroidism 09/04/2009  . SEIZURE DISORDER 09/04/2009  . Fibromyalgia 05/05/2015  . 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 Thyroid activity decreased 05/05/2015  . Chronic neck pain 05/05/2015  . Dyspareunia in female 07/07/2015  . 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 Plantar wart of left foot 02/29/2016  . Menopausal symptoms 07/21/2016  . Mood disorder in conditions classified elsewhere 07/21/2016  . Left leg pain 03/18/2017  . Right foot pain 03/18/2017  . History of colon resection 07/09/2017  . History of irregular heartbeat 07/09/2017  . Sinus bradycardia by electrocardiogram 07/09/2017  . Hyperlipidemia 07/11/2017  . Elevated blood pressure reading 07/11/2017  . Memory changes 07/19/2017  . 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 Perennial allergic rhinitis 08/13/2017  . CFS (chronic fatigue syndrome) 11/27/2017  . Multiple food allergies 03/25/2018  . Environmental allergies 03/25/2018  . Callus of foot 07/29/2018  . Tonsillith 07/30/2018  . DDD (degenerative disc  disease), cervical 08/21/2018  . Cervical radiculopathy 08/21/2018  . Chronic constipation 10/30/2018  . Food allergy 01/29/2019  . 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 Seborrheic keratoses 05/14/2019  . Suspected COVID-19 virus infection 09/11/2019  . COVID-19 virus infection 09/22/2019  . History of COVID-19 10/07/2019  . Mold exposure 12/24/2019  . Chest tightness 12/24/2019  . Allergic reaction 01/09/2020  . Scalp itch 01/12/2020  . Unintended weight loss 02/18/2020  . Loss of taste 02/18/2020  . Tapeworm 04/02/2020  . Toenail fungus 09/14/2020  . Corn of toe 09/14/2020  . Rectocele 12/06/2020   Resolved Ambulatory Problems    Diagnosis Date Noted  . URI 09/04/2009  . Acute bronchitis 05/03/2016  . Essential hypertension 07/19/2017   Past Medical History:  Diagnosis Date  . Allergy   . HSV-2 (herpes simplex virus 2) infection   . Seizures (HCC)   . Thyroid disease       Review of Systems See HPI.     Objective:   Physical Exam Vitals reviewed.  Constitutional:      Appearance: Normal appearance.  Genitourinary:    General: Normal vulva.     Rectum: Normal.     Comments: No vaginal discharge.  No visible protrusion from vaginal canal with today's exam.  Rectal exam revealed a small bulge posterior vaginal wall with patient feeling some tenderness.   Neurological:     Mental Status: She is alert.           Assessment & Plan:  Marland KitchenMarland KitchenDawanna was  seen today for follow-up.  Diagnoses and all orders for this visit:  Rectocele   Vulvar cyst resolved.  Small rectocele. Certainly if struggling with constipation could get worse with bowel movements and worsening constipation.  Work on constipation. Consider linzess if OTC and diet changes not working.  Pt declined pelvic floor PT and referral to GYN.  Reassured I do not think she a surgical candidate based on todays exam but that could change.  HO given.  Follow up as needed.   Spent 30 minutes with patient  discussing exam and treatment plan.

## 2020-12-07 ENCOUNTER — Encounter: Payer: Self-pay | Admitting: Physician Assistant

## 2020-12-17 ENCOUNTER — Other Ambulatory Visit: Payer: Self-pay

## 2020-12-17 ENCOUNTER — Encounter: Payer: Self-pay | Admitting: Physician Assistant

## 2020-12-17 ENCOUNTER — Ambulatory Visit (INDEPENDENT_AMBULATORY_CARE_PROVIDER_SITE_OTHER): Payer: Medicare Other | Admitting: Physician Assistant

## 2020-12-17 VITALS — BP 112/49 | HR 88 | Ht 60.0 in | Wt 108.0 lb

## 2020-12-17 DIAGNOSIS — K5909 Other constipation: Secondary | ICD-10-CM

## 2020-12-17 DIAGNOSIS — N816 Rectocele: Secondary | ICD-10-CM

## 2020-12-17 NOTE — Progress Notes (Signed)
   Subjective:    Patient ID: Judy Russell, female    DOB: 08/03/1955, 65 y.o.   MRN: [id number]  HPI Pt is having some prolapse of tissue with bowel movements. No pain. Concerned and wants to talk about.   .. 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   Left leg pain 03/18/2017   Right foot pain 03/18/2017   History of colon resection 07/09/2017   History of irregular heartbeat 07/09/2017   Sinus bradycardia by electrocardiogram 07/09/2017   Hyperlipidemia 07/11/2017   Elevated blood pressure reading 07/11/2017   Memory changes 07/19/2017   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   Perennial allergic rhinitis 08/13/2017   CFS (chronic fatigue syndrome) 11/27/2017   Multiple food allergies 03/25/2018   Environmental allergies 03/25/2018   Callus of foot 07/29/2018   Tonsillith 07/30/2018   DDD (degenerative disc disease), cervical 08/21/2018   Cervical radiculopathy 08/21/2018   Chronic constipation 10/30/2018   Food allergy 01/29/2019   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   Seborrheic keratoses 05/14/2019   Suspected COVID-19 virus infection 09/11/2019   COVID-19 virus infection 09/22/2019   History of COVID-19 10/07/2019   Mold exposure 12/24/2019   Chest tightness 12/24/2019   Allergic reaction 01/09/2020   Scalp itch 01/12/2020   Unintended weight loss 02/18/2020   Loss of taste 02/18/2020   Tapeworm 04/02/2020   Toenail fungus 09/14/2020   Corn of toe 09/14/2020   Rectocele 12/06/2020   Resolved Ambulatory Problems    Diagnosis Date Noted   URI  09/04/2009   Acute bronchitis 05/03/2016   Essential hypertension 07/19/2017   Past Medical History:  Diagnosis Date   Allergy    HSV-2 (herpes simplex virus 2) infection    Seizures (HCC)    Thyroid disease       Review of Systems See HPI.     Objective:   Physical Exam  Brings in picture from when she felt the mass from vagina: appearance of rectal mucosal tissue coming from vaginal opening.      Assessment & Plan:   Marland KitchenMarland KitchenJasani was seen today for follow-up.  Diagnoses and all orders for this visit:  Rectocele  Chronic constipation  Reassured patient. Continue to work on pelvic floor therapy. Consider formal PT. Declined referral. Continue to work on constipation and keeping stools soft and not straining. Follow up with any pain or complete prolapse. Discussed red flag symptoms.

## 2020-12-30 ENCOUNTER — Other Ambulatory Visit: Payer: Self-pay | Admitting: Physician Assistant

## 2021-02-28 ENCOUNTER — Encounter: Payer: Self-pay | Admitting: Physician Assistant

## 2021-02-28 ENCOUNTER — Ambulatory Visit (INDEPENDENT_AMBULATORY_CARE_PROVIDER_SITE_OTHER): Payer: Medicare Other | Admitting: Physician Assistant

## 2021-02-28 ENCOUNTER — Other Ambulatory Visit: Payer: Self-pay

## 2021-02-28 ENCOUNTER — Other Ambulatory Visit: Payer: Self-pay | Admitting: Physician Assistant

## 2021-02-28 VITALS — BP 121/64 | HR 45 | Ht 60.0 in | Wt 110.0 lb

## 2021-02-28 DIAGNOSIS — R5383 Other fatigue: Secondary | ICD-10-CM | POA: Insufficient documentation

## 2021-02-28 DIAGNOSIS — E559 Vitamin D deficiency, unspecified: Secondary | ICD-10-CM | POA: Diagnosis not present

## 2021-02-28 DIAGNOSIS — E039 Hypothyroidism, unspecified: Secondary | ICD-10-CM

## 2021-02-28 DIAGNOSIS — Z9189 Other specified personal risk factors, not elsewhere classified: Secondary | ICD-10-CM | POA: Diagnosis not present

## 2021-02-28 DIAGNOSIS — R413 Other amnesia: Secondary | ICD-10-CM | POA: Diagnosis not present

## 2021-02-28 NOTE — Progress Notes (Signed)
Subjective:    Patient ID: Judy Russell, female    DOB: 1956/03/13, 65 y.o.   MRN: [id number]  HPI Pt is a 65 yo female with hypothyroidism, chronic constipation, mood disorder who presents to the clinic to discuss "feeling off".   Pt has felt lack of motivation and no energy for the last month or so. She denies any medication changes. No edema. No bowel changes. She was running but has lost a lot of interest. She denies any SI/HC. She is sleeping ok.   .. 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   Left leg pain 03/18/2017   Right foot pain 03/18/2017   History of colon resection 07/09/2017   History of irregular heartbeat 07/09/2017   Sinus bradycardia by electrocardiogram 07/09/2017   Hyperlipidemia 07/11/2017   Elevated blood pressure reading 07/11/2017   Memory changes 07/19/2017   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   Perennial allergic rhinitis 08/13/2017   CFS (chronic fatigue syndrome) 11/27/2017   Multiple food allergies 03/25/2018   Environmental allergies 03/25/2018   Callus of foot 07/29/2018   Tonsillith 07/30/2018   DDD (degenerative disc disease), cervical 08/21/2018   Cervical radiculopathy 08/21/2018   Chronic constipation 10/30/2018   Food allergy 01/29/2019   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   Seborrheic keratoses 05/14/2019   Suspected COVID-19 virus infection 09/11/2019   COVID-19 virus infection 09/22/2019   History of COVID-19 10/07/2019   Mold exposure 12/24/2019   Chest tightness 12/24/2019   Allergic reaction 01/09/2020    Scalp itch 01/12/2020   Unintended weight loss 02/18/2020   Loss of taste 02/18/2020   Tapeworm 04/02/2020   Toenail fungus 09/14/2020   Corn of toe 09/14/2020   Rectocele 12/06/2020   Resolved Ambulatory Problems    Diagnosis Date Noted   URI 09/04/2009   Acute bronchitis 05/03/2016   Essential hypertension 07/19/2017   Past Medical History:  Diagnosis Date   Allergy    HSV-2 (herpes simplex virus 2) infection    Seizures (HCC)    Thyroid disease        Review of Systems See HPI.     Objective:   Physical Exam Vitals reviewed.  Constitutional:      Appearance: Normal appearance.  HENT:     Head: Normocephalic.  Neck:     Comments: No thyroid enlargement.  Cardiovascular:     Rate and Rhythm: Normal rate and regular rhythm.     Pulses: Normal pulses.  Pulmonary:     Effort: Pulmonary effort is normal.  Musculoskeletal:     Cervical back: Normal range of motion and neck supple.     Right lower leg: No edema.     Left lower leg: No edema.  Lymphadenopathy:     Cervical: No cervical adenopathy.  Neurological:     General: No focal deficit present.     Mental Status: She is alert and oriented to person, place, and time.  Psychiatric:        Mood and Affect: Mood normal.   .. Depression screen [facility] 2/9 02/28/2021 12/17/2020 12/01/2020 12/24/2019 05/13/2019  Decreased Interest 1 1  0 0 0  Down, Depressed, Hopeless 1 0 0 0 0  PHQ - 2 Score 2 1 0 0 0  Altered sleeping 0 1 - 2 0  Tired, decreased energy 2 1 - 2 0  Change in appetite 0 0 - 1 0  Feeling bad or failure about yourself  2 0 - 1 0  Trouble concentrating 0 0 - 3 0  Moving slowly or fidgety/restless 0 0 - 2 0  Suicidal thoughts 0 0 - 0 0  PHQ-9 Score 6 3 - 11 0  Difficult doing work/chores Somewhat difficult Somewhat difficult - - Not difficult at all   .[name] GAD 7 : Generalized Anxiety Score 02/28/2021 12/17/2020 05/13/2019 03/25/2018  Nervous, Anxious, on Edge 1 0 0 0  Control/stop worrying 0 0 0 0  Worry  too much - different things 1 0 0 1  Trouble relaxing 0 0 0 0  Restless 0 0 0 1  Easily annoyed or irritable 1 0 0 0  Afraid - awful might happen 0 0 0 -  Total GAD 7 Score 3 0 0 -  Anxiety Difficulty Not difficult at all Not difficult at all Not difficult at all Somewhat difficult            Assessment & Plan:  Marland KitchenMarland KitchenMckinsey was seen today for thyroid problem.  Diagnoses and all orders for this visit:  Hypothyroidism, unspecified type -     Thyroid Panel With TSH -     COMPLETE METABOLIC PANEL WITH GFR  No energy -     Thyroid Panel With TSH -     VITAMIN D 25 Hydroxy (Vit-D Deficiency, Fractures) -     B12 and Folate Panel -     CBC with Differential/Platelet -     COMPLETE METABOLIC PANEL WITH GFR  Lack of motivation -     Thyroid Panel With TSH -     VITAMIN D 25 Hydroxy (Vit-D Deficiency, Fractures) -     B12 and Folate Panel -     CBC with Differential/Platelet -     COMPLETE METABOLIC PANEL WITH GFR  PHQ/GAD numbers are up some.  Pt declines any medication intervention.  Encouraged to keep up exercise.  Labs to check thyroid and other causes to low energy.  Will follow up as needed.  Follow up in 3 months.

## 2021-03-01 LAB — VITAMIN D 25 HYDROXY (VIT D DEFICIENCY, FRACTURES): Vit D, 25-Hydroxy: 23 ng/mL — ABNORMAL LOW (ref 30–100)

## 2021-03-01 LAB — COMPLETE METABOLIC PANEL WITH GFR
AG Ratio: 2.1 (calc) (ref 1.0–2.5)
ALT: 29 U/L (ref 6–29)
AST: 37 U/L — ABNORMAL HIGH (ref 10–35)
Albumin: 4.2 g/dL (ref 3.6–5.1)
Alkaline phosphatase (APISO): 66 U/L (ref 37–153)
BUN: 19 mg/dL (ref 7–25)
CO2: 27 mmol/L (ref 20–32)
Calcium: 9.5 mg/dL (ref 8.6–10.4)
Chloride: 106 mmol/L (ref 98–110)
Creat: 1.01 mg/dL (ref 0.50–1.05)
Globulin: 2 g/dL (calc) (ref 1.9–3.7)
Glucose, Bld: 77 mg/dL (ref 65–99)
Potassium: 4.6 mmol/L (ref 3.5–5.3)
Sodium: 142 mmol/L (ref 135–146)
Total Bilirubin: 0.3 mg/dL (ref 0.2–1.2)
Total Protein: 6.2 g/dL (ref 6.1–8.1)
eGFR: 63 mL/min/{1.73_m2} (ref >=60)

## 2021-03-01 LAB — B12 AND FOLATE PANEL
Folate: 7.6 ng/mL
Vitamin B-12: 372 pg/mL (ref 200–1100)

## 2021-03-01 LAB — CBC WITH DIFFERENTIAL/PLATELET
Absolute Monocytes: 374 cells/uL (ref 200–950)
Basophils Absolute: 38 cells/uL (ref 0–200)
Basophils Relative: 0.8 %
Eosinophils Absolute: 120 cells/uL (ref 15–500)
Eosinophils Relative: 2.5 %
HCT: 42.6 % (ref 35.0–45.0)
Hemoglobin: 14.3 g/dL (ref 11.7–15.5)
Lymphs Abs: 1368 cells/uL (ref 850–3900)
MCH: 30.3 pg (ref 27.0–33.0)
MCHC: 33.6 g/dL (ref 32.0–36.0)
MCV: 90.3 fL (ref 80.0–100.0)
MPV: 9.5 fL (ref 7.5–12.5)
Monocytes Relative: 7.8 %
Neutro Abs: 2899 cells/uL (ref 1500–7800)
Neutrophils Relative %: 60.4 %
Platelets: 277 10*3/uL (ref 140–400)
RBC: 4.72 10*6/uL (ref 3.80–5.10)
RDW: 12.4 % (ref 11.0–15.0)
Total Lymphocyte: 28.5 %
WBC: 4.8 10*3/uL (ref 3.8–10.8)

## 2021-03-01 LAB — THYROID PANEL WITH TSH
Free Thyroxine Index: 2.6 (ref 1.4–3.8)
T3 Uptake: 32 % (ref 22–35)
T4, Total: 8.2 ug/dL (ref 5.1–11.9)
TSH: 7.79 mIU/L — ABNORMAL HIGH (ref 0.40–4.50)

## 2021-03-01 NOTE — Progress Notes (Signed)
Vi,   B12 a little low normal. Make sure taking b12 daily.  Vitamin D is low. How much are you taking right now?  No anemia.  TSH is showing a trend towards hypothyroid again. If taking the daily on empty stomach then we need to increase to daily. Recheck in 6-8 weeks.

## 2021-03-02 ENCOUNTER — Encounter: Payer: Self-pay | Admitting: Physician Assistant

## 2021-03-08 MED ORDER — LEVOTHYROXINE SODIUM 125 MCG PO TABS: 125.0000 ug | ORAL_TABLET | Freq: Every day | ORAL | 0 refills | Status: DC

## 2021-03-08 NOTE — Telephone Encounter (Signed)
A RX for the new thyroid medication dose was not sent to the pharmacy.  Please review RX I have pended.  Tiajuana Amass, CMA

## 2021-03-10 ENCOUNTER — Ambulatory Visit (INDEPENDENT_AMBULATORY_CARE_PROVIDER_SITE_OTHER): Payer: Medicare Other | Admitting: Osteopathic Medicine

## 2021-03-10 DIAGNOSIS — F32A Depression, unspecified: Secondary | ICD-10-CM

## 2021-03-10 NOTE — Patient Instructions (Signed)
Restart Synthroid (Levothyroxine) 112 mcg  For immediate mental health services open 24 hours and no referral needed:    [facility], [address], [place], [place] [address], [phone]    [facility], [address] [place], [place] [address], [phone]    [facility], [address], [place], [place] [address], [phone]    Any emergency room or 911    [facility], [place]

## 2021-03-10 NOTE — Progress Notes (Signed)
Established Patient Nurse Visit  Subjective:  Patient ID: Judy Russell, female    DOB: 1956/05/27  Age: 65 y.o. MRN: [id number]  CC:  Chief Complaint  Patient presents with   Medication Problem    HPI [name] presents for depression. Patient states since she switched to Levothyroxine 125 mcg daily that she has been extremely depressed. She can not stop crying. When asked about suicidal/homicidal ideation she states, "I just want to go home and not [place]." She is very tearful.   Past Medical History:  Diagnosis Date   Allergy    Fibromyalgia    HSV-2 (herpes simplex virus 2) infection    Seizures ([place])    Thyroid disease     Past Surgical History:  Procedure Laterality Date   APPENDECTOMY     DILATION AND CURETTAGE OF UTERUS     OOPHORECTOMY Left     Family History  Problem Relation Age of Onset   Cancer Mother        breast   Diabetes Mother    Alcohol abuse Father    Hypertension Father    Stroke Father     Social History   Socioeconomic History   Marital status: Married    Spouse name: Cornelia Copa   Number of children: 2   Years of education: 12   Highest education level: 12th grade  Occupational History    Comment: Disabled  Tobacco Use   Smoking status: Never   Smokeless tobacco: Never  Substance and Sexual Activity   Alcohol use: No   Drug use: No   Sexual activity: Not Currently  Other Topics Concern   Not on file  Social History Narrative   Lives with her spouse. Likes to jog and cook in her free time.    Social Determinants of Health   Financial Resource Strain: Low Risk    Difficulty of Paying Living Expenses: Not hard at all  Food Insecurity: No Food Insecurity   Worried About Charity fundraiser in the Last Year: Never true   [place] in the Last Year: Never true  Transportation Needs: No Transportation Needs   Lack of Transportation (Medical): No   Lack of Transportation (Non-Medical): No  Physical Activity:  Sufficiently Active   Days of Exercise per Week: 5 days   Minutes of Exercise per Session: 60 min  Stress: No Stress Concern Present   Feeling of Stress : Not at all  Social Connections: Socially Integrated   Frequency of Communication with Friends and Family: More than three times a week   Frequency of Social Gatherings with Friends and Family: More than three times a week   Attends Religious Services: More than 4 times per year   Active Member of [employer] or Organizations: Yes   Attends Archivist Meetings: 1 to 4 times per year   Marital Status: Married  Human resources officer Violence: Not At Risk   Fear of Current or Ex-Partner: No   Emotionally Abused: No   Physically Abused: No   Sexually Abused: No    Outpatient Medications Prior to Visit  Medication Sig Dispense Refill   hydroxychloroquine (PLAQUENIL) 200 MG tablet      lamoTRIgine (LAMICTAL) 200 MG tablet TAKE 1 TABLET BY MOUTH  DAILY 90 tablet 3   levothyroxine (SYNTHROID) 112 MCG tablet TAKE 1 TABLET BY MOUTH  DAILY 90 tablet 1   levothyroxine (SYNTHROID) 125 MCG tablet Take 1 tablet (125 mcg total) by mouth daily. [place]  tablet 0   polyethylene glycol (MIRALAX / GLYCOLAX) 17 g packet Take 17 g by mouth daily.     No facility-administered medications prior to visit.    Allergies  Allergen Reactions   Decadron [Dexamethasone]     Jittery/anxiety/shaking   Wellbutrin [Bupropion]     seizures   Atrovent [Ipratropium]     Dizzy and weak after using.   Doxycycline     Vomiting, diarrhea, brain fog, headache   Corn-Containing Products Itching   Valtrex [Valacyclovir Hcl]     Headache, nausea   Wheat     ROS Review of Systems  Constitutional:  Positive for activity change.  Psychiatric/Behavioral:  Positive for dysphoric mood.      Objective:    Physical Exam  There were no vitals taken for this visit. Wt Readings from Last 3 Encounters:  02/28/21 110 lb (49.9 kg)  12/17/20 108 lb (49 kg)  12/06/20 111 lb  (50.3 kg)     Health Maintenance Due  Topic Date Due   Zoster Vaccines- Shingrix (1 of 2) Never done   PAP SMEAR-Modifier  Never done    There are no preventive care reminders to display for this patient.  Lab Results  Component Value Date   TSH 7.79 (H) 02/28/2021   Lab Results  Component Value Date   WBC 4.8 02/28/2021   HGB 14.3 02/28/2021   HCT 42.6 02/28/2021   MCV 90.3 02/28/2021   PLT 277 02/28/2021   Lab Results  Component Value Date   NA 142 02/28/2021   K 4.6 02/28/2021   CO2 27 02/28/2021   GLUCOSE 77 02/28/2021   BUN 19 02/28/2021   CREATININE 1.01 02/28/2021   BILITOT 0.3 02/28/2021   ALKPHOS 76 07/21/2016   AST 37 (H) 02/28/2021   ALT 29 02/28/2021   PROT 6.2 02/28/2021   ALBUMIN 4.4 07/21/2016   CALCIUM 9.5 02/28/2021   EGFR 63 02/28/2021   Lab Results  Component Value Date   CHOL 267 (H) 05/13/2019   Lab Results  Component Value Date   HDL 67 05/13/2019   Lab Results  Component Value Date   LDLCALC 166 (H) 05/13/2019   Lab Results  Component Value Date   TRIG 186 (H) 05/13/2019   Lab Results  Component Value Date   CHOLHDL 4.0 05/13/2019   No results found for: HGBA1C    Assessment & Plan:   Problem List Items Addressed This Visit   None   Spoke with Dr. Sheppard Coil about patient. Agreed to decrease Levothyroxine back to previous dosage of 112 mcg daily. I did discuss with patient that it may be time to address changes to her mood medication. She has been on Lamictal 200 mg for 22 years. She will try decreasing Levothyroxine back to her previous dosage and if no better by Monday she will call the clinic back to discuss her mood. Handout given with information for emergency mental health care if needed.    Follow-up: as needed

## 2021-03-10 NOTE — Progress Notes (Signed)
Reviewed and agree w/ nurse note

## 2021-03-18 ENCOUNTER — Ambulatory Visit (INDEPENDENT_AMBULATORY_CARE_PROVIDER_SITE_OTHER): Payer: Medicare Other | Admitting: Physician Assistant

## 2021-03-18 ENCOUNTER — Encounter: Payer: Self-pay | Admitting: Physician Assistant

## 2021-03-18 VITALS — BP 145/72 | HR 51 | Temp 98.5°F | Ht 60.0 in | Wt 111.0 lb

## 2021-03-18 DIAGNOSIS — E039 Hypothyroidism, unspecified: Secondary | ICD-10-CM

## 2021-03-18 DIAGNOSIS — R4586 Emotional lability: Secondary | ICD-10-CM

## 2021-03-18 DIAGNOSIS — F3160 Bipolar disorder, current episode mixed, unspecified: Secondary | ICD-10-CM

## 2021-03-18 NOTE — Patient Instructions (Signed)
Increase lamictal to 1 and 1/4 tablet.

## 2021-03-18 NOTE — Progress Notes (Signed)
Subjective:    Patient ID: Judy Russell, female    DOB: 05-Jul-1955, 65 y.o.   MRN: [id number]  HPI Pt is a 65 yo female with hypothyroidism and bipolar who presents to the clinic for follow up. She had a really bad low over the past few weeks. Her TSH was up and levothyroxine was increased. She did not do well with increase because of the way pill was cut differently. She came into clinic and talked with [facility] CMA. She was instructed to go back to previous thyroid dose and follow up. Since then she had a "apple pie miracle". She was feeling really low and while she was talking to one of her friends she said she was going to make an apple pie and then she started laughing. She has not stopped laughing. She feels "really happy and good". She feels like the guilt and depression and left her. She admits sudden mood changes have been happening more. She has been on same dose of Lamictal for years. She has started running again and has her motivation back.   .. 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   Left leg pain 03/18/2017   Right foot pain 03/18/2017   History of colon resection 07/09/2017   History of irregular heartbeat 07/09/2017   Sinus bradycardia by electrocardiogram 07/09/2017   Hyperlipidemia 07/11/2017   Elevated blood pressure reading 07/11/2017   Memory changes 07/19/2017   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   Perennial allergic rhinitis 08/13/2017   CFS (chronic fatigue syndrome) 11/27/2017   Multiple food allergies 03/25/2018   Environmental  allergies 03/25/2018   Callus of foot 07/29/2018   Tonsillith 07/30/2018   DDD (degenerative disc disease), cervical 08/21/2018   Cervical radiculopathy 08/21/2018   Chronic constipation 10/30/2018   Food allergy 01/29/2019   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   Seborrheic keratoses 05/14/2019   Suspected COVID-19 virus infection 09/11/2019   COVID-19 virus infection 09/22/2019   History of COVID-19 10/07/2019   Mold exposure 12/24/2019   Chest tightness 12/24/2019   Allergic reaction 01/09/2020   Scalp itch 01/12/2020   Unintended weight loss 02/18/2020   Loss of taste 02/18/2020   Tapeworm 04/02/2020   Toenail fungus 09/14/2020   Corn of toe 09/14/2020   Rectocele 12/06/2020   No energy 02/28/2021   Lack of motivation 02/28/2021   Bipolar 1 disorder, mixed (HCC) 03/21/2021   Resolved Ambulatory Problems    Diagnosis Date Noted   URI 09/04/2009   Acute bronchitis 05/03/2016   Essential hypertension 07/19/2017   Past Medical History:  Diagnosis Date   Allergy    HSV-2 (herpes simplex virus 2) infection    Seizures (HCC)    Thyroid disease       Review of Systems See HPI.     Objective:   Physical Exam Vitals reviewed.  Constitutional:      Appearance: Normal appearance.  HENT:     Head: Normocephalic.  Cardiovascular:     Rate and Rhythm: Normal rate and regular rhythm.  Pulmonary:     Effort: Pulmonary effort is normal.  Neurological:  General: No focal deficit present.     Mental Status: She is alert.  Psychiatric:        Mood and Affect: Mood normal.         .. Depression screen [facility] 2/9 03/18/2021 02/28/2021 12/17/2020 12/01/2020 12/24/2019  Decreased Interest 0 1 1 0 0  Down, Depressed, Hopeless 0 1 0 0 0  PHQ - 2 Score 0 2 1 0 0  Altered sleeping 0 0 1 - 2  Tired, decreased energy 0 2 1 - 2  Change in appetite 0 0 0 - 1  Feeling bad or failure about yourself  0 2 0 - 1  Trouble concentrating 0 0 0 - 3  Moving slowly or fidgety/restless 0 0 0 - 2   Suicidal thoughts 0 0 0 - 0  PHQ-9 Score 0 6 3 - 11  Difficult doing work/chores Not difficult at all Somewhat difficult Somewhat difficult - -   .[name] GAD 7 : Generalized Anxiety Score 03/18/2021 02/28/2021 12/17/2020 05/13/2019  Nervous, Anxious, on Edge 0 1 0 0  Control/stop worrying 0 0 0 0  Worry too much - different things 0 1 0 0  Trouble relaxing 0 0 0 0  Restless 0 0 0 0  Easily annoyed or irritable 1 1 0 0  Afraid - awful might happen 0 0 0 0  Total GAD 7 Score 1 3 0 0  Anxiety Difficulty Not difficult at all Not difficult at all Not difficult at all Not difficult at all     Assessment & Plan:  Marland KitchenMarland KitchenGladiola was seen today for depression and anxiety.  Diagnoses and all orders for this visit:  Bipolar 1 disorder, mixed (HCC)  Mood changes  Hypothyroidism, unspecified type  Discussed mood changes. In the past she has felt better after increasing her lamictal. Increased to 1 and 1/4-1/2 daily. Follow up in 4 weeks. I would like to recheck thyroid then. She went back to same dose. I do not want her thyroid to go too far out of range and cause other symptoms. Not sure if thyroid medication is the complete cause of mood changes that occurred.

## 2021-03-21 ENCOUNTER — Encounter: Payer: Self-pay | Admitting: Physician Assistant

## 2021-03-21 DIAGNOSIS — F3160 Bipolar disorder, current episode mixed, unspecified: Secondary | ICD-10-CM | POA: Insufficient documentation

## 2021-03-31 MED ORDER — LEVOTHYROXINE SODIUM 125 MCG PO TABS: 125.0000 ug | ORAL_TABLET | Freq: Every day | ORAL | 0 refills | Status: DC

## 2021-03-31 NOTE — Addendum Note (Signed)
Addended by: Stan Head on: 03/31/2021 09:25 AM   Modules accepted: Orders

## 2021-04-13 ENCOUNTER — Other Ambulatory Visit: Payer: Self-pay | Admitting: Physician Assistant

## 2021-04-13 DIAGNOSIS — F0630 Mood disorder due to known physiological condition, unspecified: Secondary | ICD-10-CM

## 2021-04-28 ENCOUNTER — Ambulatory Visit (INDEPENDENT_AMBULATORY_CARE_PROVIDER_SITE_OTHER): Payer: Medicare Other | Admitting: Physician Assistant

## 2021-04-28 ENCOUNTER — Encounter: Payer: Self-pay | Admitting: Physician Assistant

## 2021-04-28 VITALS — BP 152/52 | HR 49 | Ht 60.0 in | Wt 113.0 lb

## 2021-04-28 DIAGNOSIS — S76312A Strain of muscle, fascia and tendon of the posterior muscle group at thigh level, left thigh, initial encounter: Secondary | ICD-10-CM | POA: Diagnosis not present

## 2021-04-28 DIAGNOSIS — E039 Hypothyroidism, unspecified: Secondary | ICD-10-CM | POA: Diagnosis not present

## 2021-04-28 MED ORDER — DICLOFENAC SODIUM 1 % EX GEL: 4.0000 g | Freq: Four times a day (QID) | CUTANEOUS | 0 refills | Status: DC

## 2021-04-28 NOTE — Progress Notes (Signed)
Subjective:    Patient ID: Judy Russell, female    DOB: 1956-03-16, 65 y.o.   MRN: [id number]  HPI Pt is a 65 yo female who presents to the clinic with left buttock and posterior upper thigh pain. Pt is a runner and running makes it worse. Denies any known injury/pop. Gradually getting worse. Denies any low back pain. Not tried anything to make better.    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   Left leg pain 03/18/2017   Right foot pain 03/18/2017   History of colon resection 07/09/2017   History of irregular heartbeat 07/09/2017   Sinus bradycardia by electrocardiogram 07/09/2017   Hyperlipidemia 07/11/2017   Elevated blood pressure reading 07/11/2017   Memory changes 07/19/2017   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   Perennial allergic rhinitis 08/13/2017   CFS (chronic fatigue syndrome) 11/27/2017   Multiple food allergies 03/25/2018   Environmental allergies 03/25/2018   Callus of foot 07/29/2018   Tonsillith 07/30/2018   DDD (degenerative disc disease), cervical 08/21/2018   Cervical radiculopathy 08/21/2018   Chronic constipation 10/30/2018   Food allergy 01/29/2019   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   Seborrheic keratoses 05/14/2019   Suspected COVID-19 virus infection 09/11/2019   COVID-19 virus infection 09/22/2019   History of COVID-19 10/07/2019   Mold exposure 12/24/2019   Chest tightness 12/24/2019   Allergic reaction 01/09/2020   Scalp itch 01/12/2020   Unintended weight loss 02/18/2020   Loss of taste 02/18/2020   Tapeworm  04/02/2020   Toenail fungus 09/14/2020   Corn of toe 09/14/2020   Rectocele 12/06/2020   No energy 02/28/2021   Lack of motivation 02/28/2021   Bipolar 1 disorder, mixed (HCC) 03/21/2021   Hamstring strain, left, initial encounter 04/28/2021   Resolved Ambulatory Problems    Diagnosis Date Noted   URI 09/04/2009   Acute bronchitis 05/03/2016   Essential hypertension 07/19/2017   Past Medical History:  Diagnosis Date   Allergy    HSV-2 (herpes simplex virus 2) infection    Seizures (HCC)    Thyroid disease     Review of Systems See HPI.     Objective:   Physical Exam Vitals reviewed.  Constitutional:      Appearance: Normal appearance.  HENT:     Head: Normocephalic.  Cardiovascular:     Rate and Rhythm: Normal rate.  Musculoskeletal:     Comments: NROM at waist.  No tenderness over low back or lumbar spine.  Tenderness over hamstring insertion.  Tightness with SLR, right but no radiation of pain.   Neurological:     General: No focal deficit present.     Mental Status: She is alert.          Assessment & Plan:  Marland KitchenMarland KitchenAnagha was seen today for leg pain.  Diagnoses and all orders for this visit:  Hamstring strain, left, initial encounter -     diclofenac Sodium (VOLTAREN) 1 % GEL; Apply 4 g topically 4 (four) times daily. To affected joint.  Hypothyroidism, unspecified type -     TSH -     T4, free  Recheck TSH for medication adjustment if needed.   Discussed pain sounds like hamstring strain. She is a runner. HO given. Discussed stretching well before runs. Use diclofenac gel as needed. Consider icing and using tens unit. Stop running for next 2 weeks and walk only. Let me know if not improving or worsening.

## 2021-04-28 NOTE — Patient Instructions (Signed)
Hamstring Strain A hamstring strain happens when the muscles in the back of the thighs (hamstring muscles) are overstretched or torn. The hamstring muscles are used in straightening the hips, bending the knees, and pulling back the legs. This injury is often called a pulled hamstring muscle. The tissue that connects the muscle to a bone (tendon) may also be affected. The severity of a hamstring strain may be rated in degrees or grades. First-degree (or grade 1) strains have the least amount of muscle tearing and pain. Second-degree and third-degree (grade 2 and 3) strains have increasingly more tearing and pain. What are the causes? This condition is caused by a sudden, violent force being placed on the hamstring muscles, stretching them too far. This often happens during activities that involve running, jumping, kicking, or weight lifting. What increases the risk? Hamstring strains are especially common in athletes. The following factors may also make you more likely to develop this condition: Having low strength, endurance, or flexibility of the hamstring muscles. Doing high-impact physical activity or sports. Having poor physical fitness. Having a previous leg injury. Having tired (fatigued) muscles. What are the signs or symptoms? Symptoms of this condition include: Pain in the back of the thigh. Swelling. Bruising. Muscle spasms. Trouble moving the affected muscle because of pain. For severe strains, you may feel popping or snapping in the back of your thigh when the injury occurs. How is this diagnosed? This condition is diagnosed based on your symptoms, your medical history, and a physical exam. How is this treated? Treatment for this condition usually involves: Protecting, resting, icing, applying compression, and elevating the injured area (PRICE therapy). Medicines. Your health care provider may recommend medicines to help reduce pain or inflammation. Doing exercises to regain  strength and flexibility in the muscles. Your health care provider will tell you when it is okay to begin exercising. Follow these instructions at home: PRICE therapy Use PRICE therapy to promote muscle healing during the first 2-3 days after your injury, or as told by your health care provider. Protect the muscle from being injured again. Rest your injury. This usually involves limiting your normal activities and not using the injured hamstring muscle. Talk with your health care provider about how you should limit your activities. Apply ice to the injured area: Put ice in a plastic bag. Place a towel between your skin and the bag. Leave the ice on for 20 minutes, 2-3 times a day. After the third day, switch to applying heat as told. Put pressure (compression) on your injured hamstring by wrapping it with an elastic bandage. Be careful not to wrap it too tightly. That may interfere with blood circulation or may increase swelling. Raise (elevate) your injured hamstring above the level of your heart as often as possible. When you are lying down, you can do this by putting a pillow under your thigh.  Activity Begin exercising or stretching only as told by your health care provider. Do not return to full activity level until your health care provider approves. To help prevent muscle strains in the future, always warm up before exercising and stretch afterward. General instructions Take over-the-counter and prescription medicines only as told by your health care provider. If directed, apply heat to the affected area as often as told by your health care provider. Use the heat source that your health care provider recommends, such as a moist heat pack or a heating pad. Place a towel between your skin and the heat source. Leave the heat   on for 20-30 minutes. Remove the heat if your skin turns bright red. This is especially important if you are unable to feel pain, heat, or cold. You may have a greater  risk of getting burned. Keep all follow-up visits as told by your health care provider. This is important. Contact a health care provider if you have: Increasing pain or swelling in the injured area. Numbness, tingling, or a significant loss of strength in the injured area. Get help right away if: Your foot or your toes become cold or turn blue. Summary A hamstring strain happens when the muscles in the back of the thighs (hamstring muscles) are overstretched or torn. This injury can be caused by a sudden, violent force being placed on the hamstring muscles, causing them to stretch too far. Symptoms include pain, swelling, and muscle spasms in the injured area. Treatment includes what is called PRICE therapy: protecting, resting, icing, applying compression, and elevating the injured area. This information is not intended to replace advice given to you by your health care provider. Make sure you discuss any questions you have with your health care provider. Document Revised: 02/20/2020 Document Reviewed: 02/20/2020 Elsevier Patient Education  2022 [employer]  

## 2021-04-29 LAB — T4, FREE: Free T4: 1.6 ng/dL (ref 0.8–1.8)

## 2021-04-29 LAB — TSH: TSH: 2.38 mIU/L (ref 0.40–4.50)

## 2021-04-29 NOTE — Progress Notes (Signed)
Thyroid looks really good. This is a good dose for you. Recheck in 3 months.

## 2021-05-16 ENCOUNTER — Encounter: Payer: Self-pay | Admitting: Physician Assistant

## 2021-05-16 MED ORDER — LEVOTHYROXINE SODIUM 125 MCG PO TABS: 125.0000 ug | ORAL_TABLET | Freq: Every day | ORAL | 1 refills | Status: DC

## 2021-05-20 ENCOUNTER — Encounter: Payer: Self-pay | Admitting: Physician Assistant

## 2021-05-20 DIAGNOSIS — R109 Unspecified abdominal pain: Secondary | ICD-10-CM | POA: Diagnosis not present

## 2021-05-20 DIAGNOSIS — Z79899 Other long term (current) drug therapy: Secondary | ICD-10-CM | POA: Diagnosis not present

## 2021-05-20 DIAGNOSIS — K2900 Acute gastritis without bleeding: Secondary | ICD-10-CM | POA: Diagnosis not present

## 2021-05-20 DIAGNOSIS — Z91018 Allergy to other foods: Secondary | ICD-10-CM | POA: Diagnosis not present

## 2021-05-20 DIAGNOSIS — K8020 Calculus of gallbladder without cholecystitis without obstruction: Secondary | ICD-10-CM | POA: Diagnosis not present

## 2021-05-20 DIAGNOSIS — E079 Disorder of thyroid, unspecified: Secondary | ICD-10-CM | POA: Diagnosis not present

## 2021-05-20 DIAGNOSIS — K760 Fatty (change of) liver, not elsewhere classified: Secondary | ICD-10-CM | POA: Diagnosis not present

## 2021-05-20 DIAGNOSIS — R9431 Abnormal electrocardiogram [ECG] [EKG]: Secondary | ICD-10-CM | POA: Diagnosis not present

## 2021-05-20 DIAGNOSIS — K5900 Constipation, unspecified: Secondary | ICD-10-CM | POA: Diagnosis not present

## 2021-05-20 DIAGNOSIS — S76312A Strain of muscle, fascia and tendon of the posterior muscle group at thigh level, left thigh, initial encounter: Secondary | ICD-10-CM

## 2021-05-20 DIAGNOSIS — R1013 Epigastric pain: Secondary | ICD-10-CM | POA: Diagnosis not present

## 2021-05-20 DIAGNOSIS — K2970 Gastritis, unspecified, without bleeding: Secondary | ICD-10-CM | POA: Diagnosis not present

## 2021-05-20 DIAGNOSIS — R160 Hepatomegaly, not elsewhere classified: Secondary | ICD-10-CM | POA: Diagnosis not present

## 2021-05-20 DIAGNOSIS — Z888 Allergy status to other drugs, medicaments and biological substances status: Secondary | ICD-10-CM | POA: Diagnosis not present

## 2021-05-22 DIAGNOSIS — R202 Paresthesia of skin: Secondary | ICD-10-CM | POA: Diagnosis not present

## 2021-05-22 DIAGNOSIS — S0003XA Contusion of scalp, initial encounter: Secondary | ICD-10-CM | POA: Diagnosis not present

## 2021-05-22 DIAGNOSIS — Z888 Allergy status to other drugs, medicaments and biological substances status: Secondary | ICD-10-CM | POA: Diagnosis not present

## 2021-05-22 DIAGNOSIS — I499 Cardiac arrhythmia, unspecified: Secondary | ICD-10-CM | POA: Diagnosis not present

## 2021-05-22 DIAGNOSIS — M25512 Pain in left shoulder: Secondary | ICD-10-CM | POA: Diagnosis not present

## 2021-05-22 DIAGNOSIS — M25511 Pain in right shoulder: Secondary | ICD-10-CM | POA: Diagnosis not present

## 2021-05-22 DIAGNOSIS — E079 Disorder of thyroid, unspecified: Secondary | ICD-10-CM | POA: Diagnosis not present

## 2021-05-22 DIAGNOSIS — R404 Transient alteration of awareness: Secondary | ICD-10-CM | POA: Diagnosis not present

## 2021-05-22 DIAGNOSIS — S199XXA Unspecified injury of neck, initial encounter: Secondary | ICD-10-CM | POA: Diagnosis not present

## 2021-05-22 DIAGNOSIS — R410 Disorientation, unspecified: Secondary | ICD-10-CM | POA: Diagnosis not present

## 2021-05-22 DIAGNOSIS — S0093XA Contusion of unspecified part of head, initial encounter: Secondary | ICD-10-CM | POA: Diagnosis not present

## 2021-05-22 DIAGNOSIS — S0083XA Contusion of other part of head, initial encounter: Secondary | ICD-10-CM | POA: Diagnosis not present

## 2021-05-22 DIAGNOSIS — Z743 Need for continuous supervision: Secondary | ICD-10-CM | POA: Diagnosis not present

## 2021-05-22 DIAGNOSIS — Z8669 Personal history of other diseases of the nervous system and sense organs: Secondary | ICD-10-CM | POA: Diagnosis not present

## 2021-05-22 DIAGNOSIS — Z79899 Other long term (current) drug therapy: Secondary | ICD-10-CM | POA: Diagnosis not present

## 2021-05-22 DIAGNOSIS — R0602 Shortness of breath: Secondary | ICD-10-CM | POA: Diagnosis not present

## 2021-05-22 DIAGNOSIS — G8911 Acute pain due to trauma: Secondary | ICD-10-CM | POA: Diagnosis not present

## 2021-05-22 DIAGNOSIS — M797 Fibromyalgia: Secondary | ICD-10-CM | POA: Diagnosis not present

## 2021-05-22 DIAGNOSIS — Z91018 Allergy to other foods: Secondary | ICD-10-CM | POA: Diagnosis not present

## 2021-05-22 DIAGNOSIS — R55 Syncope and collapse: Secondary | ICD-10-CM | POA: Diagnosis not present

## 2021-05-22 DIAGNOSIS — R29818 Other symptoms and signs involving the nervous system: Secondary | ICD-10-CM | POA: Diagnosis not present

## 2021-05-30 DIAGNOSIS — K5900 Constipation, unspecified: Secondary | ICD-10-CM | POA: Diagnosis not present

## 2021-05-30 DIAGNOSIS — R9389 Abnormal findings on diagnostic imaging of other specified body structures: Secondary | ICD-10-CM | POA: Diagnosis not present

## 2021-05-30 DIAGNOSIS — R1013 Epigastric pain: Secondary | ICD-10-CM | POA: Diagnosis not present

## 2021-05-31 ENCOUNTER — Encounter: Payer: Self-pay | Admitting: Physician Assistant

## 2021-05-31 ENCOUNTER — Ambulatory Visit (INDEPENDENT_AMBULATORY_CARE_PROVIDER_SITE_OTHER): Payer: Medicare Other | Admitting: Physician Assistant

## 2021-05-31 ENCOUNTER — Other Ambulatory Visit: Payer: Self-pay

## 2021-05-31 VITALS — BP 129/57 | HR 53 | Ht 60.0 in | Wt 111.0 lb

## 2021-05-31 DIAGNOSIS — Z1211 Encounter for screening for malignant neoplasm of colon: Secondary | ICD-10-CM | POA: Diagnosis not present

## 2021-05-31 DIAGNOSIS — R55 Syncope and collapse: Secondary | ICD-10-CM | POA: Diagnosis not present

## 2021-05-31 DIAGNOSIS — K2900 Acute gastritis without bleeding: Secondary | ICD-10-CM

## 2021-05-31 NOTE — Progress Notes (Signed)
Subjective:    Patient ID: Judy Russell, female    DOB: [birth year], 65 y.o.   MRN: [id number]  HPI Pt is a 65 yo female with hypothyroidism, hx of seizures, bipolar who presents to the clinic to follow up after ED visits.   She was first seen on 11/18 for abdominal pain and after CT scan done treated for gastritis, constipation and gallstones. She was given zofran, omeprazole and carafate. She did not tolerate these medications and the worst was carafate. She is not taking these medications. She had follow up with GI provider and has endoscopy scheduled for this Thursday. For constipation she is taking miralax.   Her stomach was so upset and vomiting she did not take lamictal for 3 days. She woke up in the middle of the night and felt "dizzy". She called for her husband and then LOC and hit her head. She went back to ED on 11/20 and CT of head and spine done. All were normal. Hx of seizures but last one was 22 years ago. She does not have neurologist. She did not urinate on herself, bite her tongue, shake or have any indication of seizure. She is back on her lamictal.   She is feeling better. Gastritis seems to be improving.   .. 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   Left leg pain 03/18/2017   Right foot pain 03/18/2017   History of colon resection 07/09/2017   History of irregular heartbeat 07/09/2017   Sinus bradycardia by electrocardiogram 07/09/2017   Hyperlipidemia 07/11/2017   Elevated blood pressure reading 07/11/2017   Memory changes  07/19/2017   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   Perennial allergic rhinitis 08/13/2017   CFS (chronic fatigue syndrome) 11/27/2017   Multiple food allergies 03/25/2018   Environmental allergies 03/25/2018   Callus of foot 07/29/2018   Tonsillith 07/30/2018   DDD (degenerative disc disease), cervical 08/21/2018   Cervical radiculopathy 08/21/2018   Chronic constipation 10/30/2018   Food allergy 01/29/2019   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   Seborrheic keratoses 05/14/2019   Suspected COVID-19 virus infection 09/11/2019   COVID-19 virus infection 09/22/2019   History of COVID-19 10/07/2019   Mold exposure 12/24/2019   Chest tightness 12/24/2019   Allergic reaction 01/09/2020   Scalp itch 01/12/2020   Unintended weight loss 02/18/2020   Loss of taste 02/18/2020   Tapeworm 04/02/2020   Toenail fungus 09/14/2020   Corn of toe 09/14/2020   Rectocele 12/06/2020   No energy 02/28/2021   Lack of motivation 02/28/2021   Bipolar 1 disorder, mixed (HCC) 03/21/2021   Hamstring strain, left, initial encounter 04/28/2021   Resolved Ambulatory Problems    Diagnosis Date Noted   URI 09/04/2009   Acute bronchitis 05/03/2016   Essential hypertension 07/19/2017   Past Medical History:  Diagnosis Date   Allergy    HSV-2 (herpes simplex virus 2) infection    Seizures (HCC)    Thyroid disease      Review of Systems See HPI.     Objective:   Physical Exam Vitals reviewed.  Constitutional:  Appearance: Normal appearance.     Comments: Right tender mass of parietal area.   Cardiovascular:     Rate and Rhythm: Bradycardia present.     Pulses: Normal pulses.  Pulmonary:     Effort: Pulmonary effort is normal.  Neurological:     General: No focal deficit present.     Mental Status: She is alert and oriented to person, place, and time.  Psychiatric:        Mood and Affect: Mood normal.          Assessment & Plan:  Marland KitchenMarland KitchenLoray was seen today for hospitalization  follow-up.  Diagnoses and all orders for this visit:  Acute gastritis without hemorrhage, unspecified gastritis type  Colon cancer screening -     Cologuard  Declines colonoscopy with GI. Ordered cologuard.  Endoscopy on Thursday for gastritis.  Unclear if LOC was weakness/dizziness/seizure. Regain strength and ok to drive. Continue on lamictal. Follow up as needed or if having any new symptoms.

## 2021-05-31 NOTE — Patient Instructions (Addendum)
Ok to drive once strength is back and after endoscopy. If any more episodes then no driving until evaluated by neurology.   Bland Diet A bland diet consists of foods that are often soft and do not have a lot of fat, fiber, or extra seasonings. Foods without fat, fiber, or seasoning are easier for the body to digest. They are also less likely to irritate your mouth, throat, stomach, and other parts of your digestive system. A bland diet is sometimes called a BRAT diet. What is my plan? Your health care provider or food and nutrition specialist (dietitian) may recommend specific changes to your diet to prevent symptoms or to treat your symptoms. These changes may include: Eating small meals often. Cooking food until it is soft enough to chew easily. Chewing your food well. Drinking fluids slowly. Not eating foods that are very spicy, sour, or fatty. Not eating citrus fruits, such as oranges and grapefruit. What do I need to know about this diet? Eat a variety of foods from the bland diet food list. Do not follow a bland diet longer than needed. Ask your health care provider whether you should take vitamins or supplements. What foods can I eat? Grains Hot cereals, such as cream of wheat. Rice. Bread, crackers, or tortillas made from refined white flour. Vegetables Canned or cooked vegetables. Mashed or boiled potatoes. Fruits Bananas. Applesauce. Other types of cooked or canned fruit with the skin and seeds removed, such as canned peaches or pears. Meats and other proteins Scrambled eggs. Creamy peanut butter or other nut butters. Lean, well-cooked meats, such as chicken or fish. Tofu. Soups or broths. Dairy Low-fat dairy products, such as milk, cottage cheese, or yogurt. Beverages Water. Herbal tea. Apple juice. Fats and oils Mild salad dressings. Canola or olive oil. Sweets and desserts Pudding. Custard. Fruit gelatin. Ice cream. The items listed above may not be a complete list of  recommended foods and beverages. Contact a dietitian for more options. What foods are not recommended? Grains Whole grain breads and cereals. Vegetables Raw vegetables. Fruits Raw fruits, especially citrus, berries, or dried fruits. Dairy Whole fat dairy foods. Beverages Caffeinated drinks. Alcohol. Seasonings and condiments Strongly flavored seasonings or condiments. Hot sauce. Salsa. Other foods Spicy foods. Fried foods. Sour foods, such as pickled or fermented foods. Foods with high sugar content. Foods high in fiber. The items listed above may not be a complete list of foods and beverages to avoid. Contact a dietitian for more information. Summary A bland diet consists of foods that are often soft and do not have a lot of fat, fiber, or extra seasonings. Foods without fat, fiber, or seasoning are easier for the body to digest. Check with your health care provider to see how long you should follow this diet plan. It is not meant to be followed for long periods. This information is not intended to replace advice given to you by your health care provider. Make sure you discuss any questions you have with your health care provider. Document Revised: 07/18/2017 Document Reviewed: 07/18/2017 Elsevier Patient Education  2022 Elsevier Inc. Gastritis, Adult Gastritis is inflammation of the stomach. There are two kinds of gastritis: Acute gastritis. This kind develops suddenly. Chronic gastritis. This kind is much more common. It develops slowly and lasts for a long time. Gastritis happens when the lining of the stomach becomes weak or gets damaged. Without treatment, gastritis can lead to stomach bleeding and ulcers. What are the causes? This condition may be caused by:  An infection. Drinking too much alcohol. Certain medicines. These include steroids, antibiotics, and some over-the-counter medicines, such as aspirin or ibuprofen. Having too much acid in the stomach. Having a disease  of the stomach. Other causes may include: An allergic reaction. Some cancer treatments (radiation). Smoking cigarettes or the use of products that contain nicotine or tobacco. In some cases, the cause of this condition is not known. What increases the risk? Having a disease of the intestines. Having a disease in which the body's immune system attacks the body (autoimmune disease), such as Crohn's disease. Using aspirin or ibuprofen and other NSAIDs to treat other conditions, such as heart disease or chronic pain. Stress. What are the signs or symptoms? Symptoms of this condition include: Pain or a burning sensation in the upper abdomen. Nausea. Vomiting. An uncomfortable feeling of fullness after eating. Weight loss. Bad breath. Blood in your vomit or stool (feces). In some cases, there are no symptoms. How is this diagnosed? This condition may be diagnosed based on your medical history, a physical exam, and tests. Tests may include: Your medical history and a description of your symptoms. A physical exam. Tests. These can include: Blood tests. Stool tests. A test in which a thin, flexible instrument with a light and a camera is passed down the esophagus and into the stomach (upper endoscopy). A test in which a tissue sample is removed to look at it under a microscope (biopsy). How is this treated? This condition may be treated with medicines. The medicines that are used vary depending on the cause of the gastritis. If the condition is caused by a bacterial infection, you may be given antibiotic medicines. If the condition is caused by too much acid in the stomach, you may be given medicines called H2 blockers, proton pump inhibitors, or antacids. Treatment may also involve stopping the use of certain medicines such as aspirin or ibuprofen and other NSAIDs. Follow these instructions at home: Medicines Take over-the-counter and prescription medicines only as told by your health  care provider. If you were prescribed an antibiotic medicine, take it as told by your health care provider. Do not stop taking the antibiotic even if you start to feel better. Alcohol use Do not drink alcohol if: Your health care provider tells you not to drink. You are pregnant, may be pregnant, or are planning to become pregnant. If you drink alcohol: Limit your use to: 0-1 drink a day for women. 0-2 drinks a day for men. Know how much alcohol is in your drink. In the [place] one drink equals one 12 oz bottle of beer (355 mL), one 5 oz glass of wine (148 mL), or one 1 oz glass of hard liquor (44 mL). General instructions  Eat small, frequent meals instead of large meals. Avoid foods and drinks that make your symptoms worse. Talk with your health care provider about ways to manage stress, such as getting regular exercise or practicing deep breathing, meditation, or yoga. Do not use any products that contain nicotine or tobacco. These products include cigarettes, chewing tobacco, and vaping devices, such as e-cigarettes. If you need help quitting, ask your health care provider. Drink enough fluid to keep your urine pale yellow. Keep all follow-up visits. This is important. Contact a health care provider if: Your symptoms get worse. Your abdominal pain gets worse. Your symptoms return after treatment. You have a fever. Get help right away if: You vomit blood or a substance that looks like coffee grounds. You have black  or dark red stools. You are unable to keep fluids down. These symptoms may represent a serious problem that is an emergency. Do not wait to see if the symptoms will go away. Get medical help right away. Call your local emergency services (911 in the [place] Do not drive yourself to the hospital. Summary Gastritis is inflammation of the lining of the stomach that can occur suddenly (acute) or develop slowly over time (chronic). This condition is diagnosed with a medical  history, a physical exam, or tests. This condition may be treated with medicines to treat infection or medicines to reduce the amount of acid in your stomach. Follow your health care provider's instructions about taking medicines, making changes to your diet, and knowing when to call for help. This information is not intended to replace advice given to you by your health care provider. Make sure you discuss any questions you have with your health care provider. Document Revised: 10/23/2020 Document Reviewed: 10/23/2020 Elsevier Patient Education  [place].

## 2021-06-02 DIAGNOSIS — K3189 Other diseases of stomach and duodenum: Secondary | ICD-10-CM | POA: Diagnosis not present

## 2021-06-02 DIAGNOSIS — K2950 Unspecified chronic gastritis without bleeding: Secondary | ICD-10-CM | POA: Diagnosis not present

## 2021-06-02 DIAGNOSIS — R933 Abnormal findings on diagnostic imaging of other parts of digestive tract: Secondary | ICD-10-CM | POA: Diagnosis not present

## 2021-06-02 DIAGNOSIS — K259 Gastric ulcer, unspecified as acute or chronic, without hemorrhage or perforation: Secondary | ICD-10-CM | POA: Diagnosis not present

## 2021-06-02 DIAGNOSIS — R1013 Epigastric pain: Secondary | ICD-10-CM | POA: Diagnosis not present

## 2021-06-06 DIAGNOSIS — Z1211 Encounter for screening for malignant neoplasm of colon: Secondary | ICD-10-CM | POA: Diagnosis not present

## 2021-06-07 MED ORDER — ONDANSETRON 8 MG PO TBDP: 8.0000 mg | ORAL_TABLET | Freq: Three times a day (TID) | ORAL | 0 refills | Status: DC | PRN

## 2021-06-09 LAB — COLOGUARD: COLOGUARD: NEGATIVE

## 2021-06-10 NOTE — Progress Notes (Signed)
GRE[facility] news cologuard is negative. Recheck in 3 years.

## 2021-06-14 MED ORDER — DICLOFENAC SODIUM 1 % EX GEL: 4.0000 g | Freq: Four times a day (QID) | CUTANEOUS | 1 refills | Status: DC

## 2021-06-14 NOTE — Addendum Note (Signed)
Addended by: Jomarie Longs on: 06/14/2021 04:26 PM   Modules accepted: Orders

## 2021-07-05 ENCOUNTER — Encounter: Payer: Self-pay | Admitting: Physician Assistant

## 2021-07-11 DIAGNOSIS — M8000XS Age-related osteoporosis with current pathological fracture, unspecified site, sequela: Secondary | ICD-10-CM | POA: Diagnosis not present

## 2021-07-11 DIAGNOSIS — E039 Hypothyroidism, unspecified: Secondary | ICD-10-CM | POA: Diagnosis not present

## 2021-07-11 DIAGNOSIS — M154 Erosive (osteo)arthritis: Secondary | ICD-10-CM | POA: Diagnosis not present

## 2021-07-11 DIAGNOSIS — M19042 Primary osteoarthritis, left hand: Secondary | ICD-10-CM | POA: Diagnosis not present

## 2021-07-11 DIAGNOSIS — M797 Fibromyalgia: Secondary | ICD-10-CM | POA: Diagnosis not present

## 2021-07-11 DIAGNOSIS — M19041 Primary osteoarthritis, right hand: Secondary | ICD-10-CM | POA: Diagnosis not present

## 2021-07-25 ENCOUNTER — Ambulatory Visit (INDEPENDENT_AMBULATORY_CARE_PROVIDER_SITE_OTHER): Payer: Medicare Other

## 2021-07-25 ENCOUNTER — Ambulatory Visit (INDEPENDENT_AMBULATORY_CARE_PROVIDER_SITE_OTHER): Payer: Medicare Other | Admitting: Physician Assistant

## 2021-07-25 ENCOUNTER — Other Ambulatory Visit: Payer: Self-pay

## 2021-07-25 VITALS — BP 147/74 | HR 55 | Ht 60.0 in | Wt 119.0 lb

## 2021-07-25 DIAGNOSIS — M5030 Other cervical disc degeneration, unspecified cervical region: Secondary | ICD-10-CM

## 2021-07-25 DIAGNOSIS — M159 Polyosteoarthritis, unspecified: Secondary | ICD-10-CM

## 2021-07-25 DIAGNOSIS — M545 Low back pain, unspecified: Secondary | ICD-10-CM | POA: Diagnosis not present

## 2021-07-25 DIAGNOSIS — W19XXXA Unspecified fall, initial encounter: Secondary | ICD-10-CM | POA: Diagnosis not present

## 2021-07-25 DIAGNOSIS — M5136 Other intervertebral disc degeneration, lumbar region: Secondary | ICD-10-CM | POA: Diagnosis not present

## 2021-07-25 DIAGNOSIS — M542 Cervicalgia: Secondary | ICD-10-CM

## 2021-07-25 DIAGNOSIS — G8929 Other chronic pain: Secondary | ICD-10-CM

## 2021-07-25 DIAGNOSIS — M2578 Osteophyte, vertebrae: Secondary | ICD-10-CM | POA: Diagnosis not present

## 2021-07-25 DIAGNOSIS — Z043 Encounter for examination and observation following other accident: Secondary | ICD-10-CM | POA: Diagnosis not present

## 2021-07-25 NOTE — Progress Notes (Signed)
Subjective:    Patient ID: Judy Russell, female    DOB: 07/30/1955, 66 y.o.   MRN: [id number]  HPI Pt is a 66 yo female who presents to the clinic after a fall and continued neck, mid back and low back pain. Pt has a history of degenerative disc disease, spurring, arthritis. She fell last Monday on her right side on concrete at her house while trying to get her cat to go back upstairs. She did not hit her head. She has been sore since. She has had neck, mid back, low back pain, tightness, soreness. Denies any numbness/tingling/pain radiating down legs. No strength changes. Cannot take NSAIDs. Using tylenol which does help some. She needs xrays to go to chiropractor.   .. 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   Left leg pain 03/18/2017   Right foot pain 03/18/2017   History of colon resection 07/09/2017   History of irregular heartbeat 07/09/2017   Sinus bradycardia by electrocardiogram 07/09/2017   Hyperlipidemia 07/11/2017   Elevated blood pressure reading 07/11/2017   Memory changes 07/19/2017   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   Perennial allergic rhinitis 08/13/2017   CFS (chronic fatigue syndrome) 11/27/2017   Multiple food allergies 03/25/2018   Environmental allergies 03/25/2018   Callus of foot 07/29/2018   Tonsillith 07/30/2018   DDD (degenerative disc disease), cervical 08/21/2018   Cervical radiculopathy 08/21/2018   Chronic constipation 10/30/2018   Food allergy 01/29/2019   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   Seborrheic keratoses  05/14/2019   Suspected COVID-19 virus infection 09/11/2019   COVID-19 virus infection 09/22/2019   History of COVID-19 10/07/2019   Mold exposure 12/24/2019   Chest tightness 12/24/2019   Allergic reaction 01/09/2020   Scalp itch 01/12/2020   Unintended weight loss 02/18/2020   Loss of taste 02/18/2020   Tapeworm 04/02/2020   Toenail fungus 09/14/2020   Corn of toe 09/14/2020   Rectocele 12/06/2020   No energy 02/28/2021   Lack of motivation 02/28/2021   Bipolar 1 disorder, mixed (HCC) 03/21/2021   Hamstring strain, left, initial encounter 04/28/2021   Syncope 05/31/2021   Acute gastritis without hemorrhage 05/31/2021   Acute bilateral low back pain without sciatica 07/25/2021   Fall 07/26/2021   Resolved Ambulatory Problems    Diagnosis Date Noted   URI 09/04/2009   Acute bronchitis 05/03/2016   Essential hypertension 07/19/2017   Past Medical History:  Diagnosis Date   Allergy    HSV-2 (herpes simplex virus 2) infection    Seizures (HCC)    Thyroid disease      Review of Systems See HPI.     Objective:   Physical Exam Vitals reviewed.  Constitutional:      Appearance: Normal appearance.  HENT:     Head: Normocephalic.  Cardiovascular:     Rate and Rhythm: Normal rate and regular rhythm.     Pulses: Normal pulses.  Pulmonary:     Effort: Pulmonary effort is normal.     Breath sounds: Normal breath sounds.  Musculoskeletal:     Right lower leg: No edema.  Left lower leg: No edema.     Comments: No tenderness over cervical, thoracic, lumbar spine to palpation.  NROM at waist. Tight paraspinal muscles and some tenderness over certain spots.  Negative SLR.  Normal strength of upper and lower extm Normal ROM of head  Neurological:     General: No focal deficit present.     Mental Status: She is alert and oriented to person, place, and time.  Psychiatric:        Mood and Affect: Mood normal.          Assessment & Plan:  Marland KitchenMarland KitchenToia was seen today  for fall.  Diagnoses and all orders for this visit:  Fall, initial encounter -     DG Cervical Spine Complete; Future -     DG Thoracic Spine W/Swimmers; Future -     DG Lumbar Spine Complete; Future  DDD (degenerative disc disease), cervical -     DG Cervical Spine Complete; Future -     DG Thoracic Spine W/Swimmers; Future -     DG Lumbar Spine Complete; Future  Primary osteoarthritis involving multiple joints -     DG Cervical Spine Complete; Future -     DG Thoracic Spine W/Swimmers; Future -     DG Lumbar Spine Complete; Future  Chronic neck pain -     DG Cervical Spine Complete; Future -     DG Thoracic Spine W/Swimmers; Future -     DG Lumbar Spine Complete; Future  Acute bilateral low back pain without sciatica -     DG Cervical Spine Complete; Future -     DG Thoracic Spine W/Swimmers; Future -     DG Lumbar Spine Complete; Future   No red flag signs or symptoms.  Likely most of pain is muscular soreness Will get xrays for chiropractor and to look for any fractures Declines toradol, muscle relaxers or pain rx.  Continue tylenol. Consider turmeric for inflammation.  Discussed heat, ice, icy hot, tens units, stretches, massages. Consider PT if chiropractic care not helping Discussed could take a few weeks to start feeling better.

## 2021-07-25 NOTE — Patient Instructions (Signed)
Low Back Sprain or Strain Rehab Ask your health care provider which exercises are safe for you. Do exercises exactly as told by your health care provider and adjust them as directed. It is normal to feel mild stretching, pulling, tightness, or discomfort as you do these exercises. Stop right away if you feel sudden pain or your pain gets worse. Do not begin these exercises until told by your health care provider. Stretching and range-of-motion exercises These exercises warm up your muscles and joints and improve the movement and flexibility of your back. These exercises also help to relieve pain, numbness, and tingling. Lumbar rotation  Lie on your back on a firm bed or the floor with your knees bent. Straighten your arms out to your sides so each arm forms a 90-degree angle (right angle) with a side of your body. Slowly move (rotate) both of your knees to one side of your body until you feel a stretch in your lower back (lumbar). Try not to let your shoulders lift off the floor. Hold this position for __________ seconds. Tense your abdominal muscles and slowly move your knees back to the starting position. Repeat this exercise on the other side of your body. Repeat __________ times. Complete this exercise __________ times a day. Single knee to chest  Lie on your back on a firm bed or the floor with both legs straight. Bend one of your knees. Use your hands to move your knee up toward your chest until you feel a gentle stretch in your lower back and buttock. Hold your leg in this position by holding on to the front of your knee. Keep your other leg as straight as possible. Hold this position for __________ seconds. Slowly return to the starting position. Repeat with your other leg. Repeat __________ times. Complete this exercise __________ times a day. Prone extension on elbows  Lie on your abdomen on a firm bed or the floor (prone position). Prop yourself up on your elbows. Use your arms  to help lift your chest up until you feel a gentle stretch in your abdomen and your lower back. This will place some of your body weight on your elbows. If this is uncomfortable, try stacking pillows under your chest. Your hips should stay down, against the surface that you are lying on. Keep your hip and back muscles relaxed. Hold this position for __________ seconds. Slowly relax your upper body and return to the starting position. Repeat __________ times. Complete this exercise __________ times a day. Strengthening exercises These exercises build strength and endurance in your back. Endurance is the ability to use your muscles for a long time, even after they get tired. Pelvic tilt This exercise strengthens the muscles that lie deep in the abdomen. Lie on your back on a firm bed or the floor with your legs extended. Bend your knees so they are pointing toward the ceiling and your feet are flat on the floor. Tighten your lower abdominal muscles to press your lower back against the floor. This motion will tilt your pelvis so your tailbone points up toward the ceiling instead of pointing to your feet or the floor. To help with this exercise, you may place a small towel under your lower back and try to push your back into the towel. Hold this position for __________ seconds. Let your muscles relax completely before you repeat this exercise. Repeat __________ times. Complete this exercise __________ times a day. Alternating arm and leg raises  Get on your hands   and knees on a firm surface. If you are on a hard floor, you may want to use padding, such as an exercise mat, to cushion your knees. Line up your arms and legs. Your hands should be directly below your shoulders, and your knees should be directly below your hips. Lift your left leg behind you. At the same time, raise your right arm and straighten it in front of you. Do not lift your leg higher than your hip. Do not lift your arm higher  than your shoulder. Keep your abdominal and back muscles tight. Keep your hips facing the ground. Do not arch your back. Keep your balance carefully, and do not hold your breath. Hold this position for __________ seconds. Slowly return to the starting position. Repeat with your right leg and your left arm. Repeat __________ times. Complete this exercise __________ times a day. Abdominal set with straight leg raise  Lie on your back on a firm bed or the floor. Bend one of your knees and keep your other leg straight. Tense your abdominal muscles and lift your straight leg up, 4-6 inches (10-15 cm) off the ground. Keep your abdominal muscles tight and hold this position for __________ seconds. Do not hold your breath. Do not arch your back. Keep it flat against the ground. Keep your abdominal muscles tense as you slowly lower your leg back to the starting position. Repeat with your other leg. Repeat __________ times. Complete this exercise __________ times a day. Single leg lower with bent knees Lie on your back on a firm bed or the floor. Tense your abdominal muscles and lift your feet off the floor, one foot at a time, so your knees and hips are bent in 90-degree angles (right angles). Your knees should be over your hips and your lower legs should be parallel to the floor. Keeping your abdominal muscles tense and your knee bent, slowly lower one of your legs so your toe touches the ground. Lift your leg back up to return to the starting position. Do not hold your breath. Do not let your back arch. Keep your back flat against the ground. Repeat with your other leg. Repeat __________ times. Complete this exercise __________ times a day. Posture and body mechanics Good posture and healthy body mechanics can help to relieve stress in your body's tissues and joints. Body mechanics refers to the movements and positions of your body while you do your daily activities. Posture is part of body  mechanics. Good posture means: Your spine is in its natural S-curve position (neutral). Your shoulders are pulled back slightly. Your head is not tipped forward (neutral). Follow these guidelines to improve your posture and body mechanics in your everyday activities. Standing  When standing, keep your spine neutral and your feet about hip-width apart. Keep a slight bend in your knees. Your ears, shoulders, and hips should line up. When you do a task in which you stand in one place for a long time, place one foot up on a stable object that is 2-4 inches (5-10 cm) high, such as a footstool. This helps keep your spine neutral. Sitting  When sitting, keep your spine neutral and keep your feet flat on the floor. Use a footrest, if necessary, and keep your thighs parallel to the floor. Avoid rounding your shoulders, and avoid tilting your head forward. When working at a desk or a computer, keep your desk at a height where your hands are slightly lower than your elbows. Slide your   chair under your desk so you are close enough to maintain good posture. When working at a computer, place your monitor at a height where you are looking straight ahead and you do not have to tilt your head forward or downward to look at the screen. Resting When lying down and resting, avoid positions that are most painful for you. If you have pain with activities such as sitting, bending, stooping, or squatting, lie in a position in which your body does not bend very much. For example, avoid curling up on your side with your arms and knees near your chest (fetal position). If you have pain with activities such as standing for a long time or reaching with your arms, lie with your spine in a neutral position and bend your knees slightly. Try the following positions: Lying on your side with a pillow between your knees. Lying on your back with a pillow under your knees. Lifting  When lifting objects, keep your feet at least  shoulder-width apart and tighten your abdominal muscles. Bend your knees and hips and keep your spine neutral. It is important to lift using the strength of your legs, not your back. Do not lock your knees straight out. Always ask for help to lift heavy or awkward objects. This information is not intended to replace advice given to you by your health care provider. Make sure you discuss any questions you have with your health care provider. Document Revised: 09/06/2020 Document Reviewed: 09/06/2020 Elsevier Patient Education  2022 Elsevier Inc.  

## 2021-07-26 ENCOUNTER — Encounter: Payer: Self-pay | Admitting: Physician Assistant

## 2021-07-26 DIAGNOSIS — W19XXXA Unspecified fall, initial encounter: Secondary | ICD-10-CM | POA: Insufficient documentation

## 2021-07-27 ENCOUNTER — Encounter: Payer: Self-pay | Admitting: Physician Assistant

## 2021-07-27 DIAGNOSIS — E039 Hypothyroidism, unspecified: Secondary | ICD-10-CM

## 2021-07-27 DIAGNOSIS — M47819 Spondylosis without myelopathy or radiculopathy, site unspecified: Secondary | ICD-10-CM | POA: Insufficient documentation

## 2021-07-27 DIAGNOSIS — M4125 Other idiopathic scoliosis, thoracolumbar region: Secondary | ICD-10-CM | POA: Diagnosis not present

## 2021-07-27 DIAGNOSIS — M9901 Segmental and somatic dysfunction of cervical region: Secondary | ICD-10-CM | POA: Diagnosis not present

## 2021-07-27 DIAGNOSIS — M5442 Lumbago with sciatica, left side: Secondary | ICD-10-CM | POA: Diagnosis not present

## 2021-07-27 DIAGNOSIS — M9904 Segmental and somatic dysfunction of sacral region: Secondary | ICD-10-CM | POA: Diagnosis not present

## 2021-07-27 DIAGNOSIS — M5030 Other cervical disc degeneration, unspecified cervical region: Secondary | ICD-10-CM | POA: Diagnosis not present

## 2021-07-27 DIAGNOSIS — M9902 Segmental and somatic dysfunction of thoracic region: Secondary | ICD-10-CM | POA: Diagnosis not present

## 2021-07-27 DIAGNOSIS — M9903 Segmental and somatic dysfunction of lumbar region: Secondary | ICD-10-CM | POA: Diagnosis not present

## 2021-07-27 NOTE — Progress Notes (Signed)
Scoliosis noted with some degenerative changes. No acute findings.

## 2021-07-27 NOTE — Progress Notes (Signed)
Scoliosis with degenerative changes. No acute changes.

## 2021-07-27 NOTE — Progress Notes (Signed)
Your neck appears to have the most degenerative changes with some severe disc space narrowing at C4/C5 and C6/C7.   Would you like to discuss a plan for degenerative changes with Dr. Karie Schwalbe ortho is his speciality.

## 2021-08-02 ENCOUNTER — Other Ambulatory Visit: Payer: Self-pay

## 2021-08-02 ENCOUNTER — Ambulatory Visit (INDEPENDENT_AMBULATORY_CARE_PROVIDER_SITE_OTHER): Payer: Medicare Other | Admitting: Sports Medicine

## 2021-08-02 DIAGNOSIS — K2900 Acute gastritis without bleeding: Secondary | ICD-10-CM

## 2021-08-02 DIAGNOSIS — M47819 Spondylosis without myelopathy or radiculopathy, site unspecified: Secondary | ICD-10-CM

## 2021-08-02 MED ORDER — PREDNISONE 50 MG PO TABS: ORAL_TABLET | ORAL | 0 refills | Status: DC

## 2021-08-02 NOTE — Progress Notes (Signed)
° ° °  Procedures performed today:    None.  Independent interpretation of notes and tests performed by another provider:   None.  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 History of peptic ulcer disease so unable to use NSAIDs. Pain is fairly severe, no red flag symptoms. We will do a 5-day burst of prednisone and double her acid blocker, aggressive formal physical therapy. Return to see me in 4 weeks, if insufficient improvement we will consider Lyrica. Ultimately if Lyrica fails we will discuss MRI, she is gravely claustrophobic so she would likely need a high dose of triazolam prior.    ___________________________________________ Ihor Austin. Benjamin Stain, M.D., ABFM., CAQSM. Primary Care and Sports Medicine [place] [facility]  Adjunct Instructor of Family Medicine  [employer]

## 2021-08-02 NOTE — Assessment & Plan Note (Signed)
This is a pleasant 66 year old female, she is a long history of cervical, thoracic, lumbar back pain, worse in bed at night, she is also got x-rays that show multilevel cervical, thoracic, and lumbar DDD. History of peptic ulcer disease so unable to use NSAIDs. Pain is fairly severe, no red flag symptoms. We will do a 5-day burst of prednisone and double her acid blocker, aggressive formal physical therapy. Return to see me in 4 weeks, if insufficient improvement we will consider Lyrica. Ultimately if Lyrica fails we will discuss MRI, she is gravely claustrophobic so she would likely need a high dose of triazolam prior.

## 2021-08-03 DIAGNOSIS — M9903 Segmental and somatic dysfunction of lumbar region: Secondary | ICD-10-CM | POA: Diagnosis not present

## 2021-08-03 DIAGNOSIS — M9904 Segmental and somatic dysfunction of sacral region: Secondary | ICD-10-CM | POA: Diagnosis not present

## 2021-08-03 DIAGNOSIS — M5442 Lumbago with sciatica, left side: Secondary | ICD-10-CM | POA: Diagnosis not present

## 2021-08-03 DIAGNOSIS — M4125 Other idiopathic scoliosis, thoracolumbar region: Secondary | ICD-10-CM | POA: Diagnosis not present

## 2021-08-03 DIAGNOSIS — M9901 Segmental and somatic dysfunction of cervical region: Secondary | ICD-10-CM | POA: Diagnosis not present

## 2021-08-08 ENCOUNTER — Encounter: Payer: Self-pay | Admitting: Physical Therapy

## 2021-08-08 ENCOUNTER — Ambulatory Visit: Payer: Medicare Other | Attending: Sports Medicine | Admitting: Physical Therapy

## 2021-08-08 ENCOUNTER — Other Ambulatory Visit: Payer: Self-pay

## 2021-08-08 DIAGNOSIS — R252 Cramp and spasm: Secondary | ICD-10-CM

## 2021-08-08 DIAGNOSIS — W19XXXD Unspecified fall, subsequent encounter: Secondary | ICD-10-CM | POA: Insufficient documentation

## 2021-08-08 DIAGNOSIS — R2689 Other abnormalities of gait and mobility: Secondary | ICD-10-CM

## 2021-08-08 DIAGNOSIS — M25552 Pain in left hip: Secondary | ICD-10-CM

## 2021-08-08 DIAGNOSIS — M6281 Muscle weakness (generalized): Secondary | ICD-10-CM | POA: Diagnosis not present

## 2021-08-08 DIAGNOSIS — M545 Low back pain, unspecified: Secondary | ICD-10-CM | POA: Insufficient documentation

## 2021-08-08 DIAGNOSIS — M25652 Stiffness of left hip, not elsewhere classified: Secondary | ICD-10-CM

## 2021-08-08 DIAGNOSIS — M47819 Spondylosis without myelopathy or radiculopathy, site unspecified: Secondary | ICD-10-CM | POA: Insufficient documentation

## 2021-08-08 NOTE — Therapy (Signed)
[facility] [address] [place], [place], [address] Phone: [phone]   Fax:  [phone]  Physical Therapy Evaluation  Patient Details  Name: Daleen BoVivian Holt MRN: [id number] Date of Birth: 03/03/1956 Referring Provider (PT): Monica Bectonhekkekandam, Thomas J, MD   Encounter Date: 08/08/2021   PT End of Session - 08/08/21 1101     Visit Number 1    Number of Visits 12    Date for PT Re-Evaluation 09/19/21    Authorization Type UHC Medicare    PT Start Time 1100    PT Stop Time 1145    PT Time Calculation (min) 45 min    Activity Tolerance Patient tolerated treatment well    Behavior During Therapy WFL for tasks assessed/performed             Past Medical History:  Diagnosis Date   Allergy    Fibromyalgia    HSV-2 (herpes simplex virus 2) infection    Seizures (HCC)    Thyroid disease     Past Surgical History:  Procedure Laterality Date   APPENDECTOMY     DILATION AND CURETTAGE OF UTERUS     OOPHORECTOMY Left     There were no vitals filed for this visit.    Subjective Assessment - 08/08/21 1106     Subjective Pt reports getting very sick in Nov 2022. Pt notes falling and hurting her head and back then and then another fall in the basement while trying to get her cat. Pt reports her arthritis throughout her spine is getting aggravated as well after the fall. Pt states she has been getting "locked up" and unable to get out of bed (this has happened at least twice). Pt has been seeing chiropractor x 2 weeks and feels like he's loosening her (has been manipulating and doing manual work). Pt states she has exercises she does and was running prior to her falls. Pt states she's okay as she loosens up throughout the day (early morning she's too stiff)    Limitations Walking;House hold activities;Sitting    How long can you sit comfortably? Has to get up and walk around    How long can you stand comfortably? During the day  she's okay    How long can you walk comfortably? ~1/2 mile    Patient Stated Goals Get back to running    Currently in Pain? 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 2    Has the patient had a decrease in activity level because of a fear of falling?  Yes    Is the patient reluctant to leave their home because of a fear of falling?  No      Home [employer] information centre manager residence      Prior Function   Vocation Retired    Leisure Running      Observation/Other Assessments   Focus on Therapeutic Outcomes (FOTO)  to be assessed next session      Sensation   Light Touch Appears Intact      ROM / Strength   AROM / PROM / Strength AROM;Strength      AROM   AROM Assessment Site Lumbar    Lumbar Flexion WFL    Lumbar Extension WFL    Lumbar - Right Side Bend ~3" from knee   Feels pull on L   Lumbar - Left Side Bend ~2" from knee   feels on L   Lumbar - Right Rotation 80%   feels pull on R   Lumbar - Left Rotation 80%   feels pull on L     Strength   Strength Assessment Site Hip    Right/Left Hip Right;Left    Right Hip Flexion 5/5     Right Hip Extension 4+/5    Right Hip External Rotation  5/5    Right Hip Internal Rotation 5/5    Right Hip ABduction 4/5    Left Hip Flexion 4/5   increased pain   Left Hip Extension 4-/5    Left Hip External Rotation 4+/5    Left Hip Internal Rotation 4+/5    Left Hip ABduction 3/5      Palpation   Palpation comment Taut with multiple trigger points in L glute and piriformis      Special Tests    Special Tests Lumbar    Lumbar Tests Prone Knee Bend Test;Straight Leg Raise;FABER test      FABER test   findings Positive    Side LEft      Prone Knee Bend Test   Findings Positive    Comment Feels in low/mid back bilat      Straight Leg Raise   Findings Negative    Comment L hamstring ~60 deg, R hamstring ~80 deg                        Objective measurements completed on examination: See above findings.                PT Education - 08/08/21 1153     Education Details Discussed exam findings, HEP and POC    Person(s) Educated Patient    Methods Explanation;Demonstration;Tactile cues;Verbal cues;Handout    Comprehension Verbalized understanding;Returned demonstration;Verbal cues required;Tactile cues required                 PT Long Term Goals - 08/08/21 1204       PT LONG TERM GOAL #1   Title Pt will be independent with her HEP    Time 6    Period Weeks    Status New    Target Date 09/19/21      PT LONG TERM GOAL #2   Title Pt will demo L = R hip ROM    Baseline L SLR ~60 deg, R SLR ~  80 deg    Time 6    Period Weeks    Status New    Target Date 09/19/21      PT LONG TERM GOAL #3   Title Pt will report at least 50% decrease in her pain    Time 6    Period Weeks    Status New    Target Date 09/19/21      PT LONG TERM GOAL #4   Title Pt will be able to return to running    Time 6    Period Weeks    Status New    Target Date 09/19/21      PT LONG TERM GOAL #5   Title Pt will improve FOTO score to predicted level     Time 6    Period Weeks    Status New    Target Date 09/19/21                    Plan - 08/08/21 1154     Clinical Impression Statement Ms. Otho Darner" Zigan is a 66 y/o F presenting to OPPT due to complaint of L low back and hip pain s/p fall and sickness in November 2022. Pt demonstrates L>R hip tightness and weakness with multiple trigger points in her glutes and piriformis affecting her comfort with sleeping on her new bed and returning to running. Pt is additionally seeing a chiropractor for her spine and thus wishes to keep PT at 1x/wk. Discussed TPDN but did not seem interested at this time. Pt would benefit from PT to address these issues to return her to her PLOF. Pt reports improved symptoms at end of session    Personal Factors and Comorbidities Age;Fitness;Time since onset of injury/illness/exacerbation    Examination-Activity Limitations Locomotion Level;Transfers;Bed Mobility;Stand    Examination-Participation Restrictions Community Activity    Stability/Clinical Decision Making Evolving/Moderate complexity    Clinical Decision Making Moderate    Rehab Potential Good    PT Frequency 1x / week    PT Duration 6 weeks    PT Treatment/Interventions ADLs/Self Care Home Management;Aquatic Therapy;Electrical Stimulation;Cryotherapy;Moist Heat;DME Instruction;Ultrasound;Iontophoresis 4mg /ml Dexamethasone;Gait training;Stair training;Functional mobility training;Therapeutic activities;Therapeutic exercise;Balance training;Neuromuscular re-education;Manual techniques;Patient/family education;Dry needling;Taping;Joint Manipulations;Spinal Manipulations;Passive range of motion    PT Next Visit Plan How was HEP? Assess FOTO. Continue hip stretching and strengthening. Initiate exercises in standing. Manual work as needed through [employer]. Discuss sleep positioning    PT Home Exercise Plan Access Code: [email or web address]    Consulted and Agree with Plan of Care Patient             Patient will  benefit from skilled therapeutic intervention in order to improve the following deficits and impairments:  Decreased range of motion, Increased fascial restricitons, Difficulty walking, Increased muscle spasms, Decreased activity tolerance, Pain, Hypomobility, Impaired flexibility, Decreased mobility, Decreased strength, Postural dysfunction  Visit Diagnosis: Cramp and spasm  Muscle weakness (generalized)  Other abnormalities of gait and mobility  Pain in left hip  Stiffness of left hip, not elsewhere classified     Problem List Patient Active Problem List   Diagnosis Date Noted   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 07/27/2021   Fall 07/26/2021   Acute bilateral low back pain without sciatica 07/25/2021   Syncope 05/31/2021   Acute gastritis without hemorrhage 05/31/2021   Hamstring strain, left, initial encounter 04/28/2021   Bipolar 1 disorder, mixed (HCC) 03/21/2021   No energy 02/28/2021   Lack of motivation 02/28/2021   Rectocele 12/06/2020  Toenail fungus 09/14/2020   Corn of toe 09/14/2020   Tapeworm 04/02/2020   Unintended weight loss 02/18/2020   Loss of taste 02/18/2020   Scalp itch 01/12/2020   Allergic reaction 01/09/2020   Mold exposure 12/24/2019   Chest tightness 12/24/2019   History of COVID-19 10/07/2019   COVID-19 virus infection 09/22/2019   Suspected COVID-19 virus infection 09/11/2019   Seborrheic keratoses 05/14/2019   Food allergy 01/29/2019   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   Chronic constipation 10/30/2018   Cervical radiculopathy 08/21/2018   Tonsillith 07/30/2018   Callus of foot 07/29/2018   Multiple food allergies 03/25/2018   Environmental allergies 03/25/2018   CFS (chronic fatigue syndrome) 11/27/2017   Perennial allergic rhinitis 08/13/2017   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   Memory changes 07/19/2017   Hyperlipidemia 07/11/2017   Elevated blood pressure reading 07/11/2017   History of colon resection 07/09/2017   History of  irregular heartbeat 07/09/2017   Sinus bradycardia by electrocardiogram 07/09/2017   Left leg pain 03/18/2017   Right foot pain 03/18/2017   Menopausal symptoms 07/21/2016   Mood disorder in conditions classified elsewhere 07/21/2016   Plantar wart of left foot 02/29/2016   Dyspareunia in female 07/07/2015   Herpes genitalis in women 07/07/2015   Fibromyalgia 05/05/2015   Primary osteoarthritis involving multiple joints 05/05/2015   Thyroid activity decreased 05/05/2015   Chronic neck pain 05/05/2015   Hypothyroidism 09/04/2009   SEIZURE DISORDER 09/04/2009    [facility] April Dell Ponto, PT, DPT 08/08/2021, 12:10 PM  [facility] [address] [place], [place], [address] Phone: [phone]   Fax:  [phone]  Name: Kip Kautzman MRN: [id number] Date of Birth: 1955-11-22

## 2021-08-10 DIAGNOSIS — M9901 Segmental and somatic dysfunction of cervical region: Secondary | ICD-10-CM | POA: Diagnosis not present

## 2021-08-10 DIAGNOSIS — M9903 Segmental and somatic dysfunction of lumbar region: Secondary | ICD-10-CM | POA: Diagnosis not present

## 2021-08-10 DIAGNOSIS — M4125 Other idiopathic scoliosis, thoracolumbar region: Secondary | ICD-10-CM | POA: Diagnosis not present

## 2021-08-10 DIAGNOSIS — M9904 Segmental and somatic dysfunction of sacral region: Secondary | ICD-10-CM | POA: Diagnosis not present

## 2021-08-10 DIAGNOSIS — M5442 Lumbago with sciatica, left side: Secondary | ICD-10-CM | POA: Diagnosis not present

## 2021-08-10 DIAGNOSIS — M9902 Segmental and somatic dysfunction of thoracic region: Secondary | ICD-10-CM | POA: Diagnosis not present

## 2021-08-10 DIAGNOSIS — M5030 Other cervical disc degeneration, unspecified cervical region: Secondary | ICD-10-CM | POA: Diagnosis not present

## 2021-08-15 ENCOUNTER — Ambulatory Visit: Payer: Medicare Other | Admitting: Physical Therapy

## 2021-08-15 ENCOUNTER — Other Ambulatory Visit: Payer: Self-pay

## 2021-08-15 DIAGNOSIS — M545 Low back pain, unspecified: Secondary | ICD-10-CM | POA: Diagnosis not present

## 2021-08-15 DIAGNOSIS — R252 Cramp and spasm: Secondary | ICD-10-CM

## 2021-08-15 DIAGNOSIS — M47819 Spondylosis without myelopathy or radiculopathy, site unspecified: Secondary | ICD-10-CM | POA: Diagnosis not present

## 2021-08-15 DIAGNOSIS — M25652 Stiffness of left hip, not elsewhere classified: Secondary | ICD-10-CM | POA: Diagnosis not present

## 2021-08-15 DIAGNOSIS — R2689 Other abnormalities of gait and mobility: Secondary | ICD-10-CM

## 2021-08-15 DIAGNOSIS — M25552 Pain in left hip: Secondary | ICD-10-CM

## 2021-08-15 DIAGNOSIS — M6281 Muscle weakness (generalized): Secondary | ICD-10-CM | POA: Diagnosis not present

## 2021-08-15 NOTE — Therapy (Signed)
[facility] [place] [address] [place], [place], [address] Phone: [phone]   Fax:  [phone]  Physical Therapy Treatment  Patient Details  Name: Judy Russell MRN: [id number] Date of Birth: 02-Dec-1955 Referring Provider (PT): Monica Becton, MD   Encounter Date: 08/15/2021   PT End of Session - 08/15/21 1525     Visit Number 2    Number of Visits 12    Date for PT Re-Evaluation 09/19/21    Authorization Type UHC Medicare    PT Start Time 1525    PT Stop Time 1605    PT Time Calculation (min) 40 min    Activity Tolerance Patient tolerated treatment well    Behavior During Therapy WFL for tasks assessed/performed             Past Medical History:  Diagnosis Date   Allergy    Fibromyalgia    HSV-2 (herpes simplex virus 2) infection    Seizures (HCC)    Thyroid disease     Past Surgical History:  Procedure Laterality Date   APPENDECTOMY     DILATION AND CURETTAGE OF UTERUS     OOPHORECTOMY Left     There were no vitals filed for this visit.   Subjective Assessment - 08/15/21 1527     Subjective Pt states chiropractor put her on inversion table. Pt reports at night she gets locked up. Pt states it's not all the time but most of the time. Pt tried to sleep on the bed but afraid of getting locked up. Pt states she was able to walk 2 miles but it was uncomfortable. Has been using deep massager. Pt states she has to roll to get off floor.    Limitations Walking;House hold activities;Sitting    How long can you sit comfortably? Has to get up and walk around    How long can you stand comfortably? During the day she's okay    How long can you walk comfortably? ~1/2 mile    Patient Stated Goals Get back to running    Currently in Pain? 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     Referring Provider (PT) Monica Becton, MD    Prior Therapy Has chiropractor she sees regularly; has seen PT a long time ago                           [facility] Adult PT Treatment/Exercise - 08/15/21 0001       Exercises   Exercises Knee/Hip      Knee/Hip Exercises: Stretches   Passive Hamstring Stretch Right;Left;30 seconds    Hip Flexor Stretch Right;Left;30 seconds    Hip Flexor Stretch Limitations prone    ITB Stretch Right;Left;30 seconds    Other Knee/Hip Stretches Figure 4 stretch x30 sec      Knee/Hip Exercises: Aerobic   Tread Mill 2 mph x 5 min      Knee/Hip Exercises: Sidelying   Clams 2x10   tightening up of L glute again while laying on L side   Other Sidelying Knee/Hip Exercises reverse clam 2x10      Knee/Hip Exercises: Prone   Hip Extension Strengthening;Right;Left;2 sets;10 reps      Manual Therapy   Manual Therapy  Soft tissue mobilization;Joint mobilization    Joint Mobilization Sacral PAs R, L and center grade III    Soft tissue mobilization pin and stretch L glute; TPR and deep tissue throughout glute with focus on glute med, ITB, hamstrings                          PT Long Term Goals - 08/08/21 1204       PT LONG TERM GOAL #1   Title Pt will be independent with her HEP    Time 6    Period Weeks    Status New    Target Date 09/19/21      PT LONG TERM GOAL #2   Title Pt will demo L = R hip ROM    Baseline L SLR ~60 deg, R SLR ~80 deg    Time 6    Period Weeks    Status New    Target Date 09/19/21      PT LONG TERM GOAL #3   Title Pt will report at least 50% decrease in her pain    Time 6    Period Weeks    Status New    Target Date 09/19/21      PT LONG TERM GOAL #4   Title Pt will be able to return to running    Time 6    Period Weeks    Status New    Target Date 09/19/21      PT LONG TERM GOAL #5   Title Pt will improve FOTO score to predicted level    Time 6    Period Weeks    Status New     Target Date 09/19/21                   Plan - 08/15/21 1653     Clinical Impression Statement Treatment session worked heavily on improving her posterior hip/glute muscle spasm with manual work, stretching and initiating gentle strengthening. ITB appears tight and added stretching accordingly. L hip tightness increased when performing sidelying clam for R hip -- consider performing all of her strengthening exercises in standing instead next session.    Personal Factors and Comorbidities Age;Fitness;Time since onset of injury/illness/exacerbation    Examination-Activity Limitations Locomotion Level;Transfers;Bed Mobility;Stand    Examination-Participation Restrictions Community Activity    PT Treatment/Interventions ADLs/Self Care Home Management;Aquatic Therapy;Electrical Stimulation;Cryotherapy;Moist Heat;DME Instruction;Ultrasound;Iontophoresis 4mg /ml Dexamethasone;Gait training;Stair training;Functional mobility training;Therapeutic activities;Therapeutic exercise;Balance training;Neuromuscular re-education;Manual techniques;Patient/family education;Dry needling;Taping;Joint Manipulations;Spinal Manipulations;Passive range of motion    PT Next Visit Plan How was HEP? Continue hip stretching and strengthening. Initiate exercises in standing. Manual work as needed through [employer].    PT Home Exercise Plan Access Code: [email or web address]    Consulted and Agree with Plan of Care Patient             Patient will benefit from skilled therapeutic intervention in order to improve the following deficits and impairments:  Decreased range of motion, Increased fascial restricitons, Difficulty walking, Increased muscle spasms, Decreased activity tolerance, Pain, Hypomobility, Impaired flexibility, Decreased mobility, Decreased strength, Postural dysfunction  Visit Diagnosis: Cramp and spasm  Muscle weakness (generalized)  Other abnormalities of gait and mobility  Pain in left hip  Stiffness of  left hip, not elsewhere classified     Problem List Patient Active Problem List   Diagnosis Date Noted   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 07/27/2021   Fall 07/26/2021   Acute bilateral low back  pain without sciatica 07/25/2021   Syncope 05/31/2021   Acute gastritis without hemorrhage 05/31/2021   Hamstring strain, left, initial encounter 04/28/2021   Bipolar 1 disorder, mixed (HCC) 03/21/2021   No energy 02/28/2021   Lack of motivation 02/28/2021   Rectocele 12/06/2020   Toenail fungus 09/14/2020   Corn of toe 09/14/2020   Tapeworm 04/02/2020   Unintended weight loss 02/18/2020   Loss of taste 02/18/2020   Scalp itch 01/12/2020   Allergic reaction 01/09/2020   Mold exposure 12/24/2019   Chest tightness 12/24/2019   History of COVID-19 10/07/2019   COVID-19 virus infection 09/22/2019   Suspected COVID-19 virus infection 09/11/2019   Seborrheic keratoses 05/14/2019   Food allergy 01/29/2019   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   Chronic constipation 10/30/2018   Cervical radiculopathy 08/21/2018   Tonsillith 07/30/2018   Callus of foot 07/29/2018   Multiple food allergies 03/25/2018   Environmental allergies 03/25/2018   CFS (chronic fatigue syndrome) 11/27/2017   Perennial allergic rhinitis 08/13/2017   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   Memory changes 07/19/2017   Hyperlipidemia 07/11/2017   Elevated blood pressure reading 07/11/2017   History of colon resection 07/09/2017   History of irregular heartbeat 07/09/2017   Sinus bradycardia by electrocardiogram 07/09/2017   Left leg pain 03/18/2017   Right foot pain 03/18/2017   Menopausal symptoms 07/21/2016   Mood disorder in conditions classified elsewhere 07/21/2016   Plantar wart of left foot 02/29/2016   Dyspareunia in female 07/07/2015   Herpes genitalis in women 07/07/2015   Fibromyalgia 05/05/2015   Primary osteoarthritis involving multiple joints 05/05/2015   Thyroid activity decreased 05/05/2015    Chronic neck pain 05/05/2015   Hypothyroidism 09/04/2009   SEIZURE DISORDER 09/04/2009    [facility] April Ma L Franklin, [place], DPT 08/15/2021, 4:59 PM  [facility] [address] [place], [place], [address] Phone: [phone]   Fax:  [phone]  Name: Shaila Gilchrest MRN: [id number] Date of Birth: 04/16/1956

## 2021-08-17 DIAGNOSIS — M9902 Segmental and somatic dysfunction of thoracic region: Secondary | ICD-10-CM | POA: Diagnosis not present

## 2021-08-17 DIAGNOSIS — M9903 Segmental and somatic dysfunction of lumbar region: Secondary | ICD-10-CM | POA: Diagnosis not present

## 2021-08-17 DIAGNOSIS — M9904 Segmental and somatic dysfunction of sacral region: Secondary | ICD-10-CM | POA: Diagnosis not present

## 2021-08-17 DIAGNOSIS — M5442 Lumbago with sciatica, left side: Secondary | ICD-10-CM | POA: Diagnosis not present

## 2021-08-17 DIAGNOSIS — M5030 Other cervical disc degeneration, unspecified cervical region: Secondary | ICD-10-CM | POA: Diagnosis not present

## 2021-08-17 DIAGNOSIS — M4125 Other idiopathic scoliosis, thoracolumbar region: Secondary | ICD-10-CM | POA: Diagnosis not present

## 2021-08-17 DIAGNOSIS — M9901 Segmental and somatic dysfunction of cervical region: Secondary | ICD-10-CM | POA: Diagnosis not present

## 2021-08-18 ENCOUNTER — Ambulatory Visit: Payer: Medicare Other | Admitting: Physical Therapy

## 2021-08-18 ENCOUNTER — Other Ambulatory Visit: Payer: Self-pay

## 2021-08-18 DIAGNOSIS — R252 Cramp and spasm: Secondary | ICD-10-CM

## 2021-08-18 DIAGNOSIS — R2689 Other abnormalities of gait and mobility: Secondary | ICD-10-CM | POA: Diagnosis not present

## 2021-08-18 DIAGNOSIS — M25552 Pain in left hip: Secondary | ICD-10-CM | POA: Diagnosis not present

## 2021-08-18 DIAGNOSIS — M25652 Stiffness of left hip, not elsewhere classified: Secondary | ICD-10-CM

## 2021-08-18 DIAGNOSIS — M6281 Muscle weakness (generalized): Secondary | ICD-10-CM | POA: Diagnosis not present

## 2021-08-18 DIAGNOSIS — M47819 Spondylosis without myelopathy or radiculopathy, site unspecified: Secondary | ICD-10-CM | POA: Diagnosis not present

## 2021-08-18 DIAGNOSIS — M545 Low back pain, unspecified: Secondary | ICD-10-CM | POA: Diagnosis not present

## 2021-08-18 NOTE — Therapy (Signed)
[facility] [address] [place], [place], [address] Phone: [phone]   Fax:  [phone]  Physical Therapy Treatment  Patient Details  Name: Judy Russell MRN: [id number] Date of Birth: [birth year] Referring Provider (PT): Monica Becton, MD   Encounter Date: 08/18/2021   PT End of Session - 08/18/21 1530     Visit Number 3    Number of Visits 12    Date for PT Re-Evaluation 09/19/21    Authorization Type [employer] Medicare    PT Start Time 1530    PT Stop Time 1615    PT Time Calculation (min) 45 min    Activity Tolerance Patient tolerated treatment well    Behavior During Therapy [facility] for tasks assessed/performed             Past Medical History:  Diagnosis Date   Allergy    Fibromyalgia    HSV-2 (herpes simplex virus 2) infection    Seizures (HCC)    Thyroid disease     Past Surgical History:  Procedure Laterality Date   APPENDECTOMY     DILATION AND CURETTAGE OF UTERUS     OOPHORECTOMY Left     There were no vitals filed for this visit.   Subjective Assessment - 08/18/21 1612     Subjective Pt reports her hip locked up the next morning after last PT session. She states she would like a trial of TPDN despite feeling a lot of anxiety and fear of needles.    Limitations Walking;House hold activities;Sitting    How long can you sit comfortably? Has to get up and walk around    How long can you stand comfortably? During the day she's okay    How long can you walk comfortably? ~1/2 mile    Patient Stated Goals Get back to running    Currently in Pain? Yes    Pain Score 9     Pain Location Hip    Pain Orientation Left                               [facility] Adult PT Treatment/Exercise - 08/18/21 0001       Knee/Hip Exercises: Stretches   Hip Flexor Stretch Right;Left;30 seconds    Hip Flexor Stretch Limitations half kneeling    ITB Stretch Right;Left;30 seconds    Other  Knee/Hip Stretches supine bilat knee to chest x30 sec      Knee/Hip Exercises: Standing   Hip Abduction Stengthening;10 reps;Knee straight    Abduction Limitations red tband    Hip Extension Stengthening;10 reps    Extension Limitations red tband      Modalities   Modalities Moist Heat      Moist Heat Therapy   Number Minutes Moist Heat 10 Minutes    Moist Heat Location Hip      Manual Therapy   Manual Therapy Soft tissue mobilization;Joint mobilization    Manual therapy comments skilled assessment and palpation for TPDN and STM              Trigger Point Dry Needling - 08/18/21 0001     Consent Given? Yes    Education Handout Provided Yes    Muscles Treated Back/Hip Gluteus minimus;Gluteus medius    Gluteus Minimus Response Twitch response elicited;Palpable increased muscle length    Gluteus Medius Response Twitch response elicited;Palpable increased muscle length  PT Education - 08/18/21 1816     Education Details Discussed that her site of pain appears closely associated to ITB with possible bursitis. Discussed TPDN at length -- how it would feel, risks involved, post needle soreness. Provided appropriate hand out.    Person(s) Educated Patient    Methods Explanation;Demonstration;Tactile cues;Verbal cues;Handout    Comprehension Verbalized understanding;Returned demonstration;Verbal cues required;Tactile cues required                 PT Long Term Goals - 08/08/21 1204       PT LONG TERM GOAL #1   Title Pt will be independent with her HEP    Time 6    Period Weeks    Status New    Target Date 09/19/21      PT LONG TERM GOAL #2   Title Pt will demo L = R hip ROM    Baseline L SLR ~60 deg, R SLR ~80 deg    Time 6    Period Weeks    Status New    Target Date 09/19/21      PT LONG TERM GOAL #3   Title Pt will report at least 50% decrease in her pain    Time 6    Period Weeks    Status New    Target Date 09/19/21       PT LONG TERM GOAL #4   Title Pt will be able to return to running    Time 6    Period Weeks    Status New    Target Date 09/19/21      PT LONG TERM GOAL #5   Title Pt will improve FOTO score to predicted level    Time 6    Period Weeks    Status New    Target Date 09/19/21                   Plan - 08/18/21 1817     Clinical Impression Statement Re-worked pt's HEP to make it more able for her to perform. A lot of the sidelying and supine exercises have been difficult because she has had difficulty and pain with the bed. Continued to work on ITB stretching and manual work. Pt notes that her site of pain is not just in the spot near her glute/greater trochanter but also on the side of her knee -- discussed possible ITB syndrome leading to glute and QL tightness. Provided strengthening exercises in standing. Provided TPDN to L glute (did not piston due to pt's fear). Pt reports good improvement after performing this.    Personal Factors and Comorbidities Age;Fitness;Time since onset of injury/illness/exacerbation    Examination-Activity Limitations Locomotion Level;Transfers;Bed Mobility;Stand    Examination-Participation Restrictions Community Activity    Stability/Clinical Decision Making Evolving/Moderate complexity    Rehab Potential Good    PT Frequency Other (comment)   1-2x/wk per pt's schedule   PT Duration 6 weeks    PT Treatment/Interventions ADLs/Self Care Home Management;Aquatic Therapy;Electrical Stimulation;Cryotherapy;Moist Heat;DME Instruction;Ultrasound;Iontophoresis 4mg /ml Dexamethasone;Gait training;Stair training;Functional mobility training;Therapeutic activities;Therapeutic exercise;Balance training;Neuromuscular re-education;Manual techniques;Patient/family education;Dry needling;Taping;Joint Manipulations;Spinal Manipulations;Passive range of motion    PT Next Visit Plan How was HEP? Continue hip stretching and strengthening. Initiate exercises in standing.  Manual work/TPDN as needed through glutes.    PT Home Exercise Plan Access Code: [email or web address]    Consulted and Agree with Plan of Care Patient             Patient will benefit from  skilled therapeutic intervention in order to improve the following deficits and impairments:  Decreased range of motion, Increased fascial restricitons, Difficulty walking, Increased muscle spasms, Decreased activity tolerance, Pain, Hypomobility, Impaired flexibility, Decreased mobility, Decreased strength, Postural dysfunction  Visit Diagnosis: Cramp and spasm  Muscle weakness (generalized)  Other abnormalities of gait and mobility  Pain in left hip  Stiffness of left hip, not elsewhere classified     Problem List Patient Active Problem List   Diagnosis Date Noted   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 07/27/2021   Fall 07/26/2021   Acute bilateral low back pain without sciatica 07/25/2021   Syncope 05/31/2021   Acute gastritis without hemorrhage 05/31/2021   Hamstring strain, left, initial encounter 04/28/2021   Bipolar 1 disorder, mixed (HCC) 03/21/2021   No energy 02/28/2021   Lack of motivation 02/28/2021   Rectocele 12/06/2020   Toenail fungus 09/14/2020   Corn of toe 09/14/2020   Tapeworm 04/02/2020   Unintended weight loss 02/18/2020   Loss of taste 02/18/2020   Scalp itch 01/12/2020   Allergic reaction 01/09/2020   Mold exposure 12/24/2019   Chest tightness 12/24/2019   History of COVID-19 10/07/2019   COVID-19 virus infection 09/22/2019   Suspected COVID-19 virus infection 09/11/2019   Seborrheic keratoses 05/14/2019   Food allergy 01/29/2019   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   Chronic constipation 10/30/2018   Cervical radiculopathy 08/21/2018   Tonsillith 07/30/2018   Callus of foot 07/29/2018   Multiple food allergies 03/25/2018   Environmental allergies 03/25/2018   CFS (chronic fatigue syndrome) 11/27/2017   Perennial allergic rhinitis 08/13/2017   Idiopathic  hypotension 08/05/2017   Memory changes 07/19/2017   Hyperlipidemia 07/11/2017   Elevated blood pressure reading 07/11/2017   History of colon resection 07/09/2017   History of irregular heartbeat 07/09/2017   Sinus bradycardia by electrocardiogram 07/09/2017   Left leg pain 03/18/2017   Right foot pain 03/18/2017   Menopausal symptoms 07/21/2016   Mood disorder in conditions classified elsewhere 07/21/2016   Plantar wart of left foot 02/29/2016   Dyspareunia in female 07/07/2015   Herpes genitalis in women 07/07/2015   Fibromyalgia 05/05/2015   Primary osteoarthritis involving multiple joints 05/05/2015   Thyroid activity decreased 05/05/2015   Chronic neck pain 05/05/2015   Hypothyroidism 09/04/2009   SEIZURE DISORDER 09/04/2009    [facility] [address] [place], PT, DPT 08/18/2021, 6:22 PM  [facility] [place] [address] [place], [place], [address] Phone: [phone]   Fax:  [phone]  Name: Judy Russell MRN: [id number] Date of Birth: 11/16/1955

## 2021-08-19 ENCOUNTER — Ambulatory Visit: Payer: Medicare Other | Admitting: Family Medicine

## 2021-08-22 ENCOUNTER — Other Ambulatory Visit: Payer: Self-pay

## 2021-08-22 ENCOUNTER — Ambulatory Visit: Payer: Medicare Other | Admitting: Physical Therapy

## 2021-08-22 DIAGNOSIS — M25652 Stiffness of left hip, not elsewhere classified: Secondary | ICD-10-CM

## 2021-08-22 DIAGNOSIS — M6281 Muscle weakness (generalized): Secondary | ICD-10-CM

## 2021-08-22 DIAGNOSIS — R2689 Other abnormalities of gait and mobility: Secondary | ICD-10-CM | POA: Diagnosis not present

## 2021-08-22 DIAGNOSIS — R252 Cramp and spasm: Secondary | ICD-10-CM

## 2021-08-22 DIAGNOSIS — M47819 Spondylosis without myelopathy or radiculopathy, site unspecified: Secondary | ICD-10-CM | POA: Diagnosis not present

## 2021-08-22 DIAGNOSIS — M25552 Pain in left hip: Secondary | ICD-10-CM | POA: Diagnosis not present

## 2021-08-22 DIAGNOSIS — M545 Low back pain, unspecified: Secondary | ICD-10-CM | POA: Diagnosis not present

## 2021-08-22 NOTE — Therapy (Signed)
[facility] [place] [address] [place], [place], [address] Phone: [phone]   Fax:  [phone]  Physical Therapy Treatment  Patient Details  Name: Gearline Spilman MRN: [id number] Date of Birth: 05-14-1956 Referring Provider (PT): Monica Becton, MD   Encounter Date: 08/22/2021   PT End of Session - 08/22/21 1344     Visit Number 4    Number of Visits 12    Date for PT Re-Evaluation 09/19/21    Authorization Type [employer]HC Medicare    PT Start Time 1345    PT Stop Time 1430    PT Time Calculation (min) 45 min    Activity Tolerance Patient tolerated treatment well    Behavior During Therapy [facility] for tasks assessed/performed             Past Medical History:  Diagnosis Date   Allergy    Fibromyalgia    HSV-2 (herpes simplex virus 2) infection    Seizures (HCC)    Thyroid disease     Past Surgical History:  Procedure Laterality Date   APPENDECTOMY     DILATION AND CURETTAGE OF UTERUS     OOPHORECTOMY Left     There were no vitals filed for this visit.   Subjective Assessment - 08/22/21 1348     Subjective Pt notes she's stiff but did not lock up as bad. Pt did exercises before bed and thinks it helped a little bit. Pt states she's no longer seeing chiropractor. Pt thinks this weekend was a little better. Pt states she's been able to do the ellipitical for 6 minutes without any issues.    Limitations Walking;House hold activities;Sitting    How long can you sit comfortably? Has to get up and walk around    How long can you stand comfortably? During the day she's okay    How long can you walk comfortably? ~1/2 mile    Patient Stated Goals Get back to running    Currently in Pain? 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    Referring Provider (PT) Monica Becton, MD                           [facility] Adult PT Treatment/Exercise - 08/22/21 0001       Knee/Hip Exercises: Stretches   Other Knee/Hip Stretches supine bilat knee to chest x30 sec    Other Knee/Hip Stretches sidelying with L hip flexed, knee straight hanging off EOB x 2 min      Knee/Hip Exercises: Aerobic   Recumbent Bike L1 x 5 min      Knee/Hip Exercises: Standing   Hip Abduction Stengthening;10 reps;Knee straight;2 sets    Abduction Limitations red tband    Hip Extension Stengthening;10 reps;2 sets    Extension Limitations red tband    Other Standing Knee Exercises Trial of hip hike x 5 each side      Knee/Hip Exercises: Sidelying   Other Sidelying Knee/Hip Exercises knee tap forward in reverse clam, toe tap back clam 2x10  Manual Therapy   Manual therapy comments skilled assessment and palpation for TPDN and STM    Soft tissue mobilization STM glute max and lower lumbar paraspinals              Trigger Point Dry Needling - 08/22/21 0001     Consent Given? Yes    Education Handout Provided Previously provided    Muscles Treated Back/Hip Gluteus maximus;Gluteus medius;Lumbar multifidi    Gluteus Medius Response Palpable increased muscle length    Gluteus Maximus Response Palpable increased muscle length    Lumbar multifidi Response Palpable increased muscle length                   PT Education - 08/22/21 1442     Education Details Discussed sleeping without her tempurpedic as this may be too soft for her. Went over sleeping mechanics.    Person(s) Educated Patient    Methods Explanation;Demonstration;Tactile cues;Verbal cues;Handout    Comprehension Verbalized understanding;Returned demonstration;Verbal cues required;Tactile cues required                 PT Long Term Goals - 08/08/21 1204        PT LONG TERM GOAL #1   Title Pt will be independent with her HEP    Time 6    Period Weeks    Status New    Target Date 09/19/21      PT LONG TERM GOAL #2   Title Pt will demo L = R hip ROM    Baseline L SLR ~60 deg, R SLR ~80 deg    Time 6    Period Weeks    Status New    Target Date 09/19/21      PT LONG TERM GOAL #3   Title Pt will report at least 50% decrease in her pain    Time 6    Period Weeks    Status New    Target Date 09/19/21      PT LONG TERM GOAL #4   Title Pt will be able to return to running    Time 6    Period Weeks    Status New    Target Date 09/19/21      PT LONG TERM GOAL #5   Title Pt will improve FOTO score to predicted level    Time 6    Period Weeks    Status New    Target Date 09/19/21                   Plan - 08/22/21 1421     Clinical Impression Statement Pt with less pain in L hip/greater trochanter area. Complains of pain more along lumbar and sacral area. Provided TPDN along lumbar paraspinals, glutes and piriformis to try and address. Continued to work on pelvic and hip strengthening. Initiate increased core strengthening to address increased lumbar pain.    Personal Factors and Comorbidities Age;Fitness;Time since onset of injury/illness/exacerbation    Examination-Activity Limitations Locomotion Level;Transfers;Bed Mobility;Stand    Examination-Participation Restrictions Community Activity    Stability/Clinical Decision Making Evolving/Moderate complexity    Rehab Potential Good    PT Frequency Other (comment)   1-2x/wk per pt's schedule   PT Duration 6 weeks    PT Treatment/Interventions ADLs/Self Care Home Management;Aquatic Therapy;Electrical Stimulation;Cryotherapy;Moist Heat;DME Instruction;Ultrasound;Iontophoresis 4mg /ml Dexamethasone;Gait training;Stair training;Functional mobility training;Therapeutic activities;Therapeutic exercise;Balance training;Neuromuscular re-education;Manual techniques;Patient/family  education;Dry needling;Taping;Joint Manipulations;Spinal Manipulations;Passive range of motion    PT Next Visit Plan How was HEP? Continue  hip stretching and strengthening. Initiate exercises in standing. Manual work/TPDN as needed through glutes.    PT Home Exercise Plan Access Code: [email or web address]    Consulted and Agree with Plan of Care Patient             Patient will benefit from skilled therapeutic intervention in order to improve the following deficits and impairments:  Decreased range of motion, Increased fascial restricitons, Difficulty walking, Increased muscle spasms, Decreased activity tolerance, Pain, Hypomobility, Impaired flexibility, Decreased mobility, Decreased strength, Postural dysfunction  Visit Diagnosis: Cramp and spasm  Muscle weakness (generalized)  Other abnormalities of gait and mobility  Pain in left hip  Stiffness of left hip, not elsewhere classified     Problem List Patient Active Problem List   Diagnosis Date Noted   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 07/27/2021   Fall 07/26/2021   Acute bilateral low back pain without sciatica 07/25/2021   Syncope 05/31/2021   Acute gastritis without hemorrhage 05/31/2021   Hamstring strain, left, initial encounter 04/28/2021   Bipolar 1 disorder, mixed (HCC) 03/21/2021   No energy 02/28/2021   Lack of motivation 02/28/2021   Rectocele 12/06/2020   Toenail fungus 09/14/2020   Corn of toe 09/14/2020   Tapeworm 04/02/2020   Unintended weight loss 02/18/2020   Loss of taste 02/18/2020   Scalp itch 01/12/2020   Allergic reaction 01/09/2020   Mold exposure 12/24/2019   Chest tightness 12/24/2019   History of COVID-19 10/07/2019   COVID-19 virus infection 09/22/2019   Suspected COVID-19 virus infection 09/11/2019   Seborrheic keratoses 05/14/2019   Food allergy 01/29/2019   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   Chronic constipation 10/30/2018   Cervical radiculopathy 08/21/2018   Tonsillith 07/30/2018   Callus  of foot 07/29/2018   Multiple food allergies 03/25/2018   Environmental allergies 03/25/2018   CFS (chronic fatigue syndrome) 11/27/2017   Perennial allergic rhinitis 08/13/2017   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   Memory changes 07/19/2017   Hyperlipidemia 07/11/2017   Elevated blood pressure reading 07/11/2017   History of colon resection 07/09/2017   History of irregular heartbeat 07/09/2017   Sinus bradycardia by electrocardiogram 07/09/2017   Left leg pain 03/18/2017   Right foot pain 03/18/2017   Menopausal symptoms 07/21/2016   Mood disorder in conditions classified elsewhere 07/21/2016   Plantar wart of left foot 02/29/2016   Dyspareunia in female 07/07/2015   Herpes genitalis in women 07/07/2015   Fibromyalgia 05/05/2015   Primary osteoarthritis involving multiple joints 05/05/2015   Thyroid activity decreased 05/05/2015   Chronic neck pain 05/05/2015   Hypothyroidism 09/04/2009   SEIZURE DISORDER 09/04/2009    [facility] [address] [place], [place], DPT 08/22/2021, 2:44 PM  [facility] [place] [address] [place], [place], [address] Phone: [phone]   Fax:  [phone]  Name: Aleenah Justus MRN: [id number] Date of Birth: 1955-12-28

## 2021-08-25 ENCOUNTER — Ambulatory Visit: Payer: Medicare Other | Admitting: Physical Therapy

## 2021-08-25 ENCOUNTER — Other Ambulatory Visit: Payer: Self-pay

## 2021-08-25 DIAGNOSIS — R2689 Other abnormalities of gait and mobility: Secondary | ICD-10-CM

## 2021-08-25 DIAGNOSIS — R252 Cramp and spasm: Secondary | ICD-10-CM

## 2021-08-25 DIAGNOSIS — M25652 Stiffness of left hip, not elsewhere classified: Secondary | ICD-10-CM

## 2021-08-25 DIAGNOSIS — M25552 Pain in left hip: Secondary | ICD-10-CM | POA: Diagnosis not present

## 2021-08-25 DIAGNOSIS — E039 Hypothyroidism, unspecified: Secondary | ICD-10-CM | POA: Diagnosis not present

## 2021-08-25 DIAGNOSIS — M545 Low back pain, unspecified: Secondary | ICD-10-CM | POA: Diagnosis not present

## 2021-08-25 DIAGNOSIS — M47819 Spondylosis without myelopathy or radiculopathy, site unspecified: Secondary | ICD-10-CM | POA: Diagnosis not present

## 2021-08-25 DIAGNOSIS — M6281 Muscle weakness (generalized): Secondary | ICD-10-CM | POA: Diagnosis not present

## 2021-08-25 NOTE — Therapy (Signed)
[facility] [place] [address] [place], [place], [address] Phone: [phone]   Fax:  [phone]  Physical Therapy Treatment  Patient Details  Name: Judy Russell MRN: [id number] Date of Birth: 05-21-1956 Referring Provider (PT): Monica Becton, MD   Encounter Date: 08/25/2021   PT End of Session - 08/25/21 1659     Visit Number 5    Number of Visits 12    Date for PT Re-Evaluation 09/19/21    Authorization Type UHC Medicare    PT Start Time 1659    PT Stop Time 1745    PT Time Calculation (min) 46 min    Activity Tolerance Patient tolerated treatment well    Behavior During Therapy WFL for tasks assessed/performed             Past Medical History:  Diagnosis Date   Allergy    Fibromyalgia    HSV-2 (herpes simplex virus 2) infection    Seizures (HCC)    Thyroid disease     Past Surgical History:  Procedure Laterality Date   APPENDECTOMY     DILATION AND CURETTAGE OF UTERUS     OOPHORECTOMY Left     There were no vitals filed for this visit.   Subjective Assessment - 08/25/21 1704     Subjective Pt reports she has gone 4 days without using the cane. Pt states she did exercises yesterday in the sauna. Pt states she was in pain last night. Pt reports her neck has increased stiffness.    Limitations Walking;House hold activities;Sitting    How long can you sit comfortably? Has to get up and walk around    How long can you stand comfortably? During the day she's okay    How long can you walk comfortably? ~1/2 mile    Patient Stated Goals Get back to running                               [facility] Adult PT Treatment/Exercise - 08/25/21 0001       Knee/Hip Exercises: Stretches   Other Knee/Hip Stretches low trunk rotation 3x10 sec hold    Other Knee/Hip Stretches L lateral hip shift 3x10 sec hold; open/close book with hand behind head x10 each side      Knee/Hip Exercises: Aerobic    Recumbent Bike L1 x 6 min      Knee/Hip Exercises: Standing   Hip Abduction --    Abduction Limitations --    Hip Extension --    Extension Limitations --    Other Standing Knee Exercises Hip hike 2x5      Manual Therapy   Manual therapy comments skilled assessment and palpation for TPDN and STM    Soft tissue mobilization bilat UT and cervical paraspinals, L thoracic paraspinal and QL              Trigger Point Dry Needling - 08/25/21 0001     Consent Given? Yes    Education Handout Provided Previously provided    Muscles Treated Head and Neck Upper trapezius;Cervical multifidi    Upper Trapezius Response Twitch reponse elicited;Palpable increased muscle length    Cervical multifidi Response Twitch reponse elicited;Palpable increased muscle length                        PT Long Term Goals - 08/08/21 1204       PT  LONG TERM GOAL #1   Title Pt will be independent with her HEP    Time 6    Period Weeks    Status New    Target Date 09/19/21      PT LONG TERM GOAL #2   Title Pt will demo L = R hip ROM    Baseline L SLR ~60 deg, R SLR ~80 deg    Time 6    Period Weeks    Status New    Target Date 09/19/21      PT LONG TERM GOAL #3   Title Pt will report at least 50% decrease in her pain    Time 6    Period Weeks    Status New    Target Date 09/19/21      PT LONG TERM GOAL #4   Title Pt will be able to return to running    Time 6    Period Weeks    Status New    Target Date 09/19/21      PT LONG TERM GOAL #5   Title Pt will improve FOTO score to predicted level    Time 6    Period Weeks    Status New    Target Date 09/19/21                   Plan - 08/25/21 1759     Clinical Impression Statement No complaints of hip/glute pain. Reports more pain along her cervical and thoracic spine stiffening up. Continued L QL tightness noted. Worked on additional manual work, stretching and added hip hiking for strengthening.    Personal  Factors and Comorbidities Age;Fitness;Time since onset of injury/illness/exacerbation    Examination-Activity Limitations Locomotion Level;Transfers;Bed Mobility;Stand    Examination-Participation Restrictions Community Activity    Stability/Clinical Decision Making Evolving/Moderate complexity    Rehab Potential Good    PT Frequency Other (comment)   1-2x/wk per pt's schedule   PT Duration 6 weeks    PT Treatment/Interventions ADLs/Self Care Home Management;Aquatic Therapy;Electrical Stimulation;Cryotherapy;Moist Heat;DME Instruction;Ultrasound;Iontophoresis 4mg /ml Dexamethasone;Gait training;Stair training;Functional mobility training;Therapeutic activities;Therapeutic exercise;Balance training;Neuromuscular re-education;Manual techniques;Patient/family education;Dry needling;Taping;Joint Manipulations;Spinal Manipulations;Passive range of motion    PT Next Visit Plan How was HEP? Continue hip stretching and strengthening. Initiate exercises in standing and core strengthening. Manual work/TPDN as needed through C and T spine.    PT Home Exercise Plan Access Code: [email or web address]    Consulted and Agree with Plan of Care Patient             Patient will benefit from skilled therapeutic intervention in order to improve the following deficits and impairments:  Decreased range of motion, Increased fascial restricitons, Difficulty walking, Increased muscle spasms, Decreased activity tolerance, Pain, Hypomobility, Impaired flexibility, Decreased mobility, Decreased strength, Postural dysfunction  Visit Diagnosis: Cramp and spasm  Muscle weakness (generalized)  Other abnormalities of gait and mobility  Pain in left hip  Stiffness of left hip, not elsewhere classified     Problem List Patient Active Problem List   Diagnosis Date Noted   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 07/27/2021   Fall 07/26/2021   Acute bilateral low back pain without sciatica 07/25/2021   Syncope 05/31/2021    Acute gastritis without hemorrhage 05/31/2021   Hamstring strain, left, initial encounter 04/28/2021   Bipolar 1 disorder, mixed (HCC) 03/21/2021   No energy 02/28/2021   Lack of motivation 02/28/2021   Rectocele 12/06/2020   Toenail fungus 09/14/2020   Corn of toe 09/14/2020  Tapeworm 04/02/2020   Unintended weight loss 02/18/2020   Loss of taste 02/18/2020   Scalp itch 01/12/2020   Allergic reaction 01/09/2020   Mold exposure 12/24/2019   Chest tightness 12/24/2019   History of COVID-19 10/07/2019   COVID-19 virus infection 09/22/2019   Suspected COVID-19 virus infection 09/11/2019   Seborrheic keratoses 05/14/2019   Food allergy 01/29/2019   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   Chronic constipation 10/30/2018   Cervical radiculopathy 08/21/2018   Tonsillith 07/30/2018   Callus of foot 07/29/2018   Multiple food allergies 03/25/2018   Environmental allergies 03/25/2018   CFS (chronic fatigue syndrome) 11/27/2017   Perennial allergic rhinitis 08/13/2017   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   Memory changes 07/19/2017   Hyperlipidemia 07/11/2017   Elevated blood pressure reading 07/11/2017   History of colon resection 07/09/2017   History of irregular heartbeat 07/09/2017   Sinus bradycardia by electrocardiogram 07/09/2017   Left leg pain 03/18/2017   Right foot pain 03/18/2017   Menopausal symptoms 07/21/2016   Mood disorder in conditions classified elsewhere 07/21/2016   Plantar wart of left foot 02/29/2016   Dyspareunia in female 07/07/2015   Herpes genitalis in women 07/07/2015   Fibromyalgia 05/05/2015   Primary osteoarthritis involving multiple joints 05/05/2015   Thyroid activity decreased 05/05/2015   Chronic neck pain 05/05/2015   Hypothyroidism 09/04/2009   SEIZURE DISORDER 09/04/2009    [facility] [address] [place], [place], DPT 08/25/2021, 6:05 PM  [facility] [place] [address] [place] [place], [place], [address] Phone:  [phone]   Fax:  [phone]  Name: Judy Russell MRN: [id number] Date of Birth: [birth year]

## 2021-08-26 ENCOUNTER — Encounter: Payer: Self-pay | Admitting: Physician Assistant

## 2021-08-26 LAB — TSH: TSH: 0.81 mIU/L (ref 0.40–4.50)

## 2021-08-26 NOTE — Progress Notes (Signed)
Normal range but more toward too much thyroid. How do you feel?

## 2021-08-29 ENCOUNTER — Ambulatory Visit: Payer: Medicare Other | Admitting: Physical Therapy

## 2021-08-29 ENCOUNTER — Other Ambulatory Visit: Payer: Self-pay

## 2021-08-29 DIAGNOSIS — M25552 Pain in left hip: Secondary | ICD-10-CM

## 2021-08-29 DIAGNOSIS — M47819 Spondylosis without myelopathy or radiculopathy, site unspecified: Secondary | ICD-10-CM | POA: Diagnosis not present

## 2021-08-29 DIAGNOSIS — R252 Cramp and spasm: Secondary | ICD-10-CM

## 2021-08-29 DIAGNOSIS — M6281 Muscle weakness (generalized): Secondary | ICD-10-CM | POA: Diagnosis not present

## 2021-08-29 DIAGNOSIS — M545 Low back pain, unspecified: Secondary | ICD-10-CM | POA: Diagnosis not present

## 2021-08-29 DIAGNOSIS — R2689 Other abnormalities of gait and mobility: Secondary | ICD-10-CM

## 2021-08-29 DIAGNOSIS — M25652 Stiffness of left hip, not elsewhere classified: Secondary | ICD-10-CM | POA: Diagnosis not present

## 2021-08-29 NOTE — Therapy (Signed)
[facility] [place] [address] [place], [place], [address] Phone: [phone]   Fax:  [phone]  Physical Therapy Treatment  Patient Details  Name: Judy Russell MRN: [id number] Date of Birth: 02-16-1956 Referring Provider (PT): Monica Becton, MD   Encounter Date: 08/29/2021   PT End of Session - 08/29/21 1346     Visit Number 6    Number of Visits 12    Date for PT Re-Evaluation 09/19/21    Authorization Type UHC Medicare    PT Start Time 1346    PT Stop Time 1430    PT Time Calculation (min) 44 min    Activity Tolerance Patient tolerated treatment well    Behavior During Therapy [facility] for tasks assessed/performed             Past Medical History:  Diagnosis Date   Allergy    Fibromyalgia    HSV-2 (herpes simplex virus 2) infection    Seizures (HCC)    Thyroid disease     Past Surgical History:  Procedure Laterality Date   APPENDECTOMY     DILATION AND CURETTAGE OF UTERUS     OOPHORECTOMY Left     There were no vitals filed for this visit.   Subjective Assessment - 08/29/21 1350     Subjective Pt states that she woke up this morning and did not lock up. Pt reports she did lock up yesterday after napping in bed. She states that she felt pretty good in the UTs after last TPDN session.    Limitations Walking;House hold activities;Sitting    How long can you sit comfortably? Has to get up and walk around    How long can you stand comfortably? During the day she's okay    How long can you walk comfortably? ~1/2 mile    Patient Stated Goals Get back to running    Currently in Pain? 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    Referring Provider (PT) Monica Becton, MD                           [facility] Adult PT  Treatment/Exercise - 08/29/21 0001       Knee/Hip Exercises: Aerobic   Elliptical L1 x 8 min      Knee/Hip Exercises: Standing   Other Standing Knee Exercises diagonal chop down x10 green tband;    Other Standing Knee Exercises shoulder extension green tband 2x10      Shoulder Exercises: Prone   Other Prone Exercises low trap setting x10 one at a time each side      Shoulder Exercises: Sidelying   Other Sidelying Exercises Open/close book 10x5 sec hold      Manual Therapy   Manual therapy comments skilled assessment and palpation for TPDN and STM    Joint Mobilization scapular mobilization in all directions    Soft tissue mobilization STM & TPR low and mid trap, thoracic paraspinals              Trigger Point Dry Needling - 08/29/21 0001     Consent Given? Yes    Education Handout Provided Previously provided  Muscles Treated Upper Quadrant Lower trapezius;Middle trapezius    Lower trapezius Response Twitch response elicited;Palpable increased muscle length    Middle trapezius Response Twitch response elicited;Palpable increased muscle length                        PT Long Term Goals - 08/08/21 1204       PT LONG TERM GOAL #1   Title Pt will be independent with her HEP    Time 6    Period Weeks    Status New    Target Date 09/19/21      PT LONG TERM GOAL #2   Title Pt will demo L = R hip ROM    Baseline L SLR ~60 deg, R SLR ~80 deg    Time 6    Period Weeks    Status New    Target Date 09/19/21      PT LONG TERM GOAL #3   Title Pt will report at least 50% decrease in her pain    Time 6    Period Weeks    Status New    Target Date 09/19/21      PT LONG TERM GOAL #4   Title Pt will be able to return to running    Time 6    Period Weeks    Status New    Target Date 09/19/21      PT LONG TERM GOAL #5   Title Pt will improve FOTO score to predicted level    Time 6    Period Weeks    Status New    Target Date 09/19/21                    Plan - 08/29/21 1525     Clinical Impression Statement Pt reports feeling very close to her normal. Worked on L chronic low trap trigger point with manual work and TPDN. Worked on thoracic mobility and included standing core strengthening with green tband. Pt reports no huge issues with upper trap now - just general soreness. Attempting to slowly progress pt's activity to her normal level with increased time on elliptical.    Personal Factors and Comorbidities Age;Fitness;Time since onset of injury/illness/exacerbation    Examination-Activity Limitations Locomotion Level;Transfers;Bed Mobility;Stand    Examination-Participation Restrictions Community Activity    Stability/Clinical Decision Making Evolving/Moderate complexity    Rehab Potential Good    PT Frequency Other (comment)   1-2x/wk per pt's schedule   PT Duration 6 weeks    PT Treatment/Interventions ADLs/Self Care Home Management;Aquatic Therapy;Electrical Stimulation;Cryotherapy;Moist Heat;DME Instruction;Ultrasound;Iontophoresis 4mg /ml Dexamethasone;Gait training;Stair training;Functional mobility training;Therapeutic activities;Therapeutic exercise;Balance training;Neuromuscular re-education;Manual techniques;Patient/family education;Dry needling;Taping;Joint Manipulations;Spinal Manipulations;Passive range of motion    PT Next Visit Plan How was HEP? Continue hip stretching and strengthening. Initiate exercises in standing and core strengthening. Manual work/TPDN as needed through C and T spine.    PT Home Exercise Plan Access Code: [email or web address]    Consulted and Agree with Plan of Care Patient             Patient will benefit from skilled therapeutic intervention in order to improve the following deficits and impairments:  Decreased range of motion, Increased fascial restricitons, Difficulty walking, Increased muscle spasms, Decreased activity tolerance, Pain, Hypomobility, Impaired flexibility, Decreased mobility,  Decreased strength, Postural dysfunction  Visit Diagnosis: Cramp and spasm  Muscle weakness (generalized)  Other abnormalities of gait and mobility  Pain in left hip  Stiffness of left  hip, not elsewhere classified     Problem List Patient Active Problem List   Diagnosis Date Noted   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 07/27/2021   Fall 07/26/2021   Acute bilateral low back pain without sciatica 07/25/2021   Syncope 05/31/2021   Acute gastritis without hemorrhage 05/31/2021   Hamstring strain, left, initial encounter 04/28/2021   Bipolar 1 disorder, mixed (HCC) 03/21/2021   No energy 02/28/2021   Lack of motivation 02/28/2021   Rectocele 12/06/2020   Toenail fungus 09/14/2020   Corn of toe 09/14/2020   Tapeworm 04/02/2020   Unintended weight loss 02/18/2020   Loss of taste 02/18/2020   Scalp itch 01/12/2020   Allergic reaction 01/09/2020   Mold exposure 12/24/2019   Chest tightness 12/24/2019   History of COVID-19 10/07/2019   COVID-19 virus infection 09/22/2019   Suspected COVID-19 virus infection 09/11/2019   Seborrheic keratoses 05/14/2019   Food allergy 01/29/2019   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   Chronic constipation 10/30/2018   Cervical radiculopathy 08/21/2018   Tonsillith 07/30/2018   Callus of foot 07/29/2018   Multiple food allergies 03/25/2018   Environmental allergies 03/25/2018   CFS (chronic fatigue syndrome) 11/27/2017   Perennial allergic rhinitis 08/13/2017   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   Memory changes 07/19/2017   Hyperlipidemia 07/11/2017   Elevated blood pressure reading 07/11/2017   History of colon resection 07/09/2017   History of irregular heartbeat 07/09/2017   Sinus bradycardia by electrocardiogram 07/09/2017   Left leg pain 03/18/2017   Right foot pain 03/18/2017   Menopausal symptoms 07/21/2016   Mood disorder in conditions classified elsewhere 07/21/2016   Plantar wart of left foot 02/29/2016   Dyspareunia in female  07/07/2015   Herpes genitalis in women 07/07/2015   Fibromyalgia 05/05/2015   Primary osteoarthritis involving multiple joints 05/05/2015   Thyroid activity decreased 05/05/2015   Chronic neck pain 05/05/2015   Hypothyroidism 09/04/2009   SEIZURE DISORDER 09/04/2009    Avala April Ma L Orwin, [place], DPT 08/29/2021, 3:30 PM  [facility] [address] [place] [address] [place], [place], [address] Phone: [phone]   Fax:  [phone]  Name: Judy Russell MRN: [id number] Date of Birth: [birth year]

## 2021-08-30 ENCOUNTER — Ambulatory Visit (INDEPENDENT_AMBULATORY_CARE_PROVIDER_SITE_OTHER): Payer: Medicare Other | Admitting: Sports Medicine

## 2021-08-30 DIAGNOSIS — M47819 Spondylosis without myelopathy or radiculopathy, site unspecified: Secondary | ICD-10-CM | POA: Diagnosis not present

## 2021-08-30 NOTE — Assessment & Plan Note (Addendum)
This is a very pleasant 66 year old female, she has a long history of cervical, thoracic, lumbar pain, worse at night, x-rays showed multiple degenerative processes cervical, thoracic, and lumbar spine, we avoided NSAIDs due to peptic ulcer disease. I asked her to do some prednisone, double her acid blocker and added Lyrica, we also added physical therapy. She opted out of medications, and would like to avoid medications in the future. She is working hard with physical therapy and she tells me yesterday was her first day completely pain-free. She also tells me she would not want to consider an MRI or any other type of procedure, continue aggressive therapy at home once she finishes formal PT, return as needed.

## 2021-08-30 NOTE — Progress Notes (Signed)
° ° °  Procedures performed today:    None.  Independent interpretation of notes and tests performed by another provider:   None.  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 I asked her to do some prednisone, double her acid blocker and added Lyrica, we also added physical therapy. She opted out of medications, and would like to avoid medications in the future. She is working hard with physical therapy and she tells me yesterday was her first day completely pain-free. She also tells me she would not want to consider an MRI or any other type of procedure, continue aggressive therapy at home once she finishes formal PT, return as needed.    ___________________________________________ Gwen Her. Dianah Field, M.D., ABFM., CAQSM. Primary Care and [place] Instructor of [employer] [facility] of [facility]

## 2021-09-01 ENCOUNTER — Ambulatory Visit: Payer: Medicare Other | Attending: Sports Medicine | Admitting: Physical Therapy

## 2021-09-01 ENCOUNTER — Other Ambulatory Visit: Payer: Self-pay

## 2021-09-01 DIAGNOSIS — M25652 Stiffness of left hip, not elsewhere classified: Secondary | ICD-10-CM | POA: Insufficient documentation

## 2021-09-01 DIAGNOSIS — R2689 Other abnormalities of gait and mobility: Secondary | ICD-10-CM | POA: Diagnosis present

## 2021-09-01 DIAGNOSIS — M25552 Pain in left hip: Secondary | ICD-10-CM | POA: Insufficient documentation

## 2021-09-01 DIAGNOSIS — M6281 Muscle weakness (generalized): Secondary | ICD-10-CM | POA: Diagnosis present

## 2021-09-01 DIAGNOSIS — R252 Cramp and spasm: Secondary | ICD-10-CM | POA: Diagnosis present

## 2021-09-01 NOTE — Therapy (Signed)
[place] [facility] ?[address] [place] [address] ?[place], [place], [address] ?Phone: [phone]   Fax:  [phone] ? ?Physical Therapy Treatment ? ?Patient Details  ?Name: Judy Russell ?MRN: [id number] ?Date of Birth: [birth year] ?Referring Provider (PT): Monica Becton, MD ? ? ?Encounter Date: 09/01/2021 ? ? PT End of Session - 09/01/21 0758   ? ? Visit Number 7   ? Number of Visits 12   ? Date for PT Re-Evaluation 09/19/21   ? Authorization Type UHC Medicare   ? PT Start Time 0800   ? PT Stop Time 0845   ? PT Time Calculation (min) 45 min   ? Activity Tolerance Patient tolerated treatment well   ? Behavior During Therapy [facility] for tasks assessed/performed   ? ?  ?  ? ?  ? ? ?Past Medical History:  ?Diagnosis Date  ? Allergy   ? Fibromyalgia   ? HSV-2 (herpes simplex virus 2) infection   ? Seizures (HCC)   ? Thyroid disease   ? ? ?Past Surgical History:  ?Procedure Laterality Date  ? APPENDECTOMY    ? DILATION AND CURETTAGE OF UTERUS    ? OOPHORECTOMY Left   ? ? ?There were no vitals filed for this visit. ? ? Subjective Assessment - 09/01/21 0758   ? ? Subjective Pt saw Dr. Karie Schwalbe yesterday. Pt states she had been doing really good all week but she did wake up locked up this morning.   ? Limitations Walking;House hold activities;Sitting   ? How long can you sit comfortably? Has to get up and walk around   ? How long can you stand comfortably? During the day she's okay   ? How long can you walk comfortably? ~1/2 mile   ? Patient Stated Goals Get back to running   ? ?  ?  ? ?  ? ? ? ? ? [facility] PT Assessment - 09/01/21 0001   ? ?  ? Assessment  ? 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 Referring Provider (PT) Monica Becton, MD   ? ?  ?  ? ?  ? ? ? ? ? ? ? ? ? ? ? ? ? ? ? ? [facility] Adult PT Treatment/Exercise - 09/01/21 0001   ? ?  ? Knee/Hip Exercises: Stretches  ? Other Knee/Hip Stretches child's pose x30 sec; with lateral flexion x60  sec; cat/cow 5x10 sec   ? Other Knee/Hip Stretches L stretch x30 sec with lateral flexion x20 sec to the right   ?  ? Knee/Hip Exercises: Standing  ? Forward Step Up Right;Left;10 reps;Step Height: 4"   ? Forward Step Up Limitations Runner's step up   ? Other Standing Knee Exercises lateral stepping green tband 2x10   ?  ? Knee/Hip Exercises: Prone  ? Other Prone Exercises quadruped: alternating leg extensions x10 each side   ?  ? Manual Therapy  ? Soft tissue mobilization STM & TPR L QL and lumbar paraspinal   ? ?  ?  ? ?  ? ? ? ? ? ? ? ? ? ? ? ? ? ? ? PT Long Term Goals - 08/08/21 1204   ? ?  ? PT LONG TERM GOAL #1  ? Title Pt will be independent with her HEP   ? Time 6   ? Period Weeks   ? Status New   ? Target Date 09/19/21   ?  ? PT LONG TERM GOAL #  Chest tightness 12/24/2019  ? History of COVID-19 10/07/2019  ? COVID-19 virus infection 09/22/2019  ? Suspected COVID-19 virus infection 09/11/2019  ? Seborrheic keratoses 05/14/2019  ? Food allergy 01/29/2019  ? 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  ? Chronic constipation 10/30/2018  ? Cervical radiculopathy 08/21/2018  ? Tonsillith 07/30/2018  ? Callus of foot 07/29/2018  ? Multiple food allergies 03/25/2018  ? Environmental allergies 03/25/2018  ? CFS (chronic fatigue syndrome) 11/27/2017  ? Perennial allergic rhinitis 08/13/2017  ? 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  ? Memory changes 07/19/2017  ? Hyperlipidemia 07/11/2017  ? Elevated blood pressure reading 07/11/2017  ? History of colon resection 07/09/2017  ? History of irregular heartbeat 07/09/2017  ? Sinus bradycardia by electrocardiogram 07/09/2017  ? Left leg pain 03/18/2017  ? Right foot pain 03/18/2017  ? Menopausal symptoms 07/21/2016  ? Mood disorder in conditions classified elsewhere 07/21/2016  ? Plantar wart of left foot 02/29/2016  ? Dyspareunia in female 07/07/2015  ? Herpes genitalis in women 07/07/2015  ? Fibromyalgia 05/05/2015  ? Primary osteoarthritis involving multiple joints 05/05/2015  ? Thyroid activity decreased 05/05/2015  ? Chronic neck pain 05/05/2015  ? Hypothyroidism 09/04/2009  ? SEIZURE DISORDER 09/04/2009  ? ? ?Judy Russell Judy Russell, PT, DPT ?09/01/2021, 8:48 AM ? ?[place] [facility] ?[address] [place] [address] ?[place], [place], [address] ?Phone: [phone]   Fax:  [phone] ? ?Name: Judy Russell ?MRN: [id number] ?Date of Birth: 1956-01-12 ? ? ? ?  [place] [facility] ?[address] [place] [address] ?[place], [place], [address] ?Phone: [phone]   Fax:  [phone] ? ?Physical Therapy Treatment ? ?Patient Details  ?Name: Judy Russell ?MRN: [id number] ?Date of Birth: [birth year] ?Referring Provider (PT): Monica Becton, MD ? ? ?Encounter Date: 09/01/2021 ? ? PT End of Session - 09/01/21 0758   ? ? Visit Number 7   ? Number of Visits 12   ? Date for PT Re-Evaluation 09/19/21   ? Authorization Type UHC Medicare   ? PT Start Time 0800   ? PT Stop Time 0845   ? PT Time Calculation (min) 45 min   ? Activity Tolerance Patient tolerated treatment well   ? Behavior During Therapy [facility] for tasks assessed/performed   ? ?  ?  ? ?  ? ? ?Past Medical History:  ?Diagnosis Date  ? Allergy   ? Fibromyalgia   ? HSV-2 (herpes simplex virus 2) infection   ? Seizures (HCC)   ? Thyroid disease   ? ? ?Past Surgical History:  ?Procedure Laterality Date  ? APPENDECTOMY    ? DILATION AND CURETTAGE OF UTERUS    ? OOPHORECTOMY Left   ? ? ?There were no vitals filed for this visit. ? ? Subjective Assessment - 09/01/21 0758   ? ? Subjective Pt saw Dr. Karie Schwalbe yesterday. Pt states she had been doing really good all week but she did wake up locked up this morning.   ? Limitations Walking;House hold activities;Sitting   ? How long can you sit comfortably? Has to get up and walk around   ? How long can you stand comfortably? During the day she's okay   ? How long can you walk comfortably? ~1/2 mile   ? Patient Stated Goals Get back to running   ? ?  ?  ? ?  ? ? ? ? ? [facility] PT Assessment - 09/01/21 0001   ? ?  ? Assessment  ? 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 Referring Provider (PT) Monica Becton, MD   ? ?  ?  ? ?  ? ? ? ? ? ? ? ? ? ? ? ? ? ? ? ? [facility] Adult PT Treatment/Exercise - 09/01/21 0001   ? ?  ? Knee/Hip Exercises: Stretches  ? Other Knee/Hip Stretches child's pose x30 sec; with lateral flexion x60  sec; cat/cow 5x10 sec   ? Other Knee/Hip Stretches L stretch x30 sec with lateral flexion x20 sec to the right   ?  ? Knee/Hip Exercises: Standing  ? Forward Step Up Right;Left;10 reps;Step Height: 4"   ? Forward Step Up Limitations Runner's step up   ? Other Standing Knee Exercises lateral stepping green tband 2x10   ?  ? Knee/Hip Exercises: Prone  ? Other Prone Exercises quadruped: alternating leg extensions x10 each side   ?  ? Manual Therapy  ? Soft tissue mobilization STM & TPR L QL and lumbar paraspinal   ? ?  ?  ? ?  ? ? ? ? ? ? ? ? ? ? ? ? ? ? ? PT Long Term Goals - 08/08/21 1204   ? ?  ? PT LONG TERM GOAL #1  ? Title Pt will be independent with her HEP   ? Time 6   ? Period Weeks   ? Status New   ? Target Date 09/19/21   ?  ? PT LONG TERM GOAL #

## 2021-09-05 ENCOUNTER — Encounter: Payer: Self-pay | Admitting: Physician Assistant

## 2021-09-05 ENCOUNTER — Encounter: Payer: Medicare Other | Admitting: Physical Therapy

## 2021-09-05 DIAGNOSIS — K279 Peptic ulcer, site unspecified, unspecified as acute or chronic, without hemorrhage or perforation: Secondary | ICD-10-CM | POA: Diagnosis not present

## 2021-09-05 DIAGNOSIS — K3189 Other diseases of stomach and duodenum: Secondary | ICD-10-CM | POA: Diagnosis not present

## 2021-09-06 ENCOUNTER — Ambulatory Visit: Payer: Medicare Other | Admitting: Physical Therapy

## 2021-09-06 ENCOUNTER — Other Ambulatory Visit: Payer: Self-pay

## 2021-09-06 DIAGNOSIS — R2689 Other abnormalities of gait and mobility: Secondary | ICD-10-CM | POA: Diagnosis not present

## 2021-09-06 DIAGNOSIS — M6281 Muscle weakness (generalized): Secondary | ICD-10-CM

## 2021-09-06 DIAGNOSIS — M25652 Stiffness of left hip, not elsewhere classified: Secondary | ICD-10-CM | POA: Diagnosis not present

## 2021-09-06 DIAGNOSIS — R252 Cramp and spasm: Secondary | ICD-10-CM

## 2021-09-06 DIAGNOSIS — M25552 Pain in left hip: Secondary | ICD-10-CM | POA: Diagnosis not present

## 2021-09-06 NOTE — Therapy (Signed)
[place] [facility] ?[address] [place] [address] ?[place], [place], [address] ?Phone: [phone]   Fax:  [phone] ? ?Physical Therapy Treatment ? ?Patient Details  ?Name: Judy Russell ?MRN: [id number] ?Date of Birth: Feb 11, 1956 ?Referring Provider (PT): Monica Becton, MD ? ? ?Encounter Date: 09/06/2021 ? ? PT End of Session - 09/06/21 0802   ? ? Visit Number 8   ? Number of Visits 12   ? Date for PT Re-Evaluation 09/19/21   ? Authorization Type UHC Medicare   ? PT Start Time 0802   ? PT Stop Time 0845   ? PT Time Calculation (min) 43 min   ? Activity Tolerance Patient tolerated treatment well   ? Behavior During Therapy [facility] for tasks assessed/performed   ? ?  ?  ? ?  ? ? ?Past Medical History:  ?Diagnosis Date  ? Allergy   ? Fibromyalgia   ? HSV-2 (herpes simplex virus 2) infection   ? Seizures (HCC)   ? Thyroid disease   ? ? ?Past Surgical History:  ?Procedure Laterality Date  ? APPENDECTOMY    ? DILATION AND CURETTAGE OF UTERUS    ? OOPHORECTOMY Left   ? ? ?There were no vitals filed for this visit. ? ? Subjective Assessment - 09/06/21 0803   ? ? Subjective Pt states she has been able to increase using ellipitical time to 10 minutes. Pt notes she "locked up" again yesterday and this morning. Woke up in a slightly supine position.   ? Limitations Walking;House hold activities;Sitting   ? How long can you sit comfortably? Has to get up and walk around   ? How long can you stand comfortably? During the day she's okay   ? How long can you walk comfortably? ~1/2 mile   ? Patient Stated Goals Get back to running   ? ?  ?  ? ?  ? ? ? ? ? [facility] PT Assessment - 09/06/21 0001   ? ?  ? Assessment  ? 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 Referring Provider (PT) Monica Becton, MD   ? ?  ?  ? ?  ? ? ? ? ? ? ? ? ? ? ? ? ? ? ? ? [facility] Adult PT Treatment/Exercise - 09/06/21 0001   ? ?  ? Knee/Hip Exercises: Stretches  ? Other Knee/Hip  Stretches sidelying QL stretch x30 sec   ?  ? Knee/Hip Exercises: Supine  ? Other Supine Knee/Hip Exercises PPT + bent knee raise iso 2x10 sec   ? Other Supine Knee/Hip Exercises PPT with feet on pball knee bent to knee ext 2x10   ?  ? Knee/Hip Exercises: Sidelying  ? Other Sidelying Knee/Hip Exercises knee tap forward in reverse clam, toe tap back clam 2x10   ?  ? Knee/Hip Exercises: Prone  ? Other Prone Exercises quadruped: alternating leg extensions x10 each side, fire hydrant x10 each side   ?  ? Manual Therapy  ? Manual therapy comments L inominate A/P rotation in sidelying mobilization   ? Soft tissue mobilization STM & TPR L upper glute   ? ?  ?  ? ?  ? ? ? ? ? ? ? ? ? ? ? ? ? ? ? PT Long Term Goals - 08/08/21 1204   ? ?  ? PT LONG TERM GOAL #1  ? Title Pt will be independent with her HEP   ? Time 6   ?  Period Weeks   ? Status New   ? Target Date 09/19/21   ?  ? PT LONG TERM GOAL #2  ? Title Pt will demo L = R hip ROM   ? Baseline L SLR ~60 deg, R SLR ~80 deg   ? Time 6   ? Period Weeks   ? Status New   ? Target Date 09/19/21   ?  ? PT LONG TERM GOAL #3  ? Title Pt will report at least 50% decrease in her pain   ? Time 6   ? Period Weeks   ? Status New   ? Target Date 09/19/21   ?  ? PT LONG TERM GOAL #4  ? Title Pt will be able to return to running   ? Time 6   ? Period Weeks   ? Status New   ? Target Date 09/19/21   ?  ? PT LONG TERM GOAL #5  ? Title Pt will improve FOTO score to predicted level   ? Time 6   ? Period Weeks   ? Status New   ? Target Date 09/19/21   ? ?  ?  ? ?  ? ? ? ? ? ? ? ? Plan - 09/06/21 0833   ? ? Clinical Impression Statement Treatment session focused on controlling PPT in supine -- pt with greater difficulty maintaining. 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 Manual provided to address tightness she feels along upper glutes/sacrum.   ? Personal Factors and Comorbidities Age;Fitness;Time since onset of injury/illness/exacerbation   ? Examination-Activity Limitations Locomotion  Level;Transfers;Bed Mobility;Stand   ? Examination-Participation Restrictions Community Activity   ? Stability/Clinical Decision Making Evolving/Moderate complexity   ? Rehab Potential Good   ? PT Frequency Other (comment)   1-2x/wk per pt's schedule  ? PT Duration 6 weeks   ? PT Treatment/Interventions ADLs/Self Care Home Management;Aquatic Therapy;Electrical Stimulation;Cryotherapy;Moist Heat;DME Instruction;Ultrasound;Iontophoresis 4mg /ml Dexamethasone;Gait training;Stair training;Functional mobility training;Therapeutic activities;Therapeutic exercise;Balance training;Neuromuscular re-education;Manual techniques;Patient/family education;Dry needling;Taping;Joint Manipulations;Spinal Manipulations;Passive range of motion   ? PT Next Visit Plan How was HEP? Continue hip stretching and strengthening. Initiate exercises in standing and core strengthening. Manual work/TPDN as needed through C and T spine.   ? PT Home Exercise Plan Access Code: [email or web address]   ? Consulted and Agree with Plan of Care Patient   ? ?  ?  ? ?  ? ? ?Patient will benefit from skilled therapeutic intervention in order to improve the following deficits and impairments:  Decreased range of motion, Increased fascial restricitons, Difficulty walking, Increased muscle spasms, Decreased activity tolerance, Pain, Hypomobility, Impaired flexibility, Decreased mobility, Decreased strength, Postural dysfunction ? ?Visit Diagnosis: ?Cramp and spasm ? ?Muscle weakness (generalized) ? ?Other abnormalities of gait and mobility ? ?Pain in left hip ? ?Stiffness of left hip, not elsewhere classified ? ? ? ? ?Problem List ?Patient Active Problem List  ? Diagnosis Date Noted  ? 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 Fall 07/26/2021  ? Acute bilateral low back pain without sciatica 07/25/2021  ? Syncope 05/31/2021  ? Acute gastritis without hemorrhage 05/31/2021  ? Hamstring strain, left, initial encounter 04/28/2021  ? Bipolar 1 disorder, mixed (HCC)  03/21/2021  ? No energy 02/28/2021  ? Lack of motivation 02/28/2021  ? Rectocele 12/06/2020  ? Toenail fungus 09/14/2020  ? Corn of toe 09/14/2020  ? Tapeworm 04/02/2020  ? Unintended weight loss 02/18/2020  ? Loss of taste 02/18/2020  ? Scalp itch 01/12/2020  ? Allergic reaction 01/09/2020  ?  Mold exposure 12/24/2019  ? Chest tightness 12/24/2019  ? History of COVID-19 10/07/2019  ? COVID-19 virus infection 09/22/2019  ? Suspected COVID-19 virus infection 09/11/2019  ? Seborrheic keratoses 05/14/2019  ? Food allergy 01/29/2019  ? 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  ? Chronic constipation 10/30/2018  ? Cervical radiculopathy 08/21/2018  ? Tonsillith 07/30/2018  ? Callus of foot 07/29/2018  ? Multiple food allergies 03/25/2018  ? Environmental allergies 03/25/2018  ? CFS (chronic fatigue syndrome) 11/27/2017  ? Perennial allergic rhinitis 08/13/2017  ? 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  ? Memory changes 07/19/2017  ? Hyperlipidemia 07/11/2017  ? Elevated blood pressure reading 07/11/2017  ? History of colon resection 07/09/2017  ? History of irregular heartbeat 07/09/2017  ? Sinus bradycardia by electrocardiogram 07/09/2017  ? Left leg pain 03/18/2017  ? Right foot pain 03/18/2017  ? Menopausal symptoms 07/21/2016  ? Mood disorder in conditions classified elsewhere 07/21/2016  ? Plantar wart of left foot 02/29/2016  ? Dyspareunia in female 07/07/2015  ? Herpes genitalis in women 07/07/2015  ? Fibromyalgia 05/05/2015  ? Primary osteoarthritis involving multiple joints 05/05/2015  ? Thyroid activity decreased 05/05/2015  ? Chronic neck pain 05/05/2015  ? Hypothyroidism 09/04/2009  ? SEIZURE DISORDER 09/04/2009  ? ? ?Adriene Knipfer April Dell [place], PT, DPT ?09/06/2021, 8:43 AM ? ?[place] [facility] ?[address] [place] [address] ?[place], [place], [address] ?Phone: [phone]   Fax:  [phone] ? ?Name: Judy Russell ?MRN: [id number] ?Date of Birth: [birth year] ? ? ? ?

## 2021-09-08 ENCOUNTER — Other Ambulatory Visit: Payer: Self-pay

## 2021-09-08 ENCOUNTER — Ambulatory Visit: Payer: Medicare Other | Admitting: Physical Therapy

## 2021-09-08 DIAGNOSIS — R252 Cramp and spasm: Secondary | ICD-10-CM

## 2021-09-08 DIAGNOSIS — R2689 Other abnormalities of gait and mobility: Secondary | ICD-10-CM

## 2021-09-08 DIAGNOSIS — M25552 Pain in left hip: Secondary | ICD-10-CM | POA: Diagnosis not present

## 2021-09-08 DIAGNOSIS — M6281 Muscle weakness (generalized): Secondary | ICD-10-CM

## 2021-09-08 DIAGNOSIS — M25652 Stiffness of left hip, not elsewhere classified: Secondary | ICD-10-CM | POA: Diagnosis not present

## 2021-09-08 NOTE — Therapy (Signed)
[facility] [address] [place], [place], [address] Phone: [phone]   Fax:  [phone]  Physical Therapy Treatment  Patient Details  Name: Judy Russell MRN: [id number] Date of Birth: 12/19/1955 Referring Provider (PT): Monica Becton, MD   Encounter Date: 09/08/2021   PT End of Session - 09/08/21 0801     Visit Number 9    Number of Visits 12    Date for PT Re-Evaluation 09/19/21    Authorization Type UHC Medicare    PT Start Time 0801    PT Stop Time 0845    PT Time Calculation (min) 44 min    Activity Tolerance Patient tolerated treatment well    Behavior During Therapy [facility] for tasks assessed/performed             Past Medical History:  Diagnosis Date   Allergy    Fibromyalgia    HSV-2 (herpes simplex virus 2) infection    Seizures (HCC)    Thyroid disease     Past Surgical History:  Procedure Laterality Date   APPENDECTOMY     DILATION AND CURETTAGE OF UTERUS     OOPHORECTOMY Left     There were no vitals filed for this visit.   Subjective Assessment - 09/08/21 0803     Subjective Pt states she slept on her side as discussed from previous session. Pt states she did exercises before going to bed. She once again woke up on her back and felt locked up.    Limitations Walking;House hold activities;Sitting    How long can you sit comfortably? Has to get up and walk around    How long can you stand comfortably? During the day she's okay    How long can you walk comfortably? ~1/2 mile    Patient Stated Goals Get back to running    Currently in Pain? 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    Referring Provider (PT) Monica Becton, MD                           [facility] Adult PT Treatment/Exercise - 09/08/21 0001       Knee/Hip  Exercises: Stretches   Other Knee/Hip Stretches sidelying QL stretch x30 sec      Knee/Hip Exercises: Aerobic   Tread Mill 2.22mph x 5 min      Knee/Hip Exercises: Standing   Wall Squat 10 reps    Wall Squat Limitations with PPT    Other Standing Knee Exercises hip hike x10      Knee/Hip Exercises: Supine   Other Supine Knee/Hip Exercises PPT with feet on pball knee bent to knee ext 2x10; hamstring iso with feet on pball 10x3 sec      Knee/Hip Exercises: Sidelying   Other Sidelying Knee/Hip Exercises knee tap forward in reverse clam, toe tap back clam 2x10      Manual Therapy   Soft tissue mobilization STM & TPR L QL and upper glute              Trigger Point Dry Needling - 09/08/21 0001     Consent Given? Yes    Education Handout Provided Previously provided    Muscles Treated Back/Hip Quadratus lumborum  Lumbar multifidi Response Palpable increased muscle length;Twitch response elicited    Quadratus Lumborum Response Twitch response elicited;Palpable increased muscle length                        PT Long Term Goals - 08/08/21 1204       PT LONG TERM GOAL #1   Title Pt will be independent with her HEP    Time 6    Period Weeks    Status New    Target Date 09/19/21      PT LONG TERM GOAL #2   Title Pt will demo L = R hip ROM    Baseline L SLR ~60 deg, R SLR ~80 deg    Time 6    Period Weeks    Status New    Target Date 09/19/21      PT LONG TERM GOAL #3   Title Pt will report at least 50% decrease in her pain    Time 6    Period Weeks    Status New    Target Date 09/19/21      PT LONG TERM GOAL #4   Title Pt will be able to return to running    Time 6    Period Weeks    Status New    Target Date 09/19/21      PT LONG TERM GOAL #5   Title Pt will improve FOTO score to predicted level    Time 6    Period Weeks    Status New    Target Date 09/19/21                   Plan - 09/08/21 0831     Clinical Impression  Statement Increased tightness in L QL this AM -- provided manual and TPDN to address. Continued to work on maintaining PPT, core strengthening and glute/hip strengthening. Pt is demonstrating improving pelvic control; however, will still wake up in the middle of the night with increased muscle stiffness and spasm.    Personal Factors and Comorbidities Age;Fitness;Time since onset of injury/illness/exacerbation    Examination-Activity Limitations Locomotion Level;Transfers;Bed Mobility;Stand    Examination-Participation Restrictions Community Activity    Stability/Clinical Decision Making Evolving/Moderate complexity    Rehab Potential Good    PT Frequency Other (comment)   1-2x/wk per pt's schedule   PT Duration 6 weeks    PT Treatment/Interventions ADLs/Self Care Home Management;Aquatic Therapy;Electrical Stimulation;Cryotherapy;Moist Heat;DME Instruction;Ultrasound;Iontophoresis 4mg /ml Dexamethasone;Gait training;Stair training;Functional mobility training;Therapeutic activities;Therapeutic exercise;Balance training;Neuromuscular re-education;Manual techniques;Patient/family education;Dry needling;Taping;Joint Manipulations;Spinal Manipulations;Passive range of motion    PT Next Visit Plan How was HEP? Continue hip stretching and strengthening. Initiate exercises in standing and core strengthening. Manual work/TPDN as needed through C and T spine.    PT Home Exercise Plan Access Code: [email or web address]    Consulted and Agree with Plan of Care Patient             Patient will benefit from skilled therapeutic intervention in order to improve the following deficits and impairments:  Decreased range of motion, Increased fascial restricitons, Difficulty walking, Increased muscle spasms, Decreased activity tolerance, Pain, Hypomobility, Impaired flexibility, Decreased mobility, Decreased strength, Postural dysfunction  Visit Diagnosis: Cramp and spasm  Muscle weakness (generalized)  Other abnormalities  of gait and mobility     Problem List Patient Active Problem List   Diagnosis Date Noted   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 07/27/2021   Fall 07/26/2021   Acute bilateral  low back pain without sciatica 07/25/2021   Syncope 05/31/2021   Acute gastritis without hemorrhage 05/31/2021   Hamstring strain, left, initial encounter 04/28/2021   Bipolar 1 disorder, mixed (HCC) 03/21/2021   No energy 02/28/2021   Lack of motivation 02/28/2021   Rectocele 12/06/2020   Toenail fungus 09/14/2020   Corn of toe 09/14/2020   Tapeworm 04/02/2020   Unintended weight loss 02/18/2020   Loss of taste 02/18/2020   Scalp itch 01/12/2020   Allergic reaction 01/09/2020   Mold exposure 12/24/2019   Chest tightness 12/24/2019   History of COVID-19 10/07/2019   COVID-19 virus infection 09/22/2019   Suspected COVID-19 virus infection 09/11/2019   Seborrheic keratoses 05/14/2019   Food allergy 01/29/2019   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   Chronic constipation 10/30/2018   Cervical radiculopathy 08/21/2018   Tonsillith 07/30/2018   Callus of foot 07/29/2018   Multiple food allergies 03/25/2018   Environmental allergies 03/25/2018   CFS (chronic fatigue syndrome) 11/27/2017   Perennial allergic rhinitis 08/13/2017   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   Memory changes 07/19/2017   Hyperlipidemia 07/11/2017   Elevated blood pressure reading 07/11/2017   History of colon resection 07/09/2017   History of irregular heartbeat 07/09/2017   Sinus bradycardia by electrocardiogram 07/09/2017   Left leg pain 03/18/2017   Right foot pain 03/18/2017   Menopausal symptoms 07/21/2016   Mood disorder in conditions classified elsewhere 07/21/2016   Plantar wart of left foot 02/29/2016   Dyspareunia in female 07/07/2015   Herpes genitalis in women 07/07/2015   Fibromyalgia 05/05/2015   Primary osteoarthritis involving multiple joints 05/05/2015   Thyroid activity decreased 05/05/2015   Chronic neck pain  05/05/2015   Hypothyroidism 09/04/2009   SEIZURE DISORDER 09/04/2009    [facility] April Ma L [place], PT, DPT 09/08/2021, 8:54 AM  [facility] [address] [place], [place], [address] Phone: [phone]   Fax:  [phone]  Name: Judy Russell MRN: [id number] Date of Birth: 03-Nov-1955

## 2021-09-09 ENCOUNTER — Ambulatory Visit: Payer: Medicare Other | Admitting: Physical Therapy

## 2021-09-12 ENCOUNTER — Other Ambulatory Visit: Payer: Self-pay

## 2021-09-12 ENCOUNTER — Ambulatory Visit: Payer: Medicare Other | Admitting: Physical Therapy

## 2021-09-12 DIAGNOSIS — R2689 Other abnormalities of gait and mobility: Secondary | ICD-10-CM

## 2021-09-12 DIAGNOSIS — M25652 Stiffness of left hip, not elsewhere classified: Secondary | ICD-10-CM

## 2021-09-12 DIAGNOSIS — M6281 Muscle weakness (generalized): Secondary | ICD-10-CM | POA: Diagnosis not present

## 2021-09-12 DIAGNOSIS — R252 Cramp and spasm: Secondary | ICD-10-CM | POA: Diagnosis not present

## 2021-09-12 DIAGNOSIS — M25552 Pain in left hip: Secondary | ICD-10-CM

## 2021-09-12 NOTE — Therapy (Signed)
[place] ?[facility] ?[address] [place] [address] ?[place], [place], [address] ?Phone: [phone]   Fax:  [phone] ? ?Physical Therapy Treatment ? ?Patient Details  ?Name: Judy Russell ?MRN: [id number] ?Date of Birth: 23-Oct-1955 ?Referring Provider (PT): Monica Becton, MD ? ? ?Encounter Date: 09/12/2021 ? ? PT End of Session - 09/12/21 1522   ? ? Visit Number 10   ? Number of Visits 12   ? Date for PT Re-Evaluation 09/19/21   ? Authorization Type U[employer] Medicare   ? PT Start Time 1520   ? PT Stop Time 1600   ? PT Time Calculation (min) 40 min   ? Activity Tolerance Patient tolerated treatment well   ? Behavior During Therapy [facility] for tasks assessed/performed   ? ?  ?  ? ?  ? ? ?Past Medical History:  ?Diagnosis Date  ? Allergy   ? Fibromyalgia   ? HSV-2 (herpes simplex virus 2) infection   ? Seizures (HCC)   ? Thyroid disease   ? ? ?Past Surgical History:  ?Procedure Laterality Date  ? APPENDECTOMY    ? DILATION AND CURETTAGE OF UTERUS    ? OOPHORECTOMY Left   ? ? ?There were no vitals filed for this visit. ? ? Subjective Assessment - 09/12/21 1523   ? ? Subjective Pt reports this past weekend she's locked up. She has some pain. Pt reports within an hour she gets better after some movement.   ? Limitations Walking;House hold activities;Sitting   ? How long can you sit comfortably? Has to get up and walk around   ? How long can you stand comfortably? During the day she's okay   ? How long can you walk comfortably? ~1/2 mile   ? Patient Stated Goals Get back to running   ? Currently in Pain? No/denies   ? ?  ?  ? ?  ? ? ? ? ? [facility] PT Assessment - 09/12/21 0001   ? ?  ? Assessment  ? 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 Referring Provider (PT) Monica Becton, MD   ? ?  ?  ? ?  ? ? ? ? ? ? ? ? ? ? ? ? ? ? ? ? [facility] Adult PT Treatment/Exercise - 09/12/21 0001   ? ?  ? Knee/Hip Exercises: Stretches  ? Passive Hamstring Stretch  Right;Left;30 seconds   ? Passive Hamstring Stretch Limitations supine with strap   ? Piriformis Stretch Right;Left;30 seconds   ? Other Knee/Hip Stretches Standing QL hip stretch into wall 2x30 sec   ? Other Knee/Hip Stretches Lat stretch on wall x30 sec standing; lat stretch with feet on wall in sup x30 sec each side   ?  ? Knee/Hip Exercises: Standing  ? Wall Squat 10 reps   ? Wall Squat Limitations with PPT   ? Other Standing Knee Exercises hip hike x10   ? Other Standing Knee Exercises lateral band walk green tband 2x10; shoulder extension green tband 2x10; diagonal chops with red medicine ball x10   ?  ? Knee/Hip Exercises: Sidelying  ? Other Sidelying Knee/Hip Exercises knee tap forward in reverse clam, toe tap back clam 2x10   ? ?  ?  ? ?  ? ? ? ? ? ? ? ? ? ? ? ? ? ? ? PT Long Term Goals - 08/08/21 1204   ? ?  ? PT LONG TERM GOAL #1  ? Title  Pt will be independent with her HEP   ? Time 6   ? Period Weeks   ? Status New   ? Target Date 09/19/21   ?  ? PT LONG TERM GOAL #2  ? Title Pt will demo L = R hip ROM   ? Baseline L SLR ~60 deg, R SLR ~80 deg   ? Time 6   ? Period Weeks   ? Status New   ? Target Date 09/19/21   ?  ? PT LONG TERM GOAL #3  ? Title Pt will report at least 50% decrease in her pain   ? Time 6   ? Period Weeks   ? Status New   ? Target Date 09/19/21   ?  ? PT LONG TERM GOAL #4  ? Title Pt will be able to return to running   ? Time 6   ? Period Weeks   ? Status New   ? Target Date 09/19/21   ?  ? PT LONG TERM GOAL #5  ? Title Pt will improve FOTO score to predicted level   ? Time 6   ? Period Weeks   ? Status New   ? Target Date 09/19/21   ? ?  ?  ? ?  ? ? ? ? ? ? ? ? ? ?Patient will benefit from skilled therapeutic intervention in order to improve the following deficits and impairments:    ? ?Visit Diagnosis: ?Cramp and spasm ? ?Muscle weakness (generalized) ? ?Other abnormalities of gait and mobility ? ?Pain in left hip ? ?Stiffness of left hip, not elsewhere classified ? ? ? ? ?Problem  List ?Patient Active Problem List  ? Diagnosis Date Noted  ? 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 Fall 07/26/2021  ? Acute bilateral low back pain without sciatica 07/25/2021  ? Syncope 05/31/2021  ? Acute gastritis without hemorrhage 05/31/2021  ? Hamstring strain, left, initial encounter 04/28/2021  ? Bipolar 1 disorder, mixed (HCC) 03/21/2021  ? No energy 02/28/2021  ? Lack of motivation 02/28/2021  ? Rectocele 12/06/2020  ? Toenail fungus 09/14/2020  ? Corn of toe 09/14/2020  ? Tapeworm 04/02/2020  ? Unintended weight loss 02/18/2020  ? Loss of taste 02/18/2020  ? Scalp itch 01/12/2020  ? Allergic reaction 01/09/2020  ? Mold exposure 12/24/2019  ? Chest tightness 12/24/2019  ? History of COVID-19 10/07/2019  ? COVID-19 virus infection 09/22/2019  ? Suspected COVID-19 virus infection 09/11/2019  ? Seborrheic keratoses 05/14/2019  ? Food allergy 01/29/2019  ? 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  ? Chronic constipation 10/30/2018  ? Cervical radiculopathy 08/21/2018  ? Tonsillith 07/30/2018  ? Callus of foot 07/29/2018  ? Multiple food allergies 03/25/2018  ? Environmental allergies 03/25/2018  ? CFS (chronic fatigue syndrome) 11/27/2017  ? Perennial allergic rhinitis 08/13/2017  ? 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  ? Memory changes 07/19/2017  ? Hyperlipidemia 07/11/2017  ? Elevated blood pressure reading 07/11/2017  ? History of colon resection 07/09/2017  ? History of irregular heartbeat 07/09/2017  ? Sinus bradycardia by electrocardiogram 07/09/2017  ? Left leg pain 03/18/2017  ? Right foot pain 03/18/2017  ? Menopausal symptoms 07/21/2016  ? Mood disorder in conditions classified elsewhere 07/21/2016  ? Plantar wart of left foot 02/29/2016  ? Dyspareunia in female 07/07/2015  ? Herpes genitalis in women 07/07/2015  ? Fibromyalgia 05/05/2015  ? Primary osteoarthritis involving multiple joints 05/05/2015  ? Thyroid activity decreased 05/05/2015  ? Chronic neck pain 05/05/2015  ? Hypothyroidism  09/04/2009   ? SEIZURE DISORDER 09/04/2009  ? ? ?Latiffany Harwick April Dell Ponto, PT, DPT ?09/12/2021, 4:37 PM ? ?[place] ?[facility] ?[address] [place] [address] ?[place], [place], [address] ?Phone: [phone]   Fax:  [phone] ? ?Name: Judy Russell ?MRN: [id number] ?Date of Birth: 05/10/1956 ? ? ? ?

## 2021-09-15 ENCOUNTER — Other Ambulatory Visit: Payer: Self-pay

## 2021-09-15 ENCOUNTER — Ambulatory Visit: Payer: Medicare Other | Admitting: Physical Therapy

## 2021-09-15 DIAGNOSIS — R252 Cramp and spasm: Secondary | ICD-10-CM | POA: Diagnosis not present

## 2021-09-15 DIAGNOSIS — R2689 Other abnormalities of gait and mobility: Secondary | ICD-10-CM | POA: Diagnosis not present

## 2021-09-15 DIAGNOSIS — M25652 Stiffness of left hip, not elsewhere classified: Secondary | ICD-10-CM | POA: Diagnosis not present

## 2021-09-15 DIAGNOSIS — M25552 Pain in left hip: Secondary | ICD-10-CM | POA: Diagnosis not present

## 2021-09-15 DIAGNOSIS — M6281 Muscle weakness (generalized): Secondary | ICD-10-CM | POA: Diagnosis not present

## 2021-09-15 NOTE — Therapy (Signed)
of toe 09/14/2020  ? Tapeworm 04/02/2020  ? Unintended weight loss 02/18/2020  ? Loss of taste 02/18/2020  ? Scalp itch 01/12/2020  ? Allergic reaction 01/09/2020  ? Mold exposure 12/24/2019  ? Chest tightness 12/24/2019  ? History of COVID-19 10/07/2019  ? COVID-19 virus infection 09/22/2019  ? Suspected COVID-19 virus infection 09/11/2019  ? Seborrheic keratoses 05/14/2019  ? Food allergy 01/29/2019  ? 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  ? Chronic constipation 10/30/2018  ? Cervical radiculopathy 08/21/2018  ? Tonsillith 07/30/2018  ? Callus of foot 07/29/2018  ? Multiple food allergies 03/25/2018  ? Environmental allergies 03/25/2018  ? CFS (chronic fatigue syndrome) 11/27/2017  ? Perennial allergic rhinitis 08/13/2017  ? 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  ? Memory changes 07/19/2017  ? Hyperlipidemia 07/11/2017  ? Elevated blood pressure reading 07/11/2017  ? History of colon resection 07/09/2017  ? History of irregular heartbeat 07/09/2017  ? Sinus bradycardia by electrocardiogram 07/09/2017  ? Left leg pain 03/18/2017  ? Right foot pain 03/18/2017  ? Menopausal symptoms 07/21/2016  ? Mood disorder in conditions classified elsewhere 07/21/2016  ? Plantar wart of left foot 02/29/2016  ? Dyspareunia in female 07/07/2015  ? Herpes genitalis in women 07/07/2015  ? Fibromyalgia 05/05/2015  ? Primary osteoarthritis involving multiple joints 05/05/2015  ? Thyroid activity decreased 05/05/2015  ? Chronic neck pain 05/05/2015  ? Hypothyroidism 09/04/2009  ? SEIZURE DISORDER 09/04/2009  ? ? ? April Gordy Levan, PT, DPT ?09/15/2021, 8:47 AM ? ?[place] ?[facility] ?[address] ?[place], [place], [address] ?Phone: [phone]   Fax:  [phone] ? ?Name: Judy Russell ?MRN: [id number] ?Date of Birth: [birth year] ? ? ? ?  of toe 09/14/2020  ? Tapeworm 04/02/2020  ? Unintended weight loss 02/18/2020  ? Loss of taste 02/18/2020  ? Scalp itch 01/12/2020  ? Allergic reaction 01/09/2020  ? Mold exposure 12/24/2019  ? Chest tightness 12/24/2019  ? History of COVID-19 10/07/2019  ? COVID-19 virus infection 09/22/2019  ? Suspected COVID-19 virus infection 09/11/2019  ? Seborrheic keratoses 05/14/2019  ? Food allergy 01/29/2019  ? 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  ? Chronic constipation 10/30/2018  ? Cervical radiculopathy 08/21/2018  ? Tonsillith 07/30/2018  ? Callus of foot 07/29/2018  ? Multiple food allergies 03/25/2018  ? Environmental allergies 03/25/2018  ? CFS (chronic fatigue syndrome) 11/27/2017  ? Perennial allergic rhinitis 08/13/2017  ? 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  ? Memory changes 07/19/2017  ? Hyperlipidemia 07/11/2017  ? Elevated blood pressure reading 07/11/2017  ? History of colon resection 07/09/2017  ? History of irregular heartbeat 07/09/2017  ? Sinus bradycardia by electrocardiogram 07/09/2017  ? Left leg pain 03/18/2017  ? Right foot pain 03/18/2017  ? Menopausal symptoms 07/21/2016  ? Mood disorder in conditions classified elsewhere 07/21/2016  ? Plantar wart of left foot 02/29/2016  ? Dyspareunia in female 07/07/2015  ? Herpes genitalis in women 07/07/2015  ? Fibromyalgia 05/05/2015  ? Primary osteoarthritis involving multiple joints 05/05/2015  ? Thyroid activity decreased 05/05/2015  ? Chronic neck pain 05/05/2015  ? Hypothyroidism 09/04/2009  ? SEIZURE DISORDER 09/04/2009  ? ? ? April Gordy Levan, PT, DPT ?09/15/2021, 8:47 AM ? ?[place] ?[facility] ?[address] ?[place], [place], [address] ?Phone: [phone]   Fax:  [phone] ? ?Name: Judy Russell ?MRN: [id number] ?Date of Birth: [birth year] ? ? ? ?  of toe 09/14/2020  ? Tapeworm 04/02/2020  ? Unintended weight loss 02/18/2020  ? Loss of taste 02/18/2020  ? Scalp itch 01/12/2020  ? Allergic reaction 01/09/2020  ? Mold exposure 12/24/2019  ? Chest tightness 12/24/2019  ? History of COVID-19 10/07/2019  ? COVID-19 virus infection 09/22/2019  ? Suspected COVID-19 virus infection 09/11/2019  ? Seborrheic keratoses 05/14/2019  ? Food allergy 01/29/2019  ? 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  ? Chronic constipation 10/30/2018  ? Cervical radiculopathy 08/21/2018  ? Tonsillith 07/30/2018  ? Callus of foot 07/29/2018  ? Multiple food allergies 03/25/2018  ? Environmental allergies 03/25/2018  ? CFS (chronic fatigue syndrome) 11/27/2017  ? Perennial allergic rhinitis 08/13/2017  ? 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  ? Memory changes 07/19/2017  ? Hyperlipidemia 07/11/2017  ? Elevated blood pressure reading 07/11/2017  ? History of colon resection 07/09/2017  ? History of irregular heartbeat 07/09/2017  ? Sinus bradycardia by electrocardiogram 07/09/2017  ? Left leg pain 03/18/2017  ? Right foot pain 03/18/2017  ? Menopausal symptoms 07/21/2016  ? Mood disorder in conditions classified elsewhere 07/21/2016  ? Plantar wart of left foot 02/29/2016  ? Dyspareunia in female 07/07/2015  ? Herpes genitalis in women 07/07/2015  ? Fibromyalgia 05/05/2015  ? Primary osteoarthritis involving multiple joints 05/05/2015  ? Thyroid activity decreased 05/05/2015  ? Chronic neck pain 05/05/2015  ? Hypothyroidism 09/04/2009  ? SEIZURE DISORDER 09/04/2009  ? ? ? April Gordy Levan, PT, DPT ?09/15/2021, 8:47 AM ? ?[place] ?[facility] ?[address] ?[place], [place], [address] ?Phone: [phone]   Fax:  [phone] ? ?Name: Judy Russell ?MRN: [id number] ?Date of Birth: [birth year] ? ? ? ?

## 2021-09-19 ENCOUNTER — Other Ambulatory Visit: Payer: Self-pay

## 2021-09-19 ENCOUNTER — Ambulatory Visit: Payer: Medicare Other | Admitting: Physical Therapy

## 2021-09-19 DIAGNOSIS — M25652 Stiffness of left hip, not elsewhere classified: Secondary | ICD-10-CM

## 2021-09-19 DIAGNOSIS — M6281 Muscle weakness (generalized): Secondary | ICD-10-CM

## 2021-09-19 DIAGNOSIS — R2689 Other abnormalities of gait and mobility: Secondary | ICD-10-CM | POA: Diagnosis not present

## 2021-09-19 DIAGNOSIS — R252 Cramp and spasm: Secondary | ICD-10-CM | POA: Diagnosis not present

## 2021-09-19 DIAGNOSIS — M25552 Pain in left hip: Secondary | ICD-10-CM

## 2021-09-19 NOTE — Therapy (Signed)
[place] ?[facility] ?[address] ?[place], [place], [address] ?Phone: [phone]   Fax:  [phone] ? ?Physical Therapy Treatment and Discharge ? ?Patient Details  ?Name: Judy Russell ?MRN: [id number] ?Date of Birth: 12-Sep-1955 ?Referring Provider (PT): Silverio Decamp, MD ? ?PHYSICAL THERAPY DISCHARGE SUMMARY ? ?Visits from Start of Care: 12 ? ?Current functional level related to goals / functional outcomes: ?See below ?  ?Remaining deficits: ?Hamstring lengths are near equal L and R; however, continued L hip tightness in ER. Continued tightness in the AM.  ?  ?Education / Equipment: ?See below. Discussed progressing HEP  ? ?Patient agrees to discharge. Patient goals were partially met. Patient is being discharged due to meeting the stated rehab goals. ? ? ?Encounter Date: 09/19/2021 ? ? PT End of Session - 09/19/21 1437   ? ? Visit Number 12   ? Number of Visits 12   ? Date for PT Re-Evaluation 09/19/21   ? Authorization Type UHC Medicare   ? PT Start Time 1430   ? PT Stop Time 1515   ? PT Time Calculation (min) 45 min   ? Activity Tolerance Patient tolerated treatment well   ? Behavior During Therapy [facility] for tasks assessed/performed   ? ?  ?  ? ?  ? ? ?Past Medical History:  ?Diagnosis Date  ? Allergy   ? Fibromyalgia   ? HSV-2 (herpes simplex virus 2) infection   ? Seizures ([place])   ? Thyroid disease   ? ? ?Past Surgical History:  ?Procedure Laterality Date  ? APPENDECTOMY    ? DILATION AND CURETTAGE OF UTERUS    ? OOPHORECTOMY Left   ? ? ?There were no vitals filed for this visit. ? ? Subjective Assessment - 09/19/21 1437   ? ? Subjective Pt reports she's been able to go back to jogging short distances. Pt notes she's back to all of her normal activity but in the mornings she remains very stiff. She has been good at performing her exercises throughout the job.   ? Limitations Walking;House hold activities;Sitting   ? How long can you sit comfortably? Has  to get up and walk around   ? How long can you stand comfortably? During the day she's okay   ? How long can you walk comfortably? ~1/2 mile   ? Patient Stated Goals Get back to running   ? Currently in Pain? No/denies   ? ?  ?  ? ?  ? ? ? ? ? [facility] PT Assessment - 09/19/21 0001   ? ?  ? Assessment  ? 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 Referring Provider (PT) Silverio Decamp, MD   ? ?  ?  ? ?  ? ? ? ? ? ? ? ? ? ? ? ? ? ? ? ? [employer] Adult PT Treatment/Exercise - 09/19/21 0001   ? ?  ? Knee/Hip Exercises: Stretches  ? Passive Hamstring Stretch Right;Left;30 seconds   ? Passive Hamstring Stretch Limitations seated   ? Piriformis Stretch Right;Left;30 seconds   ? Other Knee/Hip Stretches figure 4 stretch on bed x30 sec   ?  ? Knee/Hip Exercises: Aerobic  ? T[place] 1.8 mph x 5 min; 2.8 mph x 10 min   ?  ? Knee/Hip Exercises: Standing  ? Other Standing Knee Exercises bow and arrow blue tband x10   ?  ? Knee/Hip Exercises: Seated  ? Sit to [employer]  2 sets;10 reps   with 10# KB  ?  ? Knee/Hip Exercises: Prone  ? Other Prone Exercises quadruped: fire hydrant red tband x10 each side, leg extensions red tband x10   ? ?  ?  ? ?  ? ? ? ? ? ? ? ? ? ? ? ? ? ? ? PT Long Term Goals - 09/19/21 1439   ? ?  ? PT LONG TERM GOAL #1  ? Title Pt will be independent with her HEP   ? Time 6   ? Period Weeks   ? Status Achieved   ? Target Date 09/19/21   ?  ? PT LONG TERM GOAL #2  ? Title Pt will demo L = R hip ROM   ? Baseline L SLR ~60 deg, R SLR ~80 deg   ? Time 6   ? Period Weeks   ? Status Achieved   ? Target Date 09/19/21   ?  ? PT LONG TERM GOAL #3  ? Title Pt will report at least 50% decrease in her pain   ? Baseline Other than the morning pt reports she has felt 0 to 3/10 pain at most with more intense activities such as running   ? Time 6   ? Period Weeks   ? Status Achieved   ? Target Date 09/19/21   ?  ? PT LONG TERM GOAL #4  ? Title Pt will be able to return to running   ? Time  6   ? Period Weeks   ? Status Achieved   ? Target Date 09/19/21   ?  ? PT LONG TERM GOAL #5  ? Title Pt will improve FOTO score to predicted level   ? Time 6   ? Period Weeks   ? Status Deferred   ? Target Date 09/19/21   ? ?  ?  ? ?  ? ? ? ? ? ? ? ? Plan - 09/19/21 1510   ? ? Clinical Impression Statement Session focused on preparing pt for d/c. Pt has returned to some jogging with little to no pain. Only issue has remained with her morning stiffness. Has some continued L>R hip stiffness in ER; however, hamstring length has improved. Worked on providing pt advanced HEP to work at home. At this time she has met or partially met her LTGs and feels ready for d/c.   ? Personal Factors and Comorbidities Age;Fitness;Time since onset of injury/illness/exacerbation   ? Examination-Activity Limitations Locomotion Level;Transfers;Bed Mobility;Stand   ? Examination-Participation Restrictions Community Activity   ? Stability/Clinical Decision Making Evolving/Moderate complexity   ? Rehab Potential Good   ? PT Frequency Other (comment)   1-2x/wk per pt's schedule  ? PT Duration 6 weeks   ? PT Treatment/Interventions ADLs/Self Care Home Management;Aquatic Therapy;Electrical Stimulation;Cryotherapy;Moist Heat;DME Instruction;Ultrasound;Iontophoresis 48m/ml Dexamethasone;Gait training;Stair training;Functional mobility training;Therapeutic activities;Therapeutic exercise;Balance training;Neuromuscular re-education;Manual techniques;Patient/family education;Dry needling;Taping;Joint Manipulations;Spinal Manipulations;Passive range of motion   ? PT Next Visit Plan How was HEP. Continue hip stretching and strengthening. Initiate exercises in standing and core strengthening. Manual work/TPDN as needed through [facility].   ? PT Home Exercise Plan Access Code: [email or web address]   ? Consulted and Agree with Plan of Care Patient   ? ?  ?  ? ?  ? ? ?Patient will benefit from skilled therapeutic intervention in order to improve the following  deficits and impairments:  Decreased range of motion, Increased fascial restricitons, Difficulty walking, Increased muscle spasms, Decreased activity tolerance, Pain, Hypomobility,  Impaired flexibility, Decreased mobility, Decreased strength, Postural dysfunction ? ?Visit Diagnosis: ?Cramp and spasm ? ?Muscle weakness (generalized) ? ?Other abnormalities of gait and mobility ? ?Pain in left hip ? ?Stiffness of left hip, not elsewhere classified ? ? ? ? ?Problem List ?Patient Active Problem List  ? Diagnosis Date Noted  ? 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 Fall 07/26/2021  ? Acute bilateral low back pain without sciatica 07/25/2021  ? Syncope 05/31/2021  ? Acute gastritis without hemorrhage 05/31/2021  ? Hamstring strain, left, initial encounter 04/28/2021  ? Bipolar 1 disorder, mixed ([place]) 03/21/2021  ? No energy 02/28/2021  ? Lack of motivation 02/28/2021  ? Rectocele 12/06/2020  ? Toenail fungus 09/14/2020  ? Corn of toe 09/14/2020  ? Tapeworm 04/02/2020  ? Unintended weight loss 02/18/2020  ? Loss of taste 02/18/2020  ? Scalp itch 01/12/2020  ? Allergic reaction 01/09/2020  ? Mold exposure 12/24/2019  ? Chest tightness 12/24/2019  ? History of COVID-19 10/07/2019  ? COVID-19 virus infection 09/22/2019  ? Suspected COVID-19 virus infection 09/11/2019  ? Seborrheic keratoses 05/14/2019  ? Food allergy 01/29/2019  ? 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  ? Chronic constipation 10/30/2018  ? Cervical radiculopathy 08/21/2018  ? Tonsillith 07/30/2018  ? Callus of foot 07/29/2018  ? Multiple food allergies 03/25/2018  ? Environmental allergies 03/25/2018  ? CFS (chronic fatigue syndrome) 11/27/2017  ? Perennial allergic rhinitis 08/13/2017  ? 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  ? Memory changes 07/19/2017  ? Hyperlipidemia 07/11/2017  ? Elevated blood pressure reading 07/11/2017  ? History of colon resection 07/09/2017  ? History of irregular heartbeat 07/09/2017  ? Sinus bradycardia by electrocardiogram  07/09/2017  ? Left leg pain 03/18/2017  ? Right foot pain 03/18/2017  ? Menopausal symptoms 07/21/2016  ? Mood disorder in conditions classified elsewhere 07/21/2016  ? Plantar wart of left foot 02/29/2016  ? Dy

## 2021-09-21 DIAGNOSIS — M9902 Segmental and somatic dysfunction of thoracic region: Secondary | ICD-10-CM | POA: Diagnosis not present

## 2021-09-21 DIAGNOSIS — M5030 Other cervical disc degeneration, unspecified cervical region: Secondary | ICD-10-CM | POA: Diagnosis not present

## 2021-09-21 DIAGNOSIS — M9901 Segmental and somatic dysfunction of cervical region: Secondary | ICD-10-CM | POA: Diagnosis not present

## 2021-09-21 DIAGNOSIS — M9903 Segmental and somatic dysfunction of lumbar region: Secondary | ICD-10-CM | POA: Diagnosis not present

## 2021-09-21 DIAGNOSIS — M4125 Other idiopathic scoliosis, thoracolumbar region: Secondary | ICD-10-CM | POA: Diagnosis not present

## 2021-09-21 DIAGNOSIS — M9904 Segmental and somatic dysfunction of sacral region: Secondary | ICD-10-CM | POA: Diagnosis not present

## 2021-09-21 DIAGNOSIS — M5442 Lumbago with sciatica, left side: Secondary | ICD-10-CM | POA: Diagnosis not present

## 2021-09-26 DIAGNOSIS — M5442 Lumbago with sciatica, left side: Secondary | ICD-10-CM | POA: Diagnosis not present

## 2021-09-26 DIAGNOSIS — M9904 Segmental and somatic dysfunction of sacral region: Secondary | ICD-10-CM | POA: Diagnosis not present

## 2021-09-26 DIAGNOSIS — M9903 Segmental and somatic dysfunction of lumbar region: Secondary | ICD-10-CM | POA: Diagnosis not present

## 2021-09-26 DIAGNOSIS — M9901 Segmental and somatic dysfunction of cervical region: Secondary | ICD-10-CM | POA: Diagnosis not present

## 2021-09-26 DIAGNOSIS — M4125 Other idiopathic scoliosis, thoracolumbar region: Secondary | ICD-10-CM | POA: Diagnosis not present

## 2021-09-27 ENCOUNTER — Encounter: Payer: Self-pay | Admitting: Physician Assistant

## 2021-09-27 NOTE — Telephone Encounter (Signed)
Normally we could have our pharmacist call her but she is on maternity leave.  I would encouragd her to reach out to her pharmacist. They have better data bases for this information.  ?

## 2021-09-28 DIAGNOSIS — K279 Peptic ulcer, site unspecified, unspecified as acute or chronic, without hemorrhage or perforation: Secondary | ICD-10-CM | POA: Diagnosis not present

## 2021-09-28 DIAGNOSIS — K5900 Constipation, unspecified: Secondary | ICD-10-CM | POA: Diagnosis not present

## 2021-10-03 DIAGNOSIS — M9904 Segmental and somatic dysfunction of sacral region: Secondary | ICD-10-CM | POA: Diagnosis not present

## 2021-10-03 DIAGNOSIS — M9902 Segmental and somatic dysfunction of thoracic region: Secondary | ICD-10-CM | POA: Diagnosis not present

## 2021-10-03 DIAGNOSIS — M9903 Segmental and somatic dysfunction of lumbar region: Secondary | ICD-10-CM | POA: Diagnosis not present

## 2021-10-03 DIAGNOSIS — M9901 Segmental and somatic dysfunction of cervical region: Secondary | ICD-10-CM | POA: Diagnosis not present

## 2021-10-03 DIAGNOSIS — M4125 Other idiopathic scoliosis, thoracolumbar region: Secondary | ICD-10-CM | POA: Diagnosis not present

## 2021-10-03 DIAGNOSIS — M5030 Other cervical disc degeneration, unspecified cervical region: Secondary | ICD-10-CM | POA: Diagnosis not present

## 2021-10-03 DIAGNOSIS — M5442 Lumbago with sciatica, left side: Secondary | ICD-10-CM | POA: Diagnosis not present

## 2021-10-05 DIAGNOSIS — Y30XXXA Falling, jumping or pushed from a high place, undetermined intent, initial encounter: Secondary | ICD-10-CM | POA: Diagnosis not present

## 2021-10-05 DIAGNOSIS — M5489 Other dorsalgia: Secondary | ICD-10-CM | POA: Diagnosis not present

## 2021-10-06 ENCOUNTER — Ambulatory Visit (INDEPENDENT_AMBULATORY_CARE_PROVIDER_SITE_OTHER): Payer: Medicare Other | Admitting: Physician Assistant

## 2021-10-06 ENCOUNTER — Ambulatory Visit: Payer: Medicare Other | Admitting: Medical-Surgical

## 2021-10-06 VITALS — BP 122/77 | HR 60 | Ht 60.0 in | Wt 119.0 lb

## 2021-10-06 DIAGNOSIS — M159 Polyosteoarthritis, unspecified: Secondary | ICD-10-CM

## 2021-10-06 DIAGNOSIS — W19XXXD Unspecified fall, subsequent encounter: Secondary | ICD-10-CM

## 2021-10-06 DIAGNOSIS — G8929 Other chronic pain: Secondary | ICD-10-CM

## 2021-10-06 DIAGNOSIS — M545 Low back pain, unspecified: Secondary | ICD-10-CM | POA: Diagnosis not present

## 2021-10-06 DIAGNOSIS — B070 Plantar wart: Secondary | ICD-10-CM | POA: Diagnosis not present

## 2021-10-06 DIAGNOSIS — M150 Primary generalized (osteo)arthritis: Secondary | ICD-10-CM

## 2021-10-06 MED ORDER — TRAMADOL HCL 50 MG PO TABS: 50.0000 mg | ORAL_TABLET | Freq: Four times a day (QID) | ORAL | 0 refills | Status: DC | PRN

## 2021-10-06 MED ORDER — PANTOPRAZOLE SODIUM 40 MG PO TBEC: 40.0000 mg | DELAYED_RELEASE_TABLET | Freq: Every day | ORAL | 3 refills | Status: DC

## 2021-10-06 NOTE — Progress Notes (Signed)
? ?Subjective:  ? ? Patient ID: Judy Russell, female    DOB: October 05, 1955, 66 y.o.   MRN: [id number] ? ?HPI ?Pt is a 66 y o female with OA in multiple joints and chronic low back pain. She cannot take NSAIDs due to GI ulcers. She does not like to be on medications. She takes oral turmeric and co-enyzme 10 for pain. She uses tens unit, icy hot patches and PT exercises which all help. She fell yesterday in [employer] on a floor with liquid soap on the aisle. She is more achy over her left hip and buttocks. No bowel or bladder dysfunction, leg weakness, or saddle anesthesia. She went to [facility] yesterday. No xrays were done.  ? ?Pt has some pain over a wart on left foot when running and walking. She has had it frozen before and would like it done again.  ? ?.. ?Active Ambulatory Problems  ?  Diagnosis Date Noted  ? Hypothyroidism 09/04/2009  ? SEIZURE DISORDER 09/04/2009  ? Fibromyalgia 05/05/2015  ? Primary osteoarthritis involving multiple joints 05/05/2015  ? Thyroid activity decreased 05/05/2015  ? Chronic neck pain 05/05/2015  ? Dyspareunia in female 07/07/2015  ? Herpes genitalis in women 07/07/2015  ? Plantar wart of left foot 02/29/2016  ? Menopausal symptoms 07/21/2016  ? Mood disorder in conditions classified elsewhere 07/21/2016  ? Left leg pain 03/18/2017  ? Right foot pain 03/18/2017  ? History of colon resection 07/09/2017  ? History of irregular heartbeat 07/09/2017  ? Sinus bradycardia by electrocardiogram 07/09/2017  ? Hyperlipidemia 07/11/2017  ? Elevated blood pressure reading 07/11/2017  ? Memory changes 07/19/2017  ? 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  ? Perennial allergic rhinitis 08/13/2017  ? CFS (chronic fatigue syndrome) 11/27/2017  ? Multiple food allergies 03/25/2018  ? Environmental allergies 03/25/2018  ? Callus of foot 07/29/2018  ? Tonsillith 07/30/2018  ? Cervical radiculopathy 08/21/2018  ? Chronic constipation 10/30/2018  ? Food allergy 01/29/2019  ? 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  ? Seborrheic  keratoses 05/14/2019  ? Suspected COVID-19 virus infection 09/11/2019  ? COVID-19 virus infection 09/22/2019  ? History of COVID-19 10/07/2019  ? Mold exposure 12/24/2019  ? Chest tightness 12/24/2019  ? Allergic reaction 01/09/2020  ? Scalp itch 01/12/2020  ? Unintended weight loss 02/18/2020  ? Loss of taste 02/18/2020  ? Tapeworm 04/02/2020  ? Toenail fungus 09/14/2020  ? Corn of toe 09/14/2020  ? Rectocele 12/06/2020  ? No energy 02/28/2021  ? Lack of motivation 02/28/2021  ? Bipolar 1 disorder, mixed (HCC) 03/21/2021  ? Hamstring strain, left, initial encounter 04/28/2021  ? Syncope 05/31/2021  ? Acute gastritis without hemorrhage 05/31/2021  ? Chronic bilateral low back pain without sciatica 07/25/2021  ? Fall 07/26/2021  ? 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 ?Resolved Ambulatory Problems  ?  Diagnosis Date Noted  ? URI 09/04/2009  ? Acute bronchitis 05/03/2016  ? Essential hypertension 07/19/2017  ? DDD (degenerative disc disease), cervical 08/21/2018  ? ?Past Medical History:  ?Diagnosis Date  ? Allergy   ? HSV-2 (herpes simplex virus 2) infection   ? Seizures (HCC)   ? Thyroid disease   ? ? ? ?Review of Systems ?See HPI.  ?   ?Objective:  ? Physical Exam ?Vitals reviewed.  ?Constitutional:   ?   Appearance: Normal appearance.  ?HENT:  ?   Head: Normocephalic.  ?Cardiovascular:  ?   Rate and Rhythm: Normal rate and regular rhythm.  ?Pulmonary:  ?   Effort: Pulmonary effort is  normal.  ?Musculoskeletal:  ?   Right lower leg: No edema.  ?   Left lower leg: No edema.  ?     Feet: ? ?   Comments: NROM at waist ?Some tenderness over lumbar spine and paraspinal muscles ?Negative straight leg raise bilaterally ?Strength lower ext 5/5 ?  ?Neurological:  ?   General: No focal deficit present.  ?   Mental Status: She is alert and oriented to person, place, and time.  ?Psychiatric:     ?   Mood and Affect: Mood normal.  ? ? ?Cryotherapy Procedure Note ? ?Pre-operative Diagnosis: plantar  wart ? ?Post-operative Diagnosis: plantar wart ? ?Locations: left plantar foot ? ?Indications: pain ? ?Procedure Details  ?History of allergy to iodine: no. ?Pacemaker? no. ? ?Patient informed of risks (permanent scarring, infection, light or dark discoloration, bleeding, infection, weakness, numbness and recurrence of the lesion) and benefits of the procedure and verbal informed consent obtained. ? ?The areas are treated with liquid nitrogen therapy, frozen until ice ball extended 3 mm beyond lesion, allowed to thaw, and treated again. The patient tolerated procedure well.  The patient was instructed on post-op care, warned that there may be blister formation, redness and pain. Recommend OTC analgesia as needed for pain. ? ?Condition: ?Stable ? ?Complications: ?none. ? ?Plan: ?1. Instructed to keep the area dry and covered for 24-48h and clean thereafter. ?2. Warning signs of infection were reviewed.   ?3. Recommended that the patient use OTC acetaminophen as needed for pain.  ?4. Return in 2 weeks. ? ? ? ?   ?Assessment & Plan:  ?..Joyia was seen today for arthritis. ? ?Diagnoses and all orders for this visit: ? ?Primary osteoarthritis involving multiple joints ?-     traMADol (ULTRAM) 50 MG tablet; Take 1 tablet (50 mg total) by mouth every 6 (six) hours as needed for moderate pain. ? ?Plantar wart of left foot ? ?Chronic bilateral low back pain without sciatica ?-     traMADol (ULTRAM) 50 MG tablet; Take 1 tablet (50 mg total) by mouth every 6 (six) hours as needed for moderate pain. ? ?Fall, subsequent encounter ?-     traMADol (ULTRAM) 50 MG tablet; Take 1 tablet (50 mg total) by mouth every 6 (six) hours as needed for moderate pain. ? ?Other orders ?-     pantoprazole (PROTONIX) 40 MG tablet; Take 1 tablet (40 mg total) by mouth daily. ? ? ?Declined Toradol injection ?Cannot do oral NSAIDs due to hx of GI ulcer.  ?Discussed options for medication and chronic pain, pt declined. ?Continue PT, icy hot, tens  unit, massage.  ?Added tramadol as needed for break through pain. ?Discussed did have some corn containing products and to use as needed.  ? ?Plantar wart frozen today.  ?

## 2021-10-07 ENCOUNTER — Encounter: Payer: Self-pay | Admitting: Physician Assistant

## 2021-10-09 ENCOUNTER — Encounter: Payer: Self-pay | Admitting: Physician Assistant

## 2021-10-10 DIAGNOSIS — M9903 Segmental and somatic dysfunction of lumbar region: Secondary | ICD-10-CM | POA: Diagnosis not present

## 2021-10-10 DIAGNOSIS — M9902 Segmental and somatic dysfunction of thoracic region: Secondary | ICD-10-CM | POA: Diagnosis not present

## 2021-10-10 DIAGNOSIS — M9901 Segmental and somatic dysfunction of cervical region: Secondary | ICD-10-CM | POA: Diagnosis not present

## 2021-10-10 DIAGNOSIS — M9904 Segmental and somatic dysfunction of sacral region: Secondary | ICD-10-CM | POA: Diagnosis not present

## 2021-10-10 DIAGNOSIS — M5030 Other cervical disc degeneration, unspecified cervical region: Secondary | ICD-10-CM | POA: Diagnosis not present

## 2021-10-10 DIAGNOSIS — M4125 Other idiopathic scoliosis, thoracolumbar region: Secondary | ICD-10-CM | POA: Diagnosis not present

## 2021-10-10 DIAGNOSIS — M5442 Lumbago with sciatica, left side: Secondary | ICD-10-CM | POA: Diagnosis not present

## 2021-10-13 ENCOUNTER — Encounter: Payer: Self-pay | Admitting: Physician Assistant

## 2021-10-17 DIAGNOSIS — M5030 Other cervical disc degeneration, unspecified cervical region: Secondary | ICD-10-CM | POA: Diagnosis not present

## 2021-10-17 DIAGNOSIS — M4125 Other idiopathic scoliosis, thoracolumbar region: Secondary | ICD-10-CM | POA: Diagnosis not present

## 2021-10-17 DIAGNOSIS — M9904 Segmental and somatic dysfunction of sacral region: Secondary | ICD-10-CM | POA: Diagnosis not present

## 2021-10-17 DIAGNOSIS — M9902 Segmental and somatic dysfunction of thoracic region: Secondary | ICD-10-CM | POA: Diagnosis not present

## 2021-10-17 DIAGNOSIS — M5442 Lumbago with sciatica, left side: Secondary | ICD-10-CM | POA: Diagnosis not present

## 2021-10-17 DIAGNOSIS — M9901 Segmental and somatic dysfunction of cervical region: Secondary | ICD-10-CM | POA: Diagnosis not present

## 2021-10-17 DIAGNOSIS — M9903 Segmental and somatic dysfunction of lumbar region: Secondary | ICD-10-CM | POA: Diagnosis not present

## 2021-10-24 DIAGNOSIS — M9902 Segmental and somatic dysfunction of thoracic region: Secondary | ICD-10-CM | POA: Diagnosis not present

## 2021-10-24 DIAGNOSIS — M4125 Other idiopathic scoliosis, thoracolumbar region: Secondary | ICD-10-CM | POA: Diagnosis not present

## 2021-10-24 DIAGNOSIS — M5442 Lumbago with sciatica, left side: Secondary | ICD-10-CM | POA: Diagnosis not present

## 2021-10-24 DIAGNOSIS — M5030 Other cervical disc degeneration, unspecified cervical region: Secondary | ICD-10-CM | POA: Diagnosis not present

## 2021-10-24 DIAGNOSIS — M9904 Segmental and somatic dysfunction of sacral region: Secondary | ICD-10-CM | POA: Diagnosis not present

## 2021-10-24 DIAGNOSIS — M9901 Segmental and somatic dysfunction of cervical region: Secondary | ICD-10-CM | POA: Diagnosis not present

## 2021-10-24 DIAGNOSIS — M9903 Segmental and somatic dysfunction of lumbar region: Secondary | ICD-10-CM | POA: Diagnosis not present

## 2021-10-28 MED ORDER — LAMOTRIGINE 150 MG PO TABS: 150.0000 mg | ORAL_TABLET | Freq: Every day | ORAL | 1 refills | Status: DC

## 2021-10-28 MED ORDER — LAMOTRIGINE 100 MG PO TABS: 100.0000 mg | ORAL_TABLET | Freq: Every day | ORAL | 1 refills | Status: DC

## 2021-10-31 ENCOUNTER — Encounter: Payer: Self-pay | Admitting: Physician Assistant

## 2021-11-01 ENCOUNTER — Ambulatory Visit (INDEPENDENT_AMBULATORY_CARE_PROVIDER_SITE_OTHER): Payer: Medicare Other | Admitting: Physician Assistant

## 2021-11-01 ENCOUNTER — Encounter: Payer: Self-pay | Admitting: Physician Assistant

## 2021-11-01 VITALS — BP 142/67 | HR 51 | Ht 60.0 in | Wt 116.0 lb

## 2021-11-01 DIAGNOSIS — M47819 Spondylosis without myelopathy or radiculopathy, site unspecified: Secondary | ICD-10-CM

## 2021-11-01 DIAGNOSIS — M545 Low back pain, unspecified: Secondary | ICD-10-CM

## 2021-11-01 DIAGNOSIS — W19XXXD Unspecified fall, subsequent encounter: Secondary | ICD-10-CM | POA: Diagnosis not present

## 2021-11-01 DIAGNOSIS — G8929 Other chronic pain: Secondary | ICD-10-CM

## 2021-11-01 DIAGNOSIS — M159 Polyosteoarthritis, unspecified: Secondary | ICD-10-CM | POA: Diagnosis not present

## 2021-11-01 MED ORDER — TRAMADOL HCL 50 MG PO TABS: 50.0000 mg | ORAL_TABLET | Freq: Every day | ORAL | 0 refills | Status: DC

## 2021-11-01 NOTE — Telephone Encounter (Signed)
Patient has been scheduled with [facility] for this afternoon @ 4pm. AMUCK ?

## 2021-11-01 NOTE — Progress Notes (Signed)
? ?Acute Office Visit ? ?Subjective:  ? ?  ?Patient ID: Judy Russell, female    DOB: 06-Feb-1956, 66 y.o.   MRN: [id number] ? ?Chief Complaint  ?Patient presents with  ? Hip Pain  ?  left  ? ? ?HPI ?Patient is in today to follow up on left sided low back pain. Pt has known lumbar DDD and chronic intermittent pain. She cannot take oral NSAIDs and does not want to take other medications. She has gotten a lot of benefit from tramadol at bedtime and would like refill. She did also benefit from dry needling with PT. She would like to try again. Her back is catching more and more. She often needs help standing. She denies any radiation of pain or numbness and tingling in legs. No bowel or bladder dysfunction. She is not running any more and walking is hard at times. Pain is much worse at night and first thing in the morning. Better when she gets moving.  ? ?.. ?Active Ambulatory Problems  ?  Diagnosis Date Noted  ? Hypothyroidism 09/04/2009  ? SEIZURE DISORDER 09/04/2009  ? Fibromyalgia 05/05/2015  ? Primary osteoarthritis involving multiple joints 05/05/2015  ? Thyroid activity decreased 05/05/2015  ? Chronic neck pain 05/05/2015  ? Dyspareunia in female 07/07/2015  ? Herpes genitalis in women 07/07/2015  ? Plantar wart of left foot 02/29/2016  ? Menopausal symptoms 07/21/2016  ? Mood disorder in conditions classified elsewhere 07/21/2016  ? Left leg pain 03/18/2017  ? Right foot pain 03/18/2017  ? History of colon resection 07/09/2017  ? History of irregular heartbeat 07/09/2017  ? Sinus bradycardia by electrocardiogram 07/09/2017  ? Hyperlipidemia 07/11/2017  ? Elevated blood pressure reading 07/11/2017  ? Memory changes 07/19/2017  ? 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  ? Perennial allergic rhinitis 08/13/2017  ? CFS (chronic fatigue syndrome) 11/27/2017  ? Multiple food allergies 03/25/2018  ? Environmental allergies 03/25/2018  ? Callus of foot 07/29/2018  ? Tonsillith 07/30/2018  ? Cervical radiculopathy 08/21/2018  ?  Chronic constipation 10/30/2018  ? Food allergy 01/29/2019  ? 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  ? Seborrheic keratoses 05/14/2019  ? Suspected COVID-19 virus infection 09/11/2019  ? COVID-19 virus infection 09/22/2019  ? History of COVID-19 10/07/2019  ? Mold exposure 12/24/2019  ? Chest tightness 12/24/2019  ? Allergic reaction 01/09/2020  ? Scalp itch 01/12/2020  ? Unintended weight loss 02/18/2020  ? Loss of taste 02/18/2020  ? Tapeworm 04/02/2020  ? Toenail fungus 09/14/2020  ? Corn of toe 09/14/2020  ? Rectocele 12/06/2020  ? No energy 02/28/2021  ? Lack of motivation 02/28/2021  ? Bipolar 1 disorder, mixed (HCC) 03/21/2021  ? Hamstring strain, left, initial encounter 04/28/2021  ? Syncope 05/31/2021  ? Acute gastritis without hemorrhage 05/31/2021  ? Chronic bilateral low back pain without sciatica 07/25/2021  ? Fall 07/26/2021  ? 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 ?Resolved Ambulatory Problems  ?  Diagnosis Date Noted  ? URI 09/04/2009  ? Acute bronchitis 05/03/2016  ? Essential hypertension 07/19/2017  ? DDD (degenerative disc disease), cervical 08/21/2018  ? ?Past Medical History:  ?Diagnosis Date  ? Allergy   ? HSV-2 (herpes simplex virus 2) infection   ? Seizures (HCC)   ? Thyroid disease   ? ? ? ?ROS ?See HPI.  ? ?   ?Objective:  ?  ?BP (!) 142/67   Pulse (!) 51   Ht 5' (1.524 m)   Wt 116 lb (52.6 kg)   SpO2 100%  BMI 22.65 kg/m?  ?BP Readings from Last 3 Encounters:  ?11/01/21 (!) 142/67  ?10/06/21 122/77  ?07/25/21 (!) 147/74  ? ?  ? ?Physical Exam ?Vitals reviewed.  ?Constitutional:   ?   Appearance: Normal appearance.  ?HENT:  ?   Head: Normocephalic.  ?Cardiovascular:  ?   Rate and Rhythm: Normal rate.  ?Pulmonary:  ?   Effort: Pulmonary effort is normal.  ?Musculoskeletal:  ?   Right lower leg: No edema.  ?   Left lower leg: No edema.  ?   Comments: Left lower back tightness and tenderness to palpation.  ?Some tenderness to palpation over lumbar spine ?Catching and pain with  forward flexion at the waist ?NROM of bilateral legs ?Negative SLR, bilaterally ?Bilateral lower extremity strength 5/5.  ?No tenderness over greater trochanters of hips  ?Neurological:  ?   General: No focal deficit present.  ?   Mental Status: She is alert and oriented to person, place, and time.  ?Psychiatric:     ?   Mood and Affect: Mood normal.  ? ? ? ? ?   ?Assessment & Plan:  ?..Judy Russell was seen today for hip pain. ? ?Diagnoses and all orders for this visit: ? 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 ?Primary osteoarthritis involving multiple joints ?-     traMADol (ULTRAM) 50 MG tablet; Take 1 tablet (50 mg total) by mouth at bedtime. ? ?Chronic bilateral low back pain without sciatica ?-     traMADol (ULTRAM) 50 MG tablet; Take 1 tablet (50 mg total) by mouth at bedtime. ?-     Ambulatory referral to Physical Therapy ?-     MR Lumbar Spine Wo Contrast; Future ? ?Fall, subsequent encounter ?-     traMADol (ULTRAM) 50 MG tablet; Take 1 tablet (50 mg total) by mouth at bedtime. ? ? ?Pt did get a lot of benefit from dry needling and PT ?Another PT referral made ?Ongoing pain and creating falls ?MRI ordered, pt is very concerned about her anxiety here and wants open MRI only.  ?Tramadol does help at bedtime, refilled today.  ?..PDMP reviewed during this encounter. ?No concerns  ?Continue tens unit, icy hot patches ?Discussed other medications such as lyrica/gabapentin/cymbalta/flexeril and pt declined ?She cannot take NSAIDs due to hx of gastritis ?Strongly urged patient to follow up with Dr. Karie Schwalbe after MRI for further planning. ? ?Spent 30 minutes reviewing chart, discussing options, reassuring patient and coordinating care.  ? ?Tandy Gaw, PA-C ? ? ?

## 2021-11-01 NOTE — Patient Instructions (Signed)
MRI ?PT another round ?

## 2021-11-02 DIAGNOSIS — M9904 Segmental and somatic dysfunction of sacral region: Secondary | ICD-10-CM | POA: Diagnosis not present

## 2021-11-02 DIAGNOSIS — M5442 Lumbago with sciatica, left side: Secondary | ICD-10-CM | POA: Diagnosis not present

## 2021-11-02 DIAGNOSIS — M9901 Segmental and somatic dysfunction of cervical region: Secondary | ICD-10-CM | POA: Diagnosis not present

## 2021-11-02 DIAGNOSIS — M9903 Segmental and somatic dysfunction of lumbar region: Secondary | ICD-10-CM | POA: Diagnosis not present

## 2021-11-02 DIAGNOSIS — M4125 Other idiopathic scoliosis, thoracolumbar region: Secondary | ICD-10-CM | POA: Diagnosis not present

## 2021-11-02 DIAGNOSIS — M5030 Other cervical disc degeneration, unspecified cervical region: Secondary | ICD-10-CM | POA: Diagnosis not present

## 2021-11-02 DIAGNOSIS — M9902 Segmental and somatic dysfunction of thoracic region: Secondary | ICD-10-CM | POA: Diagnosis not present

## 2021-11-07 ENCOUNTER — Ambulatory Visit: Payer: Medicare Other | Admitting: Physical Therapy

## 2021-11-08 ENCOUNTER — Other Ambulatory Visit: Payer: Self-pay | Admitting: Physician Assistant

## 2021-11-14 ENCOUNTER — Ambulatory Visit (INDEPENDENT_AMBULATORY_CARE_PROVIDER_SITE_OTHER): Payer: Medicare Other

## 2021-11-14 DIAGNOSIS — M47819 Spondylosis without myelopathy or radiculopathy, site unspecified: Secondary | ICD-10-CM

## 2021-11-14 DIAGNOSIS — G8929 Other chronic pain: Secondary | ICD-10-CM

## 2021-11-14 DIAGNOSIS — M545 Low back pain, unspecified: Secondary | ICD-10-CM

## 2021-11-14 DIAGNOSIS — M47816 Spondylosis without myelopathy or radiculopathy, lumbar region: Secondary | ICD-10-CM | POA: Diagnosis not present

## 2021-11-14 DIAGNOSIS — M5126 Other intervertebral disc displacement, lumbar region: Secondary | ICD-10-CM | POA: Diagnosis not present

## 2021-11-15 ENCOUNTER — Encounter: Payer: Self-pay | Admitting: Physician Assistant

## 2021-11-15 NOTE — Progress Notes (Signed)
No acute findings.  ?Multilevel degenerative disc disease ?Multiple disc that are bulging ?You do have some marrow edema to the left at L2 and L3 that could be causing your worsening of symptoms ? ?Follow up with Dr. Darene Lamer so he can review MRI and make suggestions. Keep treatment plan as discussed.

## 2021-11-18 ENCOUNTER — Ambulatory Visit (INDEPENDENT_AMBULATORY_CARE_PROVIDER_SITE_OTHER): Payer: Medicare Other | Admitting: Sports Medicine

## 2021-11-18 DIAGNOSIS — M47819 Spondylosis without myelopathy or radiculopathy, site unspecified: Secondary | ICD-10-CM | POA: Diagnosis not present

## 2021-11-18 NOTE — Progress Notes (Signed)
    Procedures performed today:    None.  Independent interpretation of notes and tests performed by another provider:   None.  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 She was treated with tramadol by her PCP which seems to help, she tells me 1 tramadol a day seems to control her pain well, I have asked her to play around with the dosing, potentially doing 1/2 pill twice a day or adding an extra half pill at night or in the morning, she can continue Tylenol. I have advised her that she can continue exercise and potentially running as well. No indication for an epidural at this juncture. Return to see me back as needed.    ___________________________________________ Ihor Austin. Benjamin Stain, M.D., ABFM., CAQSM. Primary Care and Sports Medicine [facility]  Adjunct Instructor of Family Medicine  [employer]

## 2021-11-18 NOTE — Assessment & Plan Note (Signed)
Pleasant 66 year old female returns, she has known lumbar DDD, she just got an MRI that showed the expected. She was treated with tramadol by her PCP which seems to help, she tells me 1 tramadol a day seems to control her pain well, I have asked her to play around with the dosing, potentially doing 1/2 pill twice a day or adding an extra half pill at night or in the morning, she can continue Tylenol. I have advised her that she can continue exercise and potentially running as well. No indication for an epidural at this juncture. Return to see me back as needed.

## 2021-11-21 ENCOUNTER — Encounter: Payer: Self-pay | Admitting: Physician Assistant

## 2021-11-24 ENCOUNTER — Ambulatory Visit: Payer: Medicare Other | Attending: Sports Medicine | Admitting: Physical Therapy

## 2021-11-24 DIAGNOSIS — M6281 Muscle weakness (generalized): Secondary | ICD-10-CM | POA: Diagnosis present

## 2021-11-24 DIAGNOSIS — G8929 Other chronic pain: Secondary | ICD-10-CM | POA: Diagnosis present

## 2021-11-24 DIAGNOSIS — M5459 Other low back pain: Secondary | ICD-10-CM | POA: Diagnosis present

## 2021-11-24 DIAGNOSIS — R2689 Other abnormalities of gait and mobility: Secondary | ICD-10-CM

## 2021-11-24 DIAGNOSIS — R252 Cramp and spasm: Secondary | ICD-10-CM

## 2021-11-24 DIAGNOSIS — M545 Low back pain, unspecified: Secondary | ICD-10-CM | POA: Insufficient documentation

## 2021-11-24 DIAGNOSIS — M47819 Spondylosis without myelopathy or radiculopathy, site unspecified: Secondary | ICD-10-CM | POA: Insufficient documentation

## 2021-11-24 NOTE — Therapy (Signed)
[facility] [place] [address] [place], [place], [address] Phone: [phone]   Fax:  [phone]  Physical Therapy Evaluation  Rationale for Evaluation and Treatment Rehabilitation  Patient Details  Name: Judy Russell MRN: [id number] Date of Birth: 16-Nov-1955 Referring Provider (PT): Jomarie Longs, [place]   Encounter Date: 11/24/2021   PT End of Session - 11/24/21 1323     Visit Number 1    Number of Visits 8    Date for PT Re-Evaluation 01/19/22    Authorization Type [employer] Medicare    PT Start Time 0845    PT Stop Time 0930    PT Time Calculation (min) 45 min    Activity Tolerance Patient tolerated treatment well    Behavior During Therapy [facility] for tasks assessed/performed             Past Medical History:  Diagnosis Date   Allergy    Fibromyalgia    HSV-2 (herpes simplex virus 2) infection    Seizures (HCC)    Thyroid disease     Past Surgical History:  Procedure Laterality Date   APPENDECTOMY     DILATION AND CURETTAGE OF UTERUS     OOPHORECTOMY Left     There were no vitals filed for this visit.    Subjective Assessment - 11/24/21 0847     Subjective Pt states that exercises have been hurting. Pt reports continued morning stiffness and "locking up." It get better throughout the day as long as she's moving. Pt started using lumbar brace. Pt got recent MRI which showed lumbar bulging discs L1 to L3. Pt has been doing extension exercises at home seen from youtube. Pt states diagonal/pulling exercises have been painful.    Limitations Walking;House hold activities;Sitting    How long can you sit comfortably? Has to get up and walk around    How long can you stand comfortably? During the day she's okay    How long can you walk comfortably? ~1/2 mile    Patient Stated Goals Get back to running    Currently in Pain? 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    Referring Provider (PT) Jomarie Longs, PA-C    Prior Therapy Seen prior for hip pain      Precautions   Precautions None      Restrictions   Weight Bearing Restrictions No      Balance Screen   Has the patient fallen in the past 6 months No      Home Environment   Living Environment Private residence    Living Arrangements Spouse/significant other    Available Help at Discharge Family    Type of Home House      Prior Function   Vocation Retired      Observation/Other Assessments   Focus on Therapeutic Outcomes (FOTO)  n/a      Strength   Overall Strength Comments plank on forearm x 33 sec; unable to perform SLR on L in supine    Right Hip Flexion 5/5    Right  Hip Extension 4/5    Right Hip External Rotation  5/5    Right Hip Internal Rotation 5/5    Right Hip ABduction 4-/5    Right Hip ADduction 4/5    Left Hip Flexion 4-/5    Left Hip Extension 4/5    Left Hip External Rotation 4/5    Left Hip Internal Rotation 4/5   painful   Left Hip ABduction 3/5    Left Hip ADduction 3/5      Palpation   SI assessment  L iliac crest higher than R, L PSIS lower than R    Palpation comment Mild tenderness along upper L lumbar paraspinals      FABER test   findings Positive      Prone Knee Bend Test   Findings Positive      Straight Leg Raise   Findings Negative                        Objective measurements completed on examination: See above findings.                     PT Long Term Goals - 11/24/21 1328       PT LONG TERM GOAL #1   Title Pt will be independent with her HEP    Time 6    Period Weeks    Status New    Target Date 01/19/22      PT LONG TERM GOAL #2   Title Pt will demo L = R hip  strength    Baseline L hip grossly weak at 3 to 3+/5    Time 8    Period Weeks    Status New    Target Date 01/19/22      PT LONG TERM GOAL #3   Title Pt will report at least 50% decrease in her pain    Time 8    Period Weeks    Status New    Target Date 01/19/22      PT LONG TERM GOAL #4   Title Pt will be able to demo improved core strength by maintaining front and side planks to at least 45 sec    Baseline 33 sec front plank    Time 8    Period Weeks    Status New    Target Date 01/19/22                    Plan - 11/24/21 1323     Clinical Impression Statement Ms. "Judy" [name] returns with worsening of symptoms within the past few weeks. Pt notes increased "locking up" of her L back and hip in the morning and pain with her previous PT exercises. Pt received a recent MRI which indicated mild bulging disc and "Overall mild multilevel degenerative changes as detailed above  without high-grade stenosis. There is marrow edema eccentric to the  left at L2-L3 that may be a source reported symptoms." On assessment, L hip remains greatly weaker than R. Pt demos decreased SI alignment. Brief questioning indicates possible pelvic floor involvement. Pt would benefit from PT to address these dysfunctions to decrease her pain.    Personal Factors and Comorbidities Age;Fitness;Time since onset of injury/illness/exacerbation    Examination-Activity Limitations Locomotion Level;Transfers;Bed Mobility;Stand    Examination-Participation Restrictions Community Activity    Stability/Clinical Decision Making Evolving/Moderate complexity    Rehab Potential Good    PT Frequency Other (comment)  1-2x/wk per pt's schedule   PT Duration 6 weeks    PT Treatment/Interventions ADLs/Self Care Home Management;Aquatic Therapy;Electrical Stimulation;Cryotherapy;Moist Heat;DME Instruction;Ultrasound;Iontophoresis 4mg /ml Dexamethasone;Gait training;Stair training;Functional mobility training;Therapeutic  activities;Therapeutic exercise;Balance training;Neuromuscular re-education;Manual techniques;Patient/family education;Dry needling;Taping;Joint Manipulations;Spinal Manipulations;Passive range of motion    PT Next Visit Plan Continue extension based exercises. Work on pelvic floor. Hip and core strengthening.    PT Home Exercise Plan Access Code: [email or web address]    Consulted and Agree with Plan of Care Patient             Patient will benefit from skilled therapeutic intervention in order to improve the following deficits and impairments:  Decreased range of motion, Increased fascial restricitons, Difficulty walking, Increased muscle spasms, Decreased activity tolerance, Pain, Hypomobility, Impaired flexibility, Decreased mobility, Decreased strength, Postural dysfunction  Visit Diagnosis: Cramp and spasm  Other low back pain  Muscle weakness (generalized)  Other abnormalities of gait and mobility     Problem List Patient Active Problem List   Diagnosis Date Noted   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 07/27/2021   Fall 07/26/2021   Chronic bilateral low back pain without sciatica 07/25/2021   Syncope 05/31/2021   Acute gastritis without hemorrhage 05/31/2021   Hamstring strain, left, initial encounter 04/28/2021   Bipolar 1 disorder, mixed (HCC) 03/21/2021   No energy 02/28/2021   Lack of motivation 02/28/2021   Rectocele 12/06/2020   Toenail fungus 09/14/2020   Corn of toe 09/14/2020   Tapeworm 04/02/2020   Unintended weight loss 02/18/2020   Loss of taste 02/18/2020   Scalp itch 01/12/2020   Allergic reaction 01/09/2020   Mold exposure 12/24/2019   Chest tightness 12/24/2019   History of COVID-19 10/07/2019   COVID-19 virus infection 09/22/2019   Suspected COVID-19 virus infection 09/11/2019   Seborrheic keratoses 05/14/2019   Food allergy 01/29/2019   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   Chronic constipation 10/30/2018   Cervical radiculopathy 08/21/2018   Tonsillith  07/30/2018   Callus of foot 07/29/2018   Multiple food allergies 03/25/2018   Environmental allergies 03/25/2018   CFS (chronic fatigue syndrome) 11/27/2017   Perennial allergic rhinitis 08/13/2017   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   Memory changes 07/19/2017   Hyperlipidemia 07/11/2017   Elevated blood pressure reading 07/11/2017   History of colon resection 07/09/2017   History of irregular heartbeat 07/09/2017   Sinus bradycardia by electrocardiogram 07/09/2017   Left leg pain 03/18/2017   Right foot pain 03/18/2017   Menopausal symptoms 07/21/2016   Mood disorder in conditions classified elsewhere 07/21/2016   Plantar wart of left foot 02/29/2016   Dyspareunia in female 07/07/2015   Herpes genitalis in women 07/07/2015   Fibromyalgia 05/05/2015   Primary osteoarthritis involving multiple joints 05/05/2015   Thyroid activity decreased 05/05/2015   Chronic neck pain 05/05/2015   Hypothyroidism 09/04/2009   SEIZURE DISORDER 09/04/2009    [facility] April May, PT, DPT 11/24/2021, 1:34 PM  [facility] [address] [place], [place], [place] Phone: [phone]   Fax:  [phone]  Name: Judy Russell MRN: [id number] Bo Date of Birth: 1955/09/13

## 2021-11-29 ENCOUNTER — Ambulatory Visit: Payer: Medicare Other | Admitting: Physical Therapy

## 2021-11-30 DIAGNOSIS — M4125 Other idiopathic scoliosis, thoracolumbar region: Secondary | ICD-10-CM | POA: Diagnosis not present

## 2021-11-30 DIAGNOSIS — M9903 Segmental and somatic dysfunction of lumbar region: Secondary | ICD-10-CM | POA: Diagnosis not present

## 2021-11-30 DIAGNOSIS — M9904 Segmental and somatic dysfunction of sacral region: Secondary | ICD-10-CM | POA: Diagnosis not present

## 2021-11-30 DIAGNOSIS — M5136 Other intervertebral disc degeneration, lumbar region: Secondary | ICD-10-CM | POA: Diagnosis not present

## 2021-11-30 DIAGNOSIS — M5442 Lumbago with sciatica, left side: Secondary | ICD-10-CM | POA: Diagnosis not present

## 2021-11-30 DIAGNOSIS — M9902 Segmental and somatic dysfunction of thoracic region: Secondary | ICD-10-CM | POA: Diagnosis not present

## 2021-12-06 DIAGNOSIS — M5442 Lumbago with sciatica, left side: Secondary | ICD-10-CM | POA: Diagnosis not present

## 2021-12-06 DIAGNOSIS — M9903 Segmental and somatic dysfunction of lumbar region: Secondary | ICD-10-CM | POA: Diagnosis not present

## 2021-12-06 DIAGNOSIS — M9904 Segmental and somatic dysfunction of sacral region: Secondary | ICD-10-CM | POA: Diagnosis not present

## 2021-12-06 DIAGNOSIS — M4125 Other idiopathic scoliosis, thoracolumbar region: Secondary | ICD-10-CM | POA: Diagnosis not present

## 2021-12-06 DIAGNOSIS — M5136 Other intervertebral disc degeneration, lumbar region: Secondary | ICD-10-CM | POA: Diagnosis not present

## 2021-12-06 DIAGNOSIS — M9902 Segmental and somatic dysfunction of thoracic region: Secondary | ICD-10-CM | POA: Diagnosis not present

## 2021-12-07 ENCOUNTER — Ambulatory Visit: Payer: Medicare Other | Attending: Sports Medicine | Admitting: Physical Therapy

## 2021-12-07 DIAGNOSIS — M25552 Pain in left hip: Secondary | ICD-10-CM | POA: Insufficient documentation

## 2021-12-07 DIAGNOSIS — R252 Cramp and spasm: Secondary | ICD-10-CM | POA: Insufficient documentation

## 2021-12-07 DIAGNOSIS — M25652 Stiffness of left hip, not elsewhere classified: Secondary | ICD-10-CM | POA: Insufficient documentation

## 2021-12-07 DIAGNOSIS — M6281 Muscle weakness (generalized): Secondary | ICD-10-CM | POA: Insufficient documentation

## 2021-12-07 DIAGNOSIS — R2689 Other abnormalities of gait and mobility: Secondary | ICD-10-CM | POA: Insufficient documentation

## 2021-12-07 DIAGNOSIS — M5459 Other low back pain: Secondary | ICD-10-CM | POA: Insufficient documentation

## 2021-12-13 DIAGNOSIS — M5136 Other intervertebral disc degeneration, lumbar region: Secondary | ICD-10-CM | POA: Diagnosis not present

## 2021-12-13 DIAGNOSIS — M4125 Other idiopathic scoliosis, thoracolumbar region: Secondary | ICD-10-CM | POA: Diagnosis not present

## 2021-12-13 DIAGNOSIS — M9903 Segmental and somatic dysfunction of lumbar region: Secondary | ICD-10-CM | POA: Diagnosis not present

## 2021-12-13 DIAGNOSIS — M9904 Segmental and somatic dysfunction of sacral region: Secondary | ICD-10-CM | POA: Diagnosis not present

## 2021-12-13 DIAGNOSIS — M5442 Lumbago with sciatica, left side: Secondary | ICD-10-CM | POA: Diagnosis not present

## 2021-12-13 DIAGNOSIS — M9902 Segmental and somatic dysfunction of thoracic region: Secondary | ICD-10-CM | POA: Diagnosis not present

## 2021-12-14 ENCOUNTER — Ambulatory Visit: Payer: Medicare Other | Admitting: Physical Therapy

## 2021-12-14 DIAGNOSIS — M5459 Other low back pain: Secondary | ICD-10-CM | POA: Diagnosis present

## 2021-12-14 DIAGNOSIS — R252 Cramp and spasm: Secondary | ICD-10-CM | POA: Diagnosis present

## 2021-12-14 DIAGNOSIS — R2689 Other abnormalities of gait and mobility: Secondary | ICD-10-CM | POA: Diagnosis present

## 2021-12-14 DIAGNOSIS — M25552 Pain in left hip: Secondary | ICD-10-CM | POA: Diagnosis present

## 2021-12-14 DIAGNOSIS — M25652 Stiffness of left hip, not elsewhere classified: Secondary | ICD-10-CM | POA: Diagnosis present

## 2021-12-14 DIAGNOSIS — M6281 Muscle weakness (generalized): Secondary | ICD-10-CM | POA: Diagnosis present

## 2021-12-14 NOTE — Therapy (Signed)
[facility] [address] [place] [address] [place], [place], [address] Phone: [phone]   Fax:  [phone]  Physical Therapy Treatment  Patient Details  Name: Judy Russell MRN: [id number] Date of Birth: 1955-11-22 Referring Provider (PT): Jomarie Longs, [place]   Encounter Date: 12/14/2021   PT End of Session - 12/14/21 1627     Visit Number 2    Number of Visits 8    Date for PT Re-Evaluation 01/19/22    Authorization Type U[employer] Medicare    PT Start Time 1400    PT Stop Time 1445    PT Time Calculation (min) 45 min    Activity Tolerance Patient tolerated treatment well    Behavior During Therapy [facility] for tasks assessed/performed             Past Medical History:  Diagnosis Date   Allergy    Fibromyalgia    HSV-2 (herpes simplex virus 2) infection    Seizures (HCC)    Thyroid disease     Past Surgical History:  Procedure Laterality Date   APPENDECTOMY     DILATION AND CURETTAGE OF UTERUS     OOPHORECTOMY Left     There were no vitals filed for this visit.   Subjective Assessment - 12/14/21 1627     Subjective Pt reports seeing chiropractor yesterday who worked on her glute med trigger point. No big changes with her morning stiffness and "locking up."    Limitations Walking;House hold activities;Sitting    How long can you sit comfortably? Has to get up and walk around    How long can you stand comfortably? During the day she's okay    How long can you walk comfortably? 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    Referring Provider (PT) Nolene Ebbs                           [facility] PT Treatment/Exercise - 12/14/21 0001       Knee/Hip Exercises: Stretches   Piriformis Stretch Left;30 seconds    Other Knee/Hip  Stretches butterfly stretch x1 min      Knee/Hip Exercises: Aerobic   Tread Mill 1.8 mph x 5 min      Knee/Hip Exercises: Supine   Bridges with Clamshell Strengthening;Both;2 sets;10 reps   belt for iso   Other Supine Knee/Hip Exercises clam iso against PT's hand 5x5 sec      Manual Therapy   Manual therapy comments PROM L hip ER/IR with flexion    Soft tissue mobilization STM & TPR L glute med                          PT Long Term Goals - 11/24/21 1328       PT LONG TERM GOAL #1   Title Pt will be independent with her HEP    Time 6    Period Weeks    Status New    Target Date 01/19/22      PT LONG TERM GOAL #2   Title Pt will demo L = R hip strength    Baseline L hip  grossly weak at 3 to 3+/5    Time 8    Period Weeks    Status New    Target Date 01/19/22      PT LONG TERM GOAL #3   Title Pt will report at least 50% decrease in her pain    Time 8    Period Weeks    Status New    Target Date 01/19/22      PT LONG TERM GOAL #4   Title Pt will be able to demo improved core strength by maintaining front and side planks to at least 45 sec    Baseline 33 sec front plank    Time 8    Period Weeks    Status New    Target Date 01/19/22                   Plan - 12/14/21 1625     Clinical Impression Statement Pt with reoccurence of L glute med trigger point. Manual work provided to address as well as stretching. Working on pelvic stability with hip abductors and adductors. Continued work on pelvic floor activation and relaxation.    Personal Factors and Comorbidities Age;Fitness;Time since onset of injury/illness/exacerbation    Examination-Activity Limitations Locomotion Level;Transfers;Bed Mobility;Stand    Examination-Participation Restrictions Community Activity    Stability/Clinical Decision Making Evolving/Moderate complexity    Rehab Potential Good    PT Frequency Other (comment)   1-2x/wk per pt's schedule   PT Duration 6 weeks    PT  Treatment/Interventions ADLs/Self Care Home Management;Aquatic Therapy;Electrical Stimulation;Cryotherapy;Moist Heat;DME Instruction;Ultrasound;Iontophoresis 4mg /ml Dexamethasone;Gait training;Stair training;Functional mobility training;Therapeutic activities;Therapeutic exercise;Balance training;Neuromuscular re-education;Manual techniques;Patient/family education;Dry needling;Taping;Joint Manipulations;Spinal Manipulations;Passive range of motion    PT Next Visit Plan Continue extension based exercises. Work on pelvic floor. Hip and core strengthening.    PT Home Exercise Plan Access Code: [email or web address]    Consulted and Agree with Plan of Care Patient             Patient will benefit from skilled therapeutic intervention in order to improve the following deficits and impairments:  Decreased range of motion, Increased fascial restricitons, Difficulty walking, Increased muscle spasms, Decreased activity tolerance, Pain, Hypomobility, Impaired flexibility, Decreased mobility, Decreased strength, Postural dysfunction  Visit Diagnosis: Cramp and spasm  Other low back pain  Muscle weakness (generalized)  Other abnormalities of gait and mobility  Pain in left hip  Stiffness of left hip, not elsewhere classified     Problem List Patient Active Problem List   Diagnosis Date Noted   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 07/27/2021   Fall 07/26/2021   Chronic bilateral low back pain without sciatica 07/25/2021   Syncope 05/31/2021   Acute gastritis without hemorrhage 05/31/2021   Hamstring strain, left, initial encounter 04/28/2021   Bipolar 1 disorder, mixed (HCC) 03/21/2021   No energy 02/28/2021   Lack of motivation 02/28/2021   Rectocele 12/06/2020   Toenail fungus 09/14/2020   Corn of toe 09/14/2020   Tapeworm 04/02/2020   Unintended weight loss 02/18/2020   Loss of taste 02/18/2020   Scalp itch 01/12/2020   Allergic reaction 01/09/2020   Mold exposure 12/24/2019   Chest  tightness 12/24/2019   History of COVID-19 10/07/2019   COVID-19 virus infection 09/22/2019   Suspected COVID-19 virus infection 09/11/2019   Seborrheic keratoses 05/14/2019   Food allergy 01/29/2019   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   Chronic constipation 10/30/2018   Cervical radiculopathy 08/21/2018   Tonsillith 07/30/2018   Callus of foot 07/29/2018  Multiple food allergies 03/25/2018   Environmental allergies 03/25/2018   CFS (chronic fatigue syndrome) 11/27/2017   Perennial allergic rhinitis 08/13/2017   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   Memory changes 07/19/2017   Hyperlipidemia 07/11/2017   Elevated blood pressure reading 07/11/2017   History of colon resection 07/09/2017   History of irregular heartbeat 07/09/2017   Sinus bradycardia by electrocardiogram 07/09/2017   Left leg pain 03/18/2017   Right foot pain 03/18/2017   Menopausal symptoms 07/21/2016   Mood disorder in conditions classified elsewhere 07/21/2016   Plantar wart of left foot 02/29/2016   Dyspareunia in female 07/07/2015   Herpes genitalis in women 07/07/2015   Fibromyalgia 05/05/2015   Primary osteoarthritis involving multiple joints 05/05/2015   Thyroid activity decreased 05/05/2015   Chronic neck pain 05/05/2015   Hypothyroidism 09/04/2009   SEIZURE DISORDER 09/04/2009    [facility] HospitalGellen April Ma L [place]ogi[place], [place], DPT 12/14/2021, 4:29 PM  [facility] [address] [place] [address] [place], [place], [address] Phone: [phone]   Fax:  [phone]  Name: Judy BoVivian Russell MRN: [id number] Date of Birth: 1956-05-30

## 2021-12-21 ENCOUNTER — Encounter: Payer: Medicare Other | Admitting: Physical Therapy

## 2021-12-26 DIAGNOSIS — M5442 Lumbago with sciatica, left side: Secondary | ICD-10-CM | POA: Diagnosis not present

## 2021-12-26 DIAGNOSIS — M5136 Other intervertebral disc degeneration, lumbar region: Secondary | ICD-10-CM | POA: Diagnosis not present

## 2021-12-26 DIAGNOSIS — M9904 Segmental and somatic dysfunction of sacral region: Secondary | ICD-10-CM | POA: Diagnosis not present

## 2021-12-26 DIAGNOSIS — M9902 Segmental and somatic dysfunction of thoracic region: Secondary | ICD-10-CM | POA: Diagnosis not present

## 2021-12-26 DIAGNOSIS — M9903 Segmental and somatic dysfunction of lumbar region: Secondary | ICD-10-CM | POA: Diagnosis not present

## 2021-12-26 DIAGNOSIS — M4125 Other idiopathic scoliosis, thoracolumbar region: Secondary | ICD-10-CM | POA: Diagnosis not present

## 2022-01-04 ENCOUNTER — Encounter: Payer: Self-pay | Admitting: Physical Therapy

## 2022-01-04 DIAGNOSIS — M9904 Segmental and somatic dysfunction of sacral region: Secondary | ICD-10-CM | POA: Diagnosis not present

## 2022-01-04 DIAGNOSIS — M4125 Other idiopathic scoliosis, thoracolumbar region: Secondary | ICD-10-CM | POA: Diagnosis not present

## 2022-01-04 DIAGNOSIS — M5136 Other intervertebral disc degeneration, lumbar region: Secondary | ICD-10-CM | POA: Diagnosis not present

## 2022-01-04 DIAGNOSIS — M9903 Segmental and somatic dysfunction of lumbar region: Secondary | ICD-10-CM | POA: Diagnosis not present

## 2022-01-04 DIAGNOSIS — M9902 Segmental and somatic dysfunction of thoracic region: Secondary | ICD-10-CM | POA: Diagnosis not present

## 2022-01-04 DIAGNOSIS — M5442 Lumbago with sciatica, left side: Secondary | ICD-10-CM | POA: Diagnosis not present

## 2022-01-05 ENCOUNTER — Encounter: Payer: Self-pay | Admitting: Physician Assistant

## 2022-01-05 DIAGNOSIS — Z79899 Other long term (current) drug therapy: Secondary | ICD-10-CM

## 2022-01-09 DIAGNOSIS — Z79899 Other long term (current) drug therapy: Secondary | ICD-10-CM | POA: Diagnosis not present

## 2022-01-10 ENCOUNTER — Encounter: Payer: Self-pay | Admitting: Physician Assistant

## 2022-01-10 ENCOUNTER — Telehealth (INDEPENDENT_AMBULATORY_CARE_PROVIDER_SITE_OTHER): Payer: Medicare Other | Admitting: Physician Assistant

## 2022-01-10 DIAGNOSIS — M47819 Spondylosis without myelopathy or radiculopathy, site unspecified: Secondary | ICD-10-CM

## 2022-01-10 DIAGNOSIS — R771 Abnormality of globulin: Secondary | ICD-10-CM

## 2022-01-10 DIAGNOSIS — G894 Chronic pain syndrome: Secondary | ICD-10-CM

## 2022-01-10 LAB — COMPLETE METABOLIC PANEL WITH GFR
AG Ratio: 2.9 (calc) — ABNORMAL HIGH (ref 1.0–2.5)
ALT: 25 U/L (ref 6–29)
AST: 30 U/L (ref 10–35)
Albumin: 4.7 g/dL (ref 3.6–5.1)
Alkaline phosphatase (APISO): 69 U/L (ref 37–153)
BUN: 19 mg/dL (ref 7–25)
CO2: 28 mmol/L (ref 20–32)
Calcium: 9.5 mg/dL (ref 8.6–10.4)
Chloride: 106 mmol/L (ref 98–110)
Creat: 0.91 mg/dL (ref 0.50–1.05)
Globulin: 1.6 g/dL (calc) — ABNORMAL LOW (ref 1.9–3.7)
Glucose, Bld: 89 mg/dL (ref 65–99)
Potassium: 4.7 mmol/L (ref 3.5–5.3)
Sodium: 142 mmol/L (ref 135–146)
Total Bilirubin: 0.4 mg/dL (ref 0.2–1.2)
Total Protein: 6.3 g/dL (ref 6.1–8.1)
eGFR: 71 mL/min/{1.73_m2} (ref >=60)

## 2022-01-10 MED ORDER — TRAMADOL HCL 50 MG PO TABS: ORAL_TABLET | ORAL | 0 refills | Status: DC

## 2022-01-10 NOTE — Progress Notes (Signed)
..Virtual Visit via Video Note  I connected with Judy Russell on 01/10/22 at  1:00 PM EDT by a video enabled telemedicine application and verified that I am speaking with the correct person using two identifiers.  Location: Patient: home Provider: clinic  .Marland KitchenParticipating in visit:  Patient: Judy Russell Provider: Tandy Gaw PA-C    I discussed the limitations of evaluation and management by telemedicine and the availability of in person appointments. The patient expressed understanding and agreed to proceed.  History of Present Illness: Pt is a 66 yo female with spondylosis of lumbar spine and chronic pain. Pt is taking tramadol and tylenol for pain. Most of her pain is at night and when she has been seated for a while. She is going to chiropractor and doing exercises daily. She is walking daily. Pain waxes and wanes. She is needing more tramadol due to her increasing how she takes it to 2 tablets at bedtime time.    .. 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   Left leg pain 03/18/2017   Right foot pain 03/18/2017   History of colon resection 07/09/2017   History of irregular heartbeat 07/09/2017   Sinus bradycardia by electrocardiogram 07/09/2017   Hyperlipidemia 07/11/2017   Elevated blood pressure reading 07/11/2017   Memory changes 07/19/2017   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   Perennial allergic rhinitis 08/13/2017   CFS (chronic fatigue syndrome) 11/27/2017   Multiple food allergies 03/25/2018   Environmental allergies 03/25/2018   Callus of  foot 07/29/2018   Tonsillith 07/30/2018   Cervical radiculopathy 08/21/2018   Chronic constipation 10/30/2018   Food allergy 01/29/2019   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   Seborrheic keratoses 05/14/2019   Suspected COVID-19 virus infection 09/11/2019   COVID-19 virus infection 09/22/2019   History of COVID-19 10/07/2019   Mold exposure 12/24/2019   Chest tightness 12/24/2019   Allergic reaction 01/09/2020   Scalp itch 01/12/2020   Unintended weight loss 02/18/2020   Loss of taste 02/18/2020   Tapeworm 04/02/2020   Toenail fungus 09/14/2020   Corn of toe 09/14/2020   Rectocele 12/06/2020   No energy 02/28/2021   Lack of motivation 02/28/2021   Bipolar 1 disorder, mixed (HCC) 03/21/2021   Hamstring strain, left, initial encounter 04/28/2021   Syncope 05/31/2021   Acute gastritis without hemorrhage 05/31/2021   Chronic bilateral low back pain without sciatica 07/25/2021   Fall 07/26/2021   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  Chronic pain syndrome -     traMADol (ULTRAM) 50 MG tablet; Take 1-2 tablets up to twice a day(every 12 hours).  Abnormality of globulin   Discussed low globulin in labs.  Liver and kidney function looks great Encouraged to make sure  getting protein and eating good 3 meals a day.   [name].PDMP reviewed during this encounter. Increased tramadol to 1-2 tablet every 12 hours. Consider adding turmeric for inflammation Pt cannot tolerate NSAIds due to GI bleed/ulcers Continue walking daily Follow up in 3 months.    Follow Up Instructions:    I discussed the assessment and treatment plan with the patient. The patient was provided an opportunity to ask questions and all were answered. The patient agreed with the plan and demonstrated an understanding of the instructions.   The patient was advised to call back or seek an in-person evaluation if the symptoms worsen or if the condition fails to improve as anticipated.    Tandy Gaw, PA-C

## 2022-01-10 NOTE — Progress Notes (Signed)
Liver and kidney look good.  Low globulin- are you eating 3 meals a day and getting enough protein?

## 2022-01-11 DIAGNOSIS — M4125 Other idiopathic scoliosis, thoracolumbar region: Secondary | ICD-10-CM | POA: Diagnosis not present

## 2022-01-11 DIAGNOSIS — M9904 Segmental and somatic dysfunction of sacral region: Secondary | ICD-10-CM | POA: Diagnosis not present

## 2022-01-11 DIAGNOSIS — M5136 Other intervertebral disc degeneration, lumbar region: Secondary | ICD-10-CM | POA: Diagnosis not present

## 2022-01-11 DIAGNOSIS — M9902 Segmental and somatic dysfunction of thoracic region: Secondary | ICD-10-CM | POA: Diagnosis not present

## 2022-01-11 DIAGNOSIS — M9903 Segmental and somatic dysfunction of lumbar region: Secondary | ICD-10-CM | POA: Diagnosis not present

## 2022-01-11 DIAGNOSIS — M5442 Lumbago with sciatica, left side: Secondary | ICD-10-CM | POA: Diagnosis not present

## 2022-01-18 DIAGNOSIS — M5136 Other intervertebral disc degeneration, lumbar region: Secondary | ICD-10-CM | POA: Diagnosis not present

## 2022-01-18 DIAGNOSIS — M5442 Lumbago with sciatica, left side: Secondary | ICD-10-CM | POA: Diagnosis not present

## 2022-01-18 DIAGNOSIS — M9904 Segmental and somatic dysfunction of sacral region: Secondary | ICD-10-CM | POA: Diagnosis not present

## 2022-01-18 DIAGNOSIS — M9903 Segmental and somatic dysfunction of lumbar region: Secondary | ICD-10-CM | POA: Diagnosis not present

## 2022-01-18 DIAGNOSIS — M4125 Other idiopathic scoliosis, thoracolumbar region: Secondary | ICD-10-CM | POA: Diagnosis not present

## 2022-01-18 DIAGNOSIS — M9902 Segmental and somatic dysfunction of thoracic region: Secondary | ICD-10-CM | POA: Diagnosis not present

## 2022-01-25 DIAGNOSIS — M9902 Segmental and somatic dysfunction of thoracic region: Secondary | ICD-10-CM | POA: Diagnosis not present

## 2022-01-25 DIAGNOSIS — M5442 Lumbago with sciatica, left side: Secondary | ICD-10-CM | POA: Diagnosis not present

## 2022-01-25 DIAGNOSIS — M4125 Other idiopathic scoliosis, thoracolumbar region: Secondary | ICD-10-CM | POA: Diagnosis not present

## 2022-01-25 DIAGNOSIS — M9904 Segmental and somatic dysfunction of sacral region: Secondary | ICD-10-CM | POA: Diagnosis not present

## 2022-01-25 DIAGNOSIS — M5136 Other intervertebral disc degeneration, lumbar region: Secondary | ICD-10-CM | POA: Diagnosis not present

## 2022-01-25 DIAGNOSIS — M9903 Segmental and somatic dysfunction of lumbar region: Secondary | ICD-10-CM | POA: Diagnosis not present

## 2022-02-01 ENCOUNTER — Encounter: Payer: Self-pay | Admitting: Physician Assistant

## 2022-02-01 DIAGNOSIS — M5442 Lumbago with sciatica, left side: Secondary | ICD-10-CM | POA: Diagnosis not present

## 2022-02-01 DIAGNOSIS — M9903 Segmental and somatic dysfunction of lumbar region: Secondary | ICD-10-CM | POA: Diagnosis not present

## 2022-02-01 DIAGNOSIS — M9904 Segmental and somatic dysfunction of sacral region: Secondary | ICD-10-CM | POA: Diagnosis not present

## 2022-02-01 DIAGNOSIS — G894 Chronic pain syndrome: Secondary | ICD-10-CM

## 2022-02-01 DIAGNOSIS — M4125 Other idiopathic scoliosis, thoracolumbar region: Secondary | ICD-10-CM | POA: Diagnosis not present

## 2022-02-01 DIAGNOSIS — M47819 Spondylosis without myelopathy or radiculopathy, site unspecified: Secondary | ICD-10-CM

## 2022-02-01 DIAGNOSIS — M5136 Other intervertebral disc degeneration, lumbar region: Secondary | ICD-10-CM | POA: Diagnosis not present

## 2022-02-01 DIAGNOSIS — M9902 Segmental and somatic dysfunction of thoracic region: Secondary | ICD-10-CM | POA: Diagnosis not present

## 2022-02-08 ENCOUNTER — Ambulatory Visit: Payer: Medicare Other | Attending: Sports Medicine | Admitting: Physical Therapy

## 2022-02-08 DIAGNOSIS — M5459 Other low back pain: Secondary | ICD-10-CM

## 2022-02-08 DIAGNOSIS — R252 Cramp and spasm: Secondary | ICD-10-CM

## 2022-02-08 DIAGNOSIS — G894 Chronic pain syndrome: Secondary | ICD-10-CM | POA: Insufficient documentation

## 2022-02-08 DIAGNOSIS — M6281 Muscle weakness (generalized): Secondary | ICD-10-CM | POA: Diagnosis present

## 2022-02-08 DIAGNOSIS — M25552 Pain in left hip: Secondary | ICD-10-CM

## 2022-02-08 DIAGNOSIS — M25652 Stiffness of left hip, not elsewhere classified: Secondary | ICD-10-CM | POA: Diagnosis present

## 2022-02-08 DIAGNOSIS — R2689 Other abnormalities of gait and mobility: Secondary | ICD-10-CM | POA: Diagnosis present

## 2022-02-08 DIAGNOSIS — M47819 Spondylosis without myelopathy or radiculopathy, site unspecified: Secondary | ICD-10-CM | POA: Insufficient documentation

## 2022-02-08 NOTE — Therapy (Unsigned)
OUTPATIENT PHYSICAL THERAPY THORACOLUMBAR EVALUATION   Patient Name: Judy Russell MRN: [id number] DOB:1955/12/06, 66 y.o., female Today's Date: 02/08/2022   PT End of Session - 02/08/22 1530     Visit Number 1    Number of Visits 8    Date for PT Re-Evaluation 04/05/22    Authorization Type UHC Medicare    PT Start Time 1530    PT Stop Time 1615    PT Time Calculation (min) 45 min    Activity Tolerance Patient tolerated treatment well    Behavior During Therapy WFL for tasks assessed/performed             Past Medical History:  Diagnosis Date   Allergy    Fibromyalgia    HSV-2 (herpes simplex virus 2) infection    Seizures (HCC)    Thyroid disease    Past Surgical History:  Procedure Laterality Date   APPENDECTOMY     DILATION AND CURETTAGE OF UTERUS     OOPHORECTOMY Left    Patient Active Problem List   Diagnosis Date Noted   Chronic pain syndrome 01/10/2022   Abnormality of globulin 01/10/2022   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 07/27/2021   Fall 07/26/2021   Chronic bilateral low back pain without sciatica 07/25/2021   Syncope 05/31/2021   Acute gastritis without hemorrhage 05/31/2021   Hamstring strain, left, initial encounter 04/28/2021   Bipolar 1 disorder, mixed (HCC) 03/21/2021   No energy 02/28/2021   Lack of motivation 02/28/2021   Rectocele 12/06/2020   Toenail fungus 09/14/2020   Corn of toe 09/14/2020   Tapeworm 04/02/2020   Unintended weight loss 02/18/2020   Loss of taste 02/18/2020   Scalp itch 01/12/2020   Allergic reaction 01/09/2020   Mold exposure 12/24/2019   Chest tightness 12/24/2019   History of COVID-19 10/07/2019   COVID-19 virus infection 09/22/2019   Suspected COVID-19 virus infection 09/11/2019   Seborrheic keratoses 05/14/2019   Food allergy 01/29/2019   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   Chronic constipation 10/30/2018   Cervical radiculopathy 08/21/2018   Tonsillith 07/30/2018   Callus of foot 07/29/2018   Multiple  food allergies 03/25/2018   Environmental allergies 03/25/2018   CFS (chronic fatigue syndrome) 11/27/2017   Perennial allergic rhinitis 08/13/2017   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   Memory changes 07/19/2017   Hyperlipidemia 07/11/2017   Elevated blood pressure reading 07/11/2017   History of colon resection 07/09/2017   History of irregular heartbeat 07/09/2017   Sinus bradycardia by electrocardiogram 07/09/2017   Left leg pain 03/18/2017   Right foot pain 03/18/2017   Menopausal symptoms 07/21/2016   Mood disorder in conditions classified elsewhere 07/21/2016   Plantar wart of left foot 02/29/2016   Dyspareunia in female 07/07/2015   Herpes genitalis in women 07/07/2015   Fibromyalgia 05/05/2015   Primary osteoarthritis involving multiple joints 05/05/2015   Thyroid activity decreased 05/05/2015   Chronic neck pain 05/05/2015   Hypothyroidism 09/04/2009   SEIZURE DISORDER 09/04/2009    PCP: Tandy Gaw  REFERRING PROVIDER: Tandy Gaw  REFERRING DIAG: Back pain  Rationale for Evaluation and Treatment Rehabilitation  THERAPY DIAG:  Stiffness of left hip, not elsewhere classified  Pain in left hip  Other low back pain  Cramp and spasm  Muscle weakness (generalized)  Other abnormalities of gait and mobility  ONSET DATE: Since beginning of 2023  SUBJECTIVE:  SUBJECTIVE STATEMENT: Pt states she's been going to the chiropractor - pt has been getting neck and pelvis adjusted. Has been doing manual work through muscles but not adjusting lumbar spine due to her discs. Insurance no longer paying for chiropractic work. Pt has been continuing maintenance program to help with her hip. Has continued to get her back/hip stuck mostly in the mornings. Has had to increase pain medication.  It gets better for a while but then it will flare up again. Noticed it worsened with weather changes. Has continued to do her exercises from last PT visit.   PERTINENT HISTORY:  Arthritis  PAIN:  Are you having pain? Yes: NPRS scale: 7-8/10 Pain location: L hip/low back Pain description: Achy, tight Aggravating factors: after sleeping in the morning Relieving factors: Increased time   PRECAUTIONS: None  WEIGHT BEARING RESTRICTIONS No  FALLS:  Has patient fallen in last 6 months? No  LIVING ENVIRONMENT: Lives with: lives with their spouse Lives in: House/apartment   OCCUPATION: Retired  PLOF: Independent  PATIENT GOALS Improve dressing ADLs with less pain   OBJECTIVE:   DIAGNOSTIC FINDINGS:  MRI 11/15/21: IMPRESSION: Overall mild multilevel degenerative changes as detailed above without high-grade stenosis. There is marrow edema eccentric to the left at L2-L3 that may be a source reported symptoms.    PATIENT SURVEYS:  FOTO 49; predicted 38  SCREENING FOR RED FLAGS: Bowel or bladder incontinence: No Spinal tumors: No Cauda equina syndrome: No Compression fracture: No Abdominal aneurysm: No  COGNITION:  Overall cognitive status: Within functional limits for tasks assessed     SENSATION: WFL  MUSCLE LENGTH: Hamstrings: Right 80 deg; Left 80 deg Thomas test: To be assessed  POSTURE: forward head and increased thoracic kyphosis  PALPATION: TTP L>R glute med, upper glute max, L lumbar paraspinal Lumbar hypomobility L1-L5 Sacral hypomobility into anterior rotation L>R; posterior rotation WFL  LUMBAR ROM:   Active  A/PROM  eval  Flexion   Extension   Right lateral flexion   Left lateral flexion   Right rotation   Left rotation    (Blank rows = not tested)   LOWER EXTREMITY MMT:    MMT Right eval Left eval  Hip flexion 4+/5 3+/5  Hip extension 3/5 3-/5  Hip abduction 4/5* 3+/5  Hip adduction    Hip internal rotation 4+/5 4+/5  Hip  external rotation 4+/5 4+/5  Knee flexion 5/5 5/5  Knee extension 5/5 5/5  Ankle dorsiflexion    Ankle plantarflexion    Ankle inversion    Ankle eversion     (Blank rows = not tested)  *Difficulty tolerating L sidelying  LUMBAR SPECIAL TESTS:  Straight leg raise test: Negative and SI Compression/distraction test: Negative Unable to actively perform SLR  TRANSFERS:  Sit<>stand utilizing bilat UEs, decrease hip hinge forward   Sup to sit t/f coming up into long sit and then pivoting legs off EOB to keep weight off L hip   TODAY'S TREATMENT  See HEP   PATIENT EDUCATION:  Education details: Exam findings, POC, new stretches for HEP Person educated: Patient Education method: Tourist information centre manager Education comprehension: verbalized understanding   HOME EXERCISE PROGRAM: Access Code [email or web address]  ASSESSMENT:  CLINICAL IMPRESSION: Patient is a 66 y.o. F who was seen today for physical therapy evaluation and treatment for L side hip/back pain. This pain has been chronic in nature since fall in her basement in 2022. On assessment pt is demonstrating L>R hip weakness, sacral and hip joint hypomobility, with  taut and tender L glute and lumbar paraspinals affecting sleep and leisure activities (pt likes to run and had been running prior to the pain). Pt would benefit from PT to address these mobility issues.    OBJECTIVE IMPAIRMENTS Abnormal gait, decreased activity tolerance, decreased mobility, decreased ROM, decreased strength, hypomobility, increased fascial restrictions, increased muscle spasms, impaired flexibility, postural dysfunction, and pain.   ACTIVITY LIMITATIONS squatting, transfers, bed mobility, toileting, dressing, and locomotion level  PARTICIPATION LIMITATIONS: interpersonal relationship and community activity  PERSONAL FACTORS Age, Past/current experiences, and Time since onset of injury/illness/exacerbation are also affecting patient's functional outcome.    REHAB POTENTIAL: Good  CLINICAL DECISION MAKING: Evolving/moderate complexity  EVALUATION COMPLEXITY: Moderate   GOALS: Goals reviewed with patient? Yes  SHORT TERM GOALS: Target date: 03/01/2022  Pt will remain ind with her HEP Baseline: Goal status: INITIAL    LONG TERM GOALS: Target date: 04/05/2022  Pt will be ind with maintaining her HEP Baseline:  Goal status: INITIAL  2.  Pt will demo at least 4+/5 hip flexor and hip extensor strength for improved transfers Baseline:  Goal status: INITIAL  3.  Pt will report decreased pain by >/= 50% Baseline:  Goal status: INITIAL  4.  Pt will have improved FOTO score to at least 58 Baseline:  Goal status: INITIAL  5.  Pt will be able to tolerate L sidelying for improved bed mobility Baseline:  Goal status: INITIAL    PLAN: PT FREQUENCY: 1x/week (per pt request)  PT DURATION: 8 weeks  PLANNED INTERVENTIONS: Therapeutic exercises, Therapeutic activity, Neuromuscular re-education, Balance training, Gait training, Patient/Family education, Self Care, Joint mobilization, Dry Needling, Electrical stimulation, Spinal mobilization, Cryotherapy, Moist heat, Taping, Traction, Ionotophoresis 4mg /ml Dexamethasone, Manual therapy, and Re-evaluation.  PLAN FOR NEXT SESSION: Assess response to HEP. Continue hip flexor and extensor strengthening. Manual work for lumbar mobility.    Judy Russell Judy Russell, PT, DPT 02/08/2022, 4:20 PM

## 2022-02-16 ENCOUNTER — Ambulatory Visit: Payer: Medicare Other | Admitting: Physical Therapy

## 2022-02-16 ENCOUNTER — Encounter: Payer: Self-pay | Admitting: Physical Therapy

## 2022-02-16 DIAGNOSIS — G894 Chronic pain syndrome: Secondary | ICD-10-CM | POA: Diagnosis not present

## 2022-02-16 DIAGNOSIS — M5459 Other low back pain: Secondary | ICD-10-CM

## 2022-02-16 DIAGNOSIS — M25552 Pain in left hip: Secondary | ICD-10-CM | POA: Diagnosis not present

## 2022-02-16 DIAGNOSIS — M47819 Spondylosis without myelopathy or radiculopathy, site unspecified: Secondary | ICD-10-CM | POA: Diagnosis not present

## 2022-02-16 DIAGNOSIS — M6281 Muscle weakness (generalized): Secondary | ICD-10-CM | POA: Diagnosis not present

## 2022-02-16 DIAGNOSIS — R252 Cramp and spasm: Secondary | ICD-10-CM | POA: Diagnosis not present

## 2022-02-16 DIAGNOSIS — M25652 Stiffness of left hip, not elsewhere classified: Secondary | ICD-10-CM

## 2022-02-16 DIAGNOSIS — R2689 Other abnormalities of gait and mobility: Secondary | ICD-10-CM | POA: Diagnosis not present

## 2022-02-16 NOTE — Therapy (Signed)
OUTPATIENT PHYSICAL THERAPY THORACOLUMBAR EVALUATION   Patient Name: Tanicka Brett MRN: [id number] DOB:[birth year], 66 y.o., female Today's Date: 02/16/2022   PT End of Session - 02/16/22 1612     Visit Number 2    Number of Visits 8    Date for PT Re-Evaluation 04/05/22    Authorization Type [employer]    PT Start Time 1615    PT Stop Time 1700    PT Time Calculation (min) 45 min    Activity Tolerance Patient tolerated treatment well    Behavior During Therapy WFL for tasks assessed/performed             Past Medical History:  Diagnosis Date   Allergy    Fibromyalgia    HSV-2 (herpes simplex virus 2) infection    Seizures (HCC)    Thyroid disease    Past Surgical History:  Procedure Laterality Date   APPENDECTOMY     DILATION AND CURETTAGE OF UTERUS     OOPHORECTOMY Left    Patient Active Problem List   Diagnosis Date Noted   Chronic pain syndrome 01/10/2022   Abnormality of globulin 01/10/2022   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 07/27/2021   Fall 07/26/2021   Chronic bilateral low back pain without sciatica 07/25/2021   Syncope 05/31/2021   Acute gastritis without hemorrhage 05/31/2021   Hamstring strain, left, initial encounter 04/28/2021   Bipolar 1 disorder, mixed (HCC) 03/21/2021   No energy 02/28/2021   Lack of motivation 02/28/2021   Rectocele 12/06/2020   Toenail fungus 09/14/2020   Corn of toe 09/14/2020   Tapeworm 04/02/2020   Unintended weight loss 02/18/2020   Loss of taste 02/18/2020   Scalp itch 01/12/2020   Allergic reaction 01/09/2020   Mold exposure 12/24/2019   Chest tightness 12/24/2019   History of COVID-19 10/07/2019   COVID-19 virus infection 09/22/2019   Suspected COVID-19 virus infection 09/11/2019   Seborrheic keratoses 05/14/2019   Food allergy 01/29/2019   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   Chronic constipation 10/30/2018   Cervical radiculopathy 08/21/2018   Tonsillith 07/30/2018   Callus of foot 07/29/2018   Multiple  food allergies 03/25/2018   Environmental allergies 03/25/2018   CFS (chronic fatigue syndrome) 11/27/2017   Perennial allergic rhinitis 08/13/2017   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   Memory changes 07/19/2017   Hyperlipidemia 07/11/2017   Elevated blood pressure reading 07/11/2017   History of colon resection 07/09/2017   History of irregular heartbeat 07/09/2017   Sinus bradycardia by electrocardiogram 07/09/2017   Left leg pain 03/18/2017   Right foot pain 03/18/2017   Menopausal symptoms 07/21/2016   Mood disorder in conditions classified elsewhere 07/21/2016   Plantar wart of left foot 02/29/2016   Dyspareunia in female 07/07/2015   Herpes genitalis in women 07/07/2015   Fibromyalgia 05/05/2015   Primary osteoarthritis involving multiple joints 05/05/2015   Thyroid activity decreased 05/05/2015   Chronic neck pain 05/05/2015   Hypothyroidism 09/04/2009   SEIZURE DISORDER 09/04/2009    PCP: Tandy Gaw  REFERRING PROVIDER: Tandy Gaw  REFERRING DIAG: Back pain  Rationale for Evaluation and Treatment Rehabilitation  THERAPY DIAG:  Stiffness of left hip, not elsewhere classified  Pain in left hip  Other low back pain  Cramp and spasm  Muscle weakness (generalized)  Other abnormalities of gait and mobility  ONSET DATE: Since beginning of 2023  SUBJECTIVE:  SUBJECTIVE STATEMENT: Pt states she went swimming the last weekend. Pt had birthday and family over -- did not have as much time for exercise. Does stretches since at times its the only way she can get moving.   PERTINENT HISTORY:  Arthritis  PAIN:  Are you having pain? Yes: NPRS scale: 7-8/10 Pain location: L hip/low back Pain description: Achy, tight Aggravating factors: after sleeping in the morning Relieving  factors: Increased time   PRECAUTIONS: None  WEIGHT BEARING RESTRICTIONS No  FALLS:  Has patient fallen in last 6 months? No  LIVING ENVIRONMENT: Lives with: lives with their spouse Lives in: House/apartment   OCCUPATION: Retired  PLOF: Independent  PATIENT GOALS Improve dressing ADLs with less pain   OBJECTIVE:   DIAGNOSTIC FINDINGS:  MRI 11/15/21: IMPRESSION: Overall mild multilevel degenerative changes as detailed above without high-grade stenosis. There is marrow edema eccentric to the left at L2-L3 that may be a source reported symptoms.    PATIENT SURVEYS:  FOTO 49; predicted 61  SCREENING FOR RED FLAGS: Bowel or bladder incontinence: No Spinal tumors: No Cauda equina syndrome: No Compression fracture: No Abdominal aneurysm: No  COGNITION:  Overall cognitive status: Within functional limits for tasks assessed     SENSATION: WFL  MUSCLE LENGTH: Hamstrings: Right 80 deg; Left 80 deg Thomas test: To be assessed  POSTURE: forward head and increased thoracic kyphosis  PALPATION: TTP L>R glute med, upper glute max, L lumbar paraspinal Lumbar hypomobility L1-L5 Sacral hypomobility into anterior rotation L>R; posterior rotation WFL    LOWER EXTREMITY MMT:    MMT Right eval Left eval  Hip flexion 4+/5 3+/5  Hip extension 3/5 3-/5  Hip abduction 4/5* 3+/5  Hip adduction    Hip internal rotation 4+/5 4+/5  Hip external rotation 4+/5 4+/5  Knee flexion 5/5 5/5  Knee extension 5/5 5/5  Ankle dorsiflexion    Ankle plantarflexion    Ankle inversion    Ankle eversion     (Blank rows = not tested)  *Difficulty tolerating L sidelying  LUMBAR SPECIAL TESTS:  Straight leg raise test: Negative and SI Compression/distraction test: Negative Unable to actively perform SLR  TRANSFERS:  Sit<>stand utilizing bilat UEs, decrease hip hinge forward   Sup to sit t/f coming up into long sit and then pivoting legs off EOB to keep weight off L  hip   TODAY'S TREATMENT  02/16/22 Treadmill warm up 1.9 mph x 6 min Prone:  Hip flexor stretch x30 sec (reported increased burning into L glute)  Hip extensor AAROM x5  "I" 2x10  "W" 2x10  Manual therapy:  STM & TPR thoracic paraspinals and periscapular muscles  Manual stretch for lats/lateral trunk flexors  Skilled assessment and palpation for TPDN  Trigger Point Dry-Needling  Treatment instructions: Expect mild to moderate muscle soreness. S/S of pneumothorax if dry needled over a lung field, and to seek immediate medical attention should they occur. Patient verbalized understanding of these instructions and education.  Patient Consent Given: Yes Education handout provided: Previously provided Muscles treated: L mid/low trap Electrical stimulation performed: No Parameters: N/A Treatment response/outcome: Twitch response illicited, increased palpable muscle length  Supine:  DKTC with lat stretch 2x30 sec (PT assist)  Knee slightly bent, leg raise eccentrics 2x10 (PT assist)       PATIENT EDUCATION:  Education details: Reviewed TPDN instructions (see above), new HEP Person educated: Patient Education method: Tourist information centre manager Education comprehension: verbalized understanding   HOME EXERCISE PROGRAM: Access Code [email or web address]  ASSESSMENT:  CLINICAL IMPRESSION: Treatment session focused on improving thoracolumbar mobility. Performed TPDN of L periscapular muscles. Worked on lat mobility/stretching this session as this may be effecting her pelvis mobility. Initiated strengthening of quads/hip flexors (pt is unable to perform concentric SLR).    OBJECTIVE IMPAIRMENTS Abnormal gait, decreased activity tolerance, decreased mobility, decreased ROM, decreased strength, hypomobility, increased fascial restrictions, increased muscle spasms, impaired flexibility, postural dysfunction, and pain.   ACTIVITY LIMITATIONS squatting, transfers, bed mobility, toileting, dressing,  and locomotion level  PARTICIPATION LIMITATIONS: interpersonal relationship and community activity  PERSONAL FACTORS Age, Past/current experiences, and Time since onset of injury/illness/exacerbation are also affecting patient's functional outcome.   REHAB POTENTIAL: Good  CLINICAL DECISION MAKING: Evolving/moderate complexity  EVALUATION COMPLEXITY: Moderate   GOALS: Goals reviewed with patient? Yes  SHORT TERM GOALS: Target date: 03/09/2022  Pt will remain ind with her HEP Baseline: Goal status: INITIAL    LONG TERM GOALS: Target date: 04/05/2022  Pt will be ind with maintaining her HEP Baseline:  Goal status: INITIAL  2.  Pt will demo at least 4+/5 hip flexor and hip extensor strength for improved transfers Baseline:  Goal status: INITIAL  3.  Pt will report decreased pain by >/= 50% Baseline:  Goal status: INITIAL  4.  Pt will have improved FOTO score to at least 58 Baseline:  Goal status: INITIAL  5.  Pt will be able to tolerate L sidelying for improved bed mobility Baseline:  Goal status: INITIAL    PLAN: PT FREQUENCY: 1x/week (per pt request)  PT DURATION: 8 weeks  PLANNED INTERVENTIONS: Therapeutic exercises, Therapeutic activity, Neuromuscular re-education, Balance training, Gait training, Patient/Family education, Self Care, Joint mobilization, Dry Needling, Electrical stimulation, Spinal mobilization, Cryotherapy, Moist heat, Taping, Traction, Ionotophoresis 4mg /ml Dexamethasone, Manual therapy, and Re-evaluation.  PLAN FOR NEXT SESSION: Assess response to HEP. Continue hip flexor and extensor strengthening. Manual work for thoraco lumbar mobility.    Shani Fitch April Ma L Emmajean Ratledge, PT, DPT 02/16/2022, 4:12 PM

## 2022-02-22 ENCOUNTER — Ambulatory Visit: Payer: Medicare Other | Admitting: Physical Therapy

## 2022-02-22 ENCOUNTER — Encounter: Payer: Self-pay | Admitting: Physical Therapy

## 2022-02-22 ENCOUNTER — Encounter: Payer: Self-pay | Admitting: General Practice

## 2022-02-22 DIAGNOSIS — M6281 Muscle weakness (generalized): Secondary | ICD-10-CM

## 2022-02-22 DIAGNOSIS — R2689 Other abnormalities of gait and mobility: Secondary | ICD-10-CM | POA: Diagnosis not present

## 2022-02-22 DIAGNOSIS — M25652 Stiffness of left hip, not elsewhere classified: Secondary | ICD-10-CM | POA: Diagnosis not present

## 2022-02-22 DIAGNOSIS — H9193 Unspecified hearing loss, bilateral: Secondary | ICD-10-CM

## 2022-02-22 DIAGNOSIS — G894 Chronic pain syndrome: Secondary | ICD-10-CM | POA: Diagnosis not present

## 2022-02-22 DIAGNOSIS — R252 Cramp and spasm: Secondary | ICD-10-CM | POA: Diagnosis not present

## 2022-02-22 DIAGNOSIS — M5459 Other low back pain: Secondary | ICD-10-CM

## 2022-02-22 DIAGNOSIS — M47819 Spondylosis without myelopathy or radiculopathy, site unspecified: Secondary | ICD-10-CM | POA: Diagnosis not present

## 2022-02-22 DIAGNOSIS — M25552 Pain in left hip: Secondary | ICD-10-CM

## 2022-02-22 NOTE — Therapy (Signed)
OUTPATIENT PHYSICAL THERAPY TREATMENT   Patient Name: Judy Russell MRN: [id number] DOB:1955/11/28, 66 y.o., female Today's Date: 02/22/2022   PT End of Session - 02/22/22 1404     Visit Number 3    Number of Visits 8    Date for PT Re-Evaluation 04/05/22    Authorization Type [employer] Medicare    PT Start Time 1404    PT Stop Time 1445    PT Time Calculation (min) 41 min    Activity Tolerance Patient tolerated treatment well    Behavior During Therapy WFL for tasks assessed/performed             Past Medical History:  Diagnosis Date   Allergy    Fibromyalgia    HSV-2 (herpes simplex virus 2) infection    Seizures (HCC)    Thyroid disease    Past Surgical History:  Procedure Laterality Date   APPENDECTOMY     DILATION AND CURETTAGE OF UTERUS     OOPHORECTOMY Left    Patient Active Problem List   Diagnosis Date Noted   Chronic pain syndrome 01/10/2022   Abnormality of globulin 01/10/2022   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 07/27/2021   Fall 07/26/2021   Chronic bilateral low back pain without sciatica 07/25/2021   Syncope 05/31/2021   Acute gastritis without hemorrhage 05/31/2021   Hamstring strain, left, initial encounter 04/28/2021   Bipolar 1 disorder, mixed (HCC) 03/21/2021   No energy 02/28/2021   Lack of motivation 02/28/2021   Rectocele 12/06/2020   Toenail fungus 09/14/2020   Corn of toe 09/14/2020   Tapeworm 04/02/2020   Unintended weight loss 02/18/2020   Loss of taste 02/18/2020   Scalp itch 01/12/2020   Allergic reaction 01/09/2020   Mold exposure 12/24/2019   Chest tightness 12/24/2019   History of COVID-19 10/07/2019   COVID-19 virus infection 09/22/2019   Suspected COVID-19 virus infection 09/11/2019   Seborrheic keratoses 05/14/2019   Food allergy 01/29/2019   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   Chronic constipation 10/30/2018   Cervical radiculopathy 08/21/2018   Tonsillith 07/30/2018   Callus of foot 07/29/2018   Multiple food allergies  03/25/2018   Environmental allergies 03/25/2018   CFS (chronic fatigue syndrome) 11/27/2017   Perennial allergic rhinitis 08/13/2017   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   Memory changes 07/19/2017   Hyperlipidemia 07/11/2017   Elevated blood pressure reading 07/11/2017   History of colon resection 07/09/2017   History of irregular heartbeat 07/09/2017   Sinus bradycardia by electrocardiogram 07/09/2017   Left leg pain 03/18/2017   Right foot pain 03/18/2017   Menopausal symptoms 07/21/2016   Mood disorder in conditions classified elsewhere 07/21/2016   Plantar wart of left foot 02/29/2016   Dyspareunia in female 07/07/2015   Herpes genitalis in women 07/07/2015   Fibromyalgia 05/05/2015   Primary osteoarthritis involving multiple joints 05/05/2015   Thyroid activity decreased 05/05/2015   Chronic neck pain 05/05/2015   Hypothyroidism 09/04/2009   SEIZURE DISORDER 09/04/2009    PCP: Tandy Gaw  REFERRING PROVIDER: Tandy Gaw  REFERRING DIAG: Back pain  Rationale for Evaluation and Treatment Rehabilitation  THERAPY DIAG:  Stiffness of left hip, not elsewhere classified  Pain in left hip  Other low back pain  Cramp and spasm  Muscle weakness (generalized)  Other abnormalities of gait and mobility  ONSET DATE: Since beginning of 2023  SUBJECTIVE:  SUBJECTIVE STATEMENT: Pt states that she's done her exercises as much as she can. Did her lat stretch with her husband's assistance. Pt reports she was very sore after last session but did feel looser.   PERTINENT HISTORY:  Arthritis  PAIN:  Are you having pain? Yes: NPRS scale: 7-8/10 Pain location: L hip/low back Pain description: Achy, tight Aggravating factors: after sleeping in the morning Relieving factors: Increased  time   PRECAUTIONS: None  WEIGHT BEARING RESTRICTIONS No  FALLS:  Has patient fallen in last 6 months? No  LIVING ENVIRONMENT: Lives with: lives with their spouse Lives in: House/apartment   OCCUPATION: Retired  PLOF: Independent  PATIENT GOALS Improve dressing ADLs with less pain   OBJECTIVE:   DIAGNOSTIC FINDINGS:  MRI 11/15/21: IMPRESSION: Overall mild multilevel degenerative changes as detailed above without high-grade stenosis. There is marrow edema eccentric to the left at L2-L3 that may be a source reported symptoms.    PATIENT SURVEYS:  FOTO 49; predicted 46  SCREENING FOR RED FLAGS: Bowel or bladder incontinence: No Spinal tumors: No Cauda equina syndrome: No Compression fracture: No Abdominal aneurysm: No  COGNITION:  Overall cognitive status: Within functional limits for tasks assessed     SENSATION: WFL  MUSCLE LENGTH: Hamstrings: Right 80 deg; Left 80 deg Thomas test: To be assessed  POSTURE: forward head and increased thoracic kyphosis  PALPATION: TTP L>R glute med, upper glute max, L lumbar paraspinal Lumbar hypomobility L1-L5 Sacral hypomobility into anterior rotation L>R; posterior rotation WFL    LOWER EXTREMITY MMT:    MMT Right eval Left eval  Hip flexion 4+/5 3+/5  Hip extension 3/5 3-/5  Hip abduction 4/5* 3+/5  Hip adduction    Hip internal rotation 4+/5 4+/5  Hip external rotation 4+/5 4+/5  Knee flexion 5/5 5/5  Knee extension 5/5 5/5  Ankle dorsiflexion    Ankle plantarflexion    Ankle inversion    Ankle eversion     (Blank rows = not tested)  *Difficulty tolerating L sidelying  LUMBAR SPECIAL TESTS:  Straight leg raise test: Negative and SI Compression/distraction test: Negative Unable to actively perform SLR  TRANSFERS:  Sit<>stand utilizing bilat UEs, decrease hip hinge forward   Sup to sit t/f coming up into long sit and then pivoting legs off EOB to keep weight off L hip   TODAY'S TREATMENT   02/22/22 Treadmill warm up 1.9 mph x 5 min  Manual therapy:  STM & TPR L glute, TSA, and QL  PROM hip ext, abd, IR/ER Therex:  Supine     DKTC with lat stretch 2x30 sec (PT assist)   Clamshell green tband 2x10 L&R   Marching green tband 2x10 L&R   SLR 2x10 L&R  Prone:   Hip flexor stretch x30 sec PT assist   Hip extension x10 R&L     02/16/22 Treadmill warm up 1.9 mph x 6 min Prone:  Hip flexor stretch x30 sec (reported increased burning into L glute)  Hip extensor AAROM x5  "I" 2x10  "W" 2x10  Manual therapy:  STM & TPR thoracic paraspinals and periscapular muscles  Manual stretch for lats/lateral trunk flexors  Skilled assessment and palpation for TPDN  Trigger Point Dry-Needling  Treatment instructions: Expect mild to moderate muscle soreness. S/S of pneumothorax if dry needled over a lung field, and to seek immediate medical attention should they occur. Patient verbalized understanding of these instructions and education.  Patient Consent Given: Yes Education handout provided: Previously provided Muscles treated: L  mid/low trap Electrical stimulation performed: No Parameters: N/A Treatment response/outcome: Twitch response illicited, increased palpable muscle length  Supine:  DKTC with lat stretch 2x30 sec (PT assist)  Knee slightly bent, leg raise eccentrics 2x10 (PT assist)       PATIENT EDUCATION:  Education details: Reviewed TPDN instructions (see above), new HEP Person educated: Patient Education method: Tourist information centre manager Education comprehension: verbalized understanding   HOME EXERCISE PROGRAM: Access Code [email or web address]  ASSESSMENT:  CLINICAL IMPRESSION: Pt had good response to TPDN last session. Defers trialing needling glute and L lumbosacral area at this time. Worked on improving hip mobility and strength.    OBJECTIVE IMPAIRMENTS Abnormal gait, decreased activity tolerance, decreased mobility, decreased ROM, decreased strength,  hypomobility, increased fascial restrictions, increased muscle spasms, impaired flexibility, postural dysfunction, and pain.   ACTIVITY LIMITATIONS squatting, transfers, bed mobility, toileting, dressing, and locomotion level  PARTICIPATION LIMITATIONS: interpersonal relationship and community activity  PERSONAL FACTORS Age, Past/current experiences, and Time since onset of injury/illness/exacerbation are also affecting patient's functional outcome.   REHAB POTENTIAL: Good  CLINICAL DECISION MAKING: Evolving/moderate complexity  EVALUATION COMPLEXITY: Moderate   GOALS: Goals reviewed with patient? Yes  SHORT TERM GOALS: Target date: 03/09/2022  Pt will remain ind with her HEP Baseline: Goal status: IN PROGRESS    LONG TERM GOALS: Target date: 04/05/2022  Pt will be ind with maintaining her HEP Baseline:  Goal status: IN PROGRESS  2.  Pt will demo at least 4+/5 hip flexor and hip extensor strength for improved transfers Baseline:  Goal status: IN PROGRESS  3.  Pt will report decreased pain by >/= 50% Baseline:  Goal status: IN PROGRESS  4.  Pt will have improved FOTO score to at least 58 Baseline:  Goal status: IN PROGRESS  5.  Pt will be able to tolerate L sidelying for improved bed mobility Baseline:  Goal status: IN PROGRESS    PLAN: PT FREQUENCY: 1x/week (per pt request)  PT DURATION: 8 weeks  PLANNED INTERVENTIONS: Therapeutic exercises, Therapeutic activity, Neuromuscular re-education, Balance training, Gait training, Patient/Family education, Self Care, Joint mobilization, Dry Needling, Electrical stimulation, Spinal mobilization, Cryotherapy, Moist heat, Taping, Traction, Ionotophoresis 4mg /ml Dexamethasone, Manual therapy, and Re-evaluation.  PLAN FOR NEXT SESSION: Assess response to HEP. Continue hip flexor and extensor strengthening. Manual work for thoraco lumbar mobility.    Raya Mckinstry April Ma L Landrie Beale, PT, DPT 02/22/2022, 2:05 PM

## 2022-03-01 ENCOUNTER — Encounter: Payer: Self-pay | Admitting: Physical Therapy

## 2022-03-01 ENCOUNTER — Ambulatory Visit: Payer: Medicare Other | Admitting: Physical Therapy

## 2022-03-01 DIAGNOSIS — M6281 Muscle weakness (generalized): Secondary | ICD-10-CM | POA: Diagnosis not present

## 2022-03-01 DIAGNOSIS — R2689 Other abnormalities of gait and mobility: Secondary | ICD-10-CM | POA: Diagnosis not present

## 2022-03-01 DIAGNOSIS — M25652 Stiffness of left hip, not elsewhere classified: Secondary | ICD-10-CM | POA: Diagnosis not present

## 2022-03-01 DIAGNOSIS — M25552 Pain in left hip: Secondary | ICD-10-CM

## 2022-03-01 DIAGNOSIS — R252 Cramp and spasm: Secondary | ICD-10-CM | POA: Diagnosis not present

## 2022-03-01 DIAGNOSIS — M5459 Other low back pain: Secondary | ICD-10-CM

## 2022-03-01 DIAGNOSIS — M47819 Spondylosis without myelopathy or radiculopathy, site unspecified: Secondary | ICD-10-CM | POA: Diagnosis not present

## 2022-03-01 DIAGNOSIS — G894 Chronic pain syndrome: Secondary | ICD-10-CM | POA: Diagnosis not present

## 2022-03-01 NOTE — Therapy (Signed)
OUTPATIENT PHYSICAL THERAPY TREATMENT   Patient Name: Judy Russell MRN: [id number] DOB:[birth year], 66 y.o., female Today's Date: 03/01/2022   PT End of Session - 03/01/22 1355     Visit Number 4    Number of Visits 8    Date for PT Re-Evaluation 04/05/22    Authorization Type [employer]    PT Start Time 1400    PT Stop Time 1445    PT Time Calculation (min) 45 min    Activity Tolerance Patient tolerated treatment well    Behavior During Therapy WFL for tasks assessed/performed             Past Medical History:  Diagnosis Date   Allergy    Fibromyalgia    HSV-2 (herpes simplex virus 2) infection    Seizures (HCC)    Thyroid disease    Past Surgical History:  Procedure Laterality Date   APPENDECTOMY     DILATION AND CURETTAGE OF UTERUS     OOPHORECTOMY Left    Patient Active Problem List   Diagnosis Date Noted   Chronic pain syndrome 01/10/2022   Abnormality of globulin 01/10/2022   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 07/27/2021   Fall 07/26/2021   Chronic bilateral low back pain without sciatica 07/25/2021   Syncope 05/31/2021   Acute gastritis without hemorrhage 05/31/2021   Hamstring strain, left, initial encounter 04/28/2021   Bipolar 1 disorder, mixed (HCC) 03/21/2021   No energy 02/28/2021   Lack of motivation 02/28/2021   Rectocele 12/06/2020   Toenail fungus 09/14/2020   Corn of toe 09/14/2020   Tapeworm 04/02/2020   Unintended weight loss 02/18/2020   Loss of taste 02/18/2020   Scalp itch 01/12/2020   Allergic reaction 01/09/2020   Mold exposure 12/24/2019   Chest tightness 12/24/2019   History of COVID-19 10/07/2019   COVID-19 virus infection 09/22/2019   Suspected COVID-19 virus infection 09/11/2019   Seborrheic keratoses 05/14/2019   Food allergy 01/29/2019   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   Chronic constipation 10/30/2018   Cervical radiculopathy 08/21/2018   Tonsillith 07/30/2018   Callus of foot 07/29/2018   Multiple food allergies  03/25/2018   Environmental allergies 03/25/2018   CFS (chronic fatigue syndrome) 11/27/2017   Perennial allergic rhinitis 08/13/2017   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   Memory changes 07/19/2017   Hyperlipidemia 07/11/2017   Elevated blood pressure reading 07/11/2017   History of colon resection 07/09/2017   History of irregular heartbeat 07/09/2017   Sinus bradycardia by electrocardiogram 07/09/2017   Left leg pain 03/18/2017   Right foot pain 03/18/2017   Menopausal symptoms 07/21/2016   Mood disorder in conditions classified elsewhere 07/21/2016   Plantar wart of left foot 02/29/2016   Dyspareunia in female 07/07/2015   Herpes genitalis in women 07/07/2015   Fibromyalgia 05/05/2015   Primary osteoarthritis involving multiple joints 05/05/2015   Thyroid activity decreased 05/05/2015   Chronic neck pain 05/05/2015   Hypothyroidism 09/04/2009   SEIZURE DISORDER 09/04/2009    PCP: Tandy Gaw  REFERRING PROVIDER: Tandy Gaw  REFERRING DIAG: Back pain  Rationale for Evaluation and Treatment Rehabilitation  THERAPY DIAG:  Stiffness of left hip, not elsewhere classified  Pain in left hip  Other low back pain  Cramp and spasm  Muscle weakness (generalized)  Other abnormalities of gait and mobility  ONSET DATE: Since beginning of 2023  SUBJECTIVE:  SUBJECTIVE STATEMENT: 8/30: Pt states she "got stuck" this morning at around 1 am. Thinks it may be due to the weather. Has been doing exercises.   8/23: Pt states that she's done her exercises as much as she can. Did her lat stretch with her husband's assistance. Pt reports she was very sore after last session but did feel looser.   PERTINENT HISTORY:  Arthritis  PAIN:  Are you having pain? Yes: NPRS scale: 7-8/10 Pain  location: L hip/low back Pain description: Achy, tight Aggravating factors: after sleeping in the morning Relieving factors: Increased time   PRECAUTIONS: None  WEIGHT BEARING RESTRICTIONS No  FALLS:  Has patient fallen in last 6 months? No  LIVING ENVIRONMENT: Lives with: lives with their spouse Lives in: House/apartment   OCCUPATION: Retired  PLOF: Independent  PATIENT GOALS Improve dressing ADLs with less pain   OBJECTIVE:   DIAGNOSTIC FINDINGS:  MRI 11/15/21: IMPRESSION: Overall mild multilevel degenerative changes as detailed above without high-grade stenosis. There is marrow edema eccentric to the left at L2-L3 that may be a source reported symptoms.    PATIENT SURVEYS:  FOTO 49; predicted 57  SCREENING FOR RED FLAGS: Bowel or bladder incontinence: No Spinal tumors: No Cauda equina syndrome: No Compression fracture: No Abdominal aneurysm: No  COGNITION:  Overall cognitive status: Within functional limits for tasks assessed     SENSATION: WFL  MUSCLE LENGTH: Hamstrings: Right 80 deg; Left 80 deg Thomas test: To be assessed  POSTURE: forward head and increased thoracic kyphosis  PALPATION: TTP L>R glute med, upper glute max, L lumbar paraspinal Lumbar hypomobility L1-L5 Sacral hypomobility into anterior rotation L>R; posterior rotation WFL    LOWER EXTREMITY MMT:    MMT Right eval Left eval  Hip flexion 4+/5 3+/5  Hip extension 3/5 3-/5  Hip abduction 4/5* 3+/5  Hip adduction    Hip internal rotation 4+/5 4+/5  Hip external rotation 4+/5 4+/5  Knee flexion 5/5 5/5  Knee extension 5/5 5/5  Ankle dorsiflexion    Ankle plantarflexion    Ankle inversion    Ankle eversion     (Blank rows = not tested)  *Difficulty tolerating L sidelying  LUMBAR SPECIAL TESTS:  Straight leg raise test: Negative and SI Compression/distraction test: Negative Unable to actively perform SLR  TRANSFERS:  Sit<>stand utilizing bilat UEs, decrease hip  hinge forward   Sup to sit t/f coming up into long sit and then pivoting legs off EOB to keep weight off L hip   TODAY'S TREATMENT  03/01/22 Treadmill warm up 1.6 mph x 6 min  Manual therapy:  STM & TPR thoracolumbar paraspinals, rhomboids, low traps, L glute, L TSA, and QL  PROM hip ext, abd, IR/ER  Therex:  Prone   "I" 2x10   "W" 2x10   "Y" 2x10   Hip ext 2x10 R&L eccentrics   Bent knee fall outs x10  Supine   LTR x10   Clamshell green TB x10; blue TB x10  Standing   Lateral hip shift into wall x30 sec  Ionto 4 hour patch with dexa on L TSA   02/22/22 Treadmill warm up 1.9 mph x 5 min  Manual therapy:  STM & TPR L glute, TSA, and QL  PROM hip ext, abd, IR/ER Therex:  Supine     DKTC with lat stretch 2x30 sec (PT assist)   Clamshell green tband 2x10 L&R   Marching green tband 2x10 L&R   SLR 2x10 L&R  Prone:   Hip  flexor stretch x30 sec PT assist   Hip extension x10 R&L     02/16/22 Treadmill warm up 1.9 mph x 6 min Prone:  Hip flexor stretch x30 sec (reported increased burning into L glute)  Hip extensor AAROM x5  "I" 2x10  "W" 2x10  Manual therapy:  STM & TPR thoracic paraspinals and periscapular muscles  Manual stretch for lats/lateral trunk flexors  Skilled assessment and palpation for TPDN  Trigger Point Dry-Needling  Treatment instructions: Expect mild to moderate muscle soreness. S/S of pneumothorax if dry needled over a lung field, and to seek immediate medical attention should they occur. Patient verbalized understanding of these instructions and education.  Patient Consent Given: Yes Education handout provided: Previously provided Muscles treated: L mid/low trap Electrical stimulation performed: No Parameters: N/A Treatment response/outcome: Twitch response illicited, increased palpable muscle length  Supine:  DKTC with lat stretch 2x30 sec (PT assist)  Knee slightly bent, leg raise eccentrics 2x10 (PT assist)       PATIENT EDUCATION:   Education details: Reviewed TPDN instructions (see above), new HEP Person educated: Patient Education method: Tourist information centre manager Education comprehension: verbalized understanding   HOME EXERCISE PROGRAM: Access Code [email or web address]  ASSESSMENT:  CLINICAL IMPRESSION: Increased stiffness and pain this session due to weather. Continued to progress strengthening. Performed trial of ionto to address her L lateral hip/side pain.    OBJECTIVE IMPAIRMENTS Abnormal gait, decreased activity tolerance, decreased mobility, decreased ROM, decreased strength, hypomobility, increased fascial restrictions, increased muscle spasms, impaired flexibility, postural dysfunction, and pain.   ACTIVITY LIMITATIONS squatting, transfers, bed mobility, toileting, dressing, and locomotion level  PARTICIPATION LIMITATIONS: interpersonal relationship and community activity  PERSONAL FACTORS Age, Past/current experiences, and Time since onset of injury/illness/exacerbation are also affecting patient's functional outcome.   REHAB POTENTIAL: Good  CLINICAL DECISION MAKING: Evolving/moderate complexity  EVALUATION COMPLEXITY: Moderate   GOALS: Goals reviewed with patient? Yes  SHORT TERM GOALS: Target date: 03/09/2022  Pt will remain ind with her HEP Baseline: Goal status: IN PROGRESS    LONG TERM GOALS: Target date: 04/05/2022  Pt will be ind with maintaining her HEP Baseline:  Goal status: IN PROGRESS  2.  Pt will demo at least 4+/5 hip flexor and hip extensor strength for improved transfers Baseline:  Goal status: IN PROGRESS  3.  Pt will report decreased pain by >/= 50% Baseline:  Goal status: IN PROGRESS  4.  Pt will have improved FOTO score to at least 58 Baseline:  Goal status: IN PROGRESS  5.  Pt will be able to tolerate L sidelying for improved bed mobility Baseline:  Goal status: IN PROGRESS    PLAN: PT FREQUENCY: 1x/week (per pt request)  PT DURATION: 8 weeks  PLANNED  INTERVENTIONS: Therapeutic exercises, Therapeutic activity, Neuromuscular re-education, Balance training, Gait training, Patient/Family education, Self Care, Joint mobilization, Dry Needling, Electrical stimulation, Spinal mobilization, Cryotherapy, Moist heat, Taping, Traction, Ionotophoresis 4mg /ml Dexamethasone, Manual therapy, and Re-evaluation.  PLAN FOR NEXT SESSION: Assess response to HEP. Continue hip flexor and extensor strengthening. Manual work for thoraco lumbar mobility.    Divine Hansley April Ma L Seibert Keeter, PT, DPT 03/01/2022, 3:35 PM

## 2022-03-09 ENCOUNTER — Ambulatory Visit: Payer: Medicare Other | Attending: Sports Medicine | Admitting: Physical Therapy

## 2022-03-09 ENCOUNTER — Encounter: Payer: Self-pay | Admitting: Physical Therapy

## 2022-03-09 DIAGNOSIS — M5459 Other low back pain: Secondary | ICD-10-CM

## 2022-03-09 DIAGNOSIS — R2689 Other abnormalities of gait and mobility: Secondary | ICD-10-CM

## 2022-03-09 DIAGNOSIS — M6281 Muscle weakness (generalized): Secondary | ICD-10-CM

## 2022-03-09 DIAGNOSIS — M25652 Stiffness of left hip, not elsewhere classified: Secondary | ICD-10-CM | POA: Diagnosis present

## 2022-03-09 DIAGNOSIS — R252 Cramp and spasm: Secondary | ICD-10-CM

## 2022-03-09 DIAGNOSIS — M25552 Pain in left hip: Secondary | ICD-10-CM | POA: Diagnosis present

## 2022-03-09 NOTE — Therapy (Signed)
OUTPATIENT PHYSICAL THERAPY TREATMENT   Patient Name: Judy Russell MRN: [id number] DOB:June 18, 1956, 66 y.o., female Today's Date: 03/09/2022   PT End of Session - 03/09/22 0933     Visit Number 5    Number of Visits 8    Date for PT Re-Evaluation 04/05/22    Authorization Type [employer] Medicare    PT Start Time 0933    PT Stop Time 1015    PT Time Calculation (min) 42 min    Activity Tolerance Patient tolerated treatment well    Behavior During Therapy WFL for tasks assessed/performed             Past Medical History:  Diagnosis Date   Allergy    Fibromyalgia    HSV-2 (herpes simplex virus 2) infection    Seizures (HCC)    Thyroid disease    Past Surgical History:  Procedure Laterality Date   APPENDECTOMY     DILATION AND CURETTAGE OF UTERUS     OOPHORECTOMY Left    Patient Active Problem List   Diagnosis Date Noted   Chronic pain syndrome 01/10/2022   Abnormality of globulin 01/10/2022   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 07/27/2021   Fall 07/26/2021   Chronic bilateral low back pain without sciatica 07/25/2021   Syncope 05/31/2021   Acute gastritis without hemorrhage 05/31/2021   Hamstring strain, left, initial encounter 04/28/2021   Bipolar 1 disorder, mixed (HCC) 03/21/2021   No energy 02/28/2021   Lack of motivation 02/28/2021   Rectocele 12/06/2020   Toenail fungus 09/14/2020   Corn of toe 09/14/2020   Tapeworm 04/02/2020   Unintended weight loss 02/18/2020   Loss of taste 02/18/2020   Scalp itch 01/12/2020   Allergic reaction 01/09/2020   Mold exposure 12/24/2019   Chest tightness 12/24/2019   History of COVID-19 10/07/2019   COVID-19 virus infection 09/22/2019   Suspected COVID-19 virus infection 09/11/2019   Seborrheic keratoses 05/14/2019   Food allergy 01/29/2019   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   Chronic constipation 10/30/2018   Cervical radiculopathy 08/21/2018   Tonsillith 07/30/2018   Callus of foot 07/29/2018   Multiple food allergies  03/25/2018   Environmental allergies 03/25/2018   CFS (chronic fatigue syndrome) 11/27/2017   Perennial allergic rhinitis 08/13/2017   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   Memory changes 07/19/2017   Hyperlipidemia 07/11/2017   Elevated blood pressure reading 07/11/2017   History of colon resection 07/09/2017   History of irregular heartbeat 07/09/2017   Sinus bradycardia by electrocardiogram 07/09/2017   Left leg pain 03/18/2017   Right foot pain 03/18/2017   Menopausal symptoms 07/21/2016   Mood disorder in conditions classified elsewhere 07/21/2016   Plantar wart of left foot 02/29/2016   Dyspareunia in female 07/07/2015   Herpes genitalis in women 07/07/2015   Fibromyalgia 05/05/2015   Primary osteoarthritis involving multiple joints 05/05/2015   Thyroid activity decreased 05/05/2015   Chronic neck pain 05/05/2015   Hypothyroidism 09/04/2009   SEIZURE DISORDER 09/04/2009    PCP: Tandy Gaw  REFERRING PROVIDER: Tandy Gaw  REFERRING DIAG: Back pain  Rationale for Evaluation and Treatment Rehabilitation  THERAPY DIAG:  Stiffness of left hip, not elsewhere classified  Pain in left hip  Other low back pain  Cramp and spasm  Muscle weakness (generalized)  Other abnormalities of gait and mobility  ONSET DATE: Since beginning of 2023  SUBJECTIVE:  SUBJECTIVE STATEMENT: 9/7: Pt reports she is feeling very tired this morning -- cat woke her up. Pt states she was canning all day yesterday.    PERTINENT HISTORY:  Arthritis  PAIN:  Are you having pain? Yes: NPRS scale: 7-8/10 Pain location: L hip/low back Pain description: Achy, tight Aggravating factors: after sleeping in the morning Relieving factors: Increased time   PRECAUTIONS: None  WEIGHT BEARING RESTRICTIONS  No  FALLS:  Has patient fallen in last 6 months? No  LIVING ENVIRONMENT: Lives with: lives with their spouse Lives in: House/apartment   OCCUPATION: Retired  PLOF: Independent  PATIENT GOALS Improve dressing ADLs with less pain   OBJECTIVE:   DIAGNOSTIC FINDINGS:  MRI 11/15/21: IMPRESSION: Overall mild multilevel degenerative changes as detailed above without high-grade stenosis. There is marrow edema eccentric to the left at L2-L3 that may be a source reported symptoms.    PATIENT SURVEYS:  FOTO 49; predicted 78  SCREENING FOR RED FLAGS: Bowel or bladder incontinence: No Spinal tumors: No Cauda equina syndrome: No Compression fracture: No Abdominal aneurysm: No  COGNITION:  Overall cognitive status: Within functional limits for tasks assessed     SENSATION: WFL  MUSCLE LENGTH: Hamstrings: Right 80 deg; Left 80 deg Thomas test: To be assessed  POSTURE: forward head and increased thoracic kyphosis  PALPATION: TTP L>R glute med, upper glute max, L lumbar paraspinal Lumbar hypomobility L1-L5 Sacral hypomobility into anterior rotation L>R; posterior rotation WFL    LOWER EXTREMITY MMT:    MMT Right eval Left eval  Hip flexion 4+/5 3+/5  Hip extension 3/5 3-/5  Hip abduction 4/5* 3+/5  Hip adduction    Hip internal rotation 4+/5 4+/5  Hip external rotation 4+/5 4+/5  Knee flexion 5/5 5/5  Knee extension 5/5 5/5  Ankle dorsiflexion    Ankle plantarflexion    Ankle inversion    Ankle eversion     (Blank rows = not tested)  *Difficulty tolerating L sidelying  LUMBAR SPECIAL TESTS:  Straight leg raise test: Negative and SI Compression/distraction test: Negative Unable to actively perform SLR  TRANSFERS:  Sit<>stand utilizing bilat UEs, decrease hip hinge forward   Sup to sit t/f coming up into long sit and then pivoting legs off EOB to keep weight off L hip   TODAY'S TREATMENT  03/09/22 Treadmill warm up 2.1 mph x 6 min Manual therapy:  STM  & TPR thoracolumbar paraspinals, low traps, L glute, L TSA, and QL  PROM hip ext, abd, IR/ER  Skilled asesssment and palpation for TPDN   Trigger Point Dry-Needling  Treatment instructions: Expect mild to moderate muscle soreness. S/S of pneumothorax if dry needled over a lung field, and to seek immediate medical attention should they occur. Patient verbalized understanding of these instructions and education.  Patient Consent Given: Yes Education handout provided: Previously provided Muscles treated: Low trap Electrical stimulation performed: No Parameters: N/A Treatment response/outcome: Twitch response, increased muscle length  Therex:  Prone   Hip ER 2x10 R&L red TB   Hip ext 2x10 R&L  Quadruped   Bird dog 2x10   Bird dog with knee tap 2x10      03/01/22 Treadmill warm up 1.6 mph x 6 min  Manual therapy:  STM & TPR thoracolumbar paraspinals, rhomboids, low traps, L glute, L TSA, and QL  PROM hip ext, abd, IR/ER  Therex:  Prone   "I" 2x10   "W" 2x10   "Y" 2x10   Hip ext 2x10 R&L eccentrics   Bent  knee fall outs x10  Supine   LTR x10   Clamshell green TB x10; blue TB x10  Standing   Lateral hip shift into wall x30 sec  Ionto 4 hour patch with dexa on L TSA   02/22/22 Treadmill warm up 1.9 mph x 5 min  Manual therapy:  STM & TPR L glute, TSA, and QL  PROM hip ext, abd, IR/ER Therex:  Supine     DKTC with lat stretch 2x30 sec (PT assist)   Clamshell green tband 2x10 L&R   Marching green tband 2x10 L&R   SLR 2x10 L&R  Prone:   Hip flexor stretch x30 sec PT assist   Hip extension x10 R&L    PATIENT EDUCATION:  Education details: Reviewed TPDN instructions (see above), new HEP Person educated: Patient Education method: Tourist information centre manager Education comprehension: verbalized understanding   HOME EXERCISE PROGRAM: Access Code [email or web address]  ASSESSMENT:  CLINICAL IMPRESSION: Pt reports she didn't feel a difference with the ionto. Session focused  on progressing hip and pelvic strengthening.    OBJECTIVE IMPAIRMENTS Abnormal gait, decreased activity tolerance, decreased mobility, decreased ROM, decreased strength, hypomobility, increased fascial restrictions, increased muscle spasms, impaired flexibility, postural dysfunction, and pain.   ACTIVITY LIMITATIONS squatting, transfers, bed mobility, toileting, dressing, and locomotion level  PARTICIPATION LIMITATIONS: interpersonal relationship and community activity  PERSONAL FACTORS Age, Past/current experiences, and Time since onset of injury/illness/exacerbation are also affecting patient's functional outcome.   REHAB POTENTIAL: Good  CLINICAL DECISION MAKING: Evolving/moderate complexity  EVALUATION COMPLEXITY: Moderate   GOALS: Goals reviewed with patient? Yes  SHORT TERM GOALS: Target date: 03/09/2022  Pt will remain ind with her HEP Baseline: Goal status: IN PROGRESS    LONG TERM GOALS: Target date: 04/05/2022  Pt will be ind with maintaining her HEP Baseline:  Goal status: IN PROGRESS  2.  Pt will demo at least 4+/5 hip flexor and hip extensor strength for improved transfers Baseline:  Goal status: IN PROGRESS  3.  Pt will report decreased pain by >/= 50% Baseline:  Goal status: IN PROGRESS  4.  Pt will have improved FOTO score to at least 58 Baseline:  Goal status: IN PROGRESS  5.  Pt will be able to tolerate L sidelying for improved bed mobility Baseline:  Goal status: IN PROGRESS    PLAN: PT FREQUENCY: 1x/week (per pt request)  PT DURATION: 8 weeks  PLANNED INTERVENTIONS: Therapeutic exercises, Therapeutic activity, Neuromuscular re-education, Balance training, Gait training, Patient/Family education, Self Care, Joint mobilization, Dry Needling, Electrical stimulation, Spinal mobilization, Cryotherapy, Moist heat, Taping, Traction, Ionotophoresis 4mg /ml Dexamethasone, Manual therapy, and Re-evaluation.  PLAN FOR NEXT SESSION: Assess response to  HEP. Continue hip flexor and extensor strengthening. Manual work for thoraco lumbar mobility.    Venera Privott April Ma L Tam Savoia, PT, DPT 03/09/2022, 9:33 AM

## 2022-03-14 ENCOUNTER — Other Ambulatory Visit: Payer: Self-pay | Admitting: Physician Assistant

## 2022-03-15 ENCOUNTER — Encounter: Payer: Self-pay | Admitting: Physical Therapy

## 2022-03-15 ENCOUNTER — Ambulatory Visit: Payer: Medicare Other | Admitting: Physical Therapy

## 2022-03-15 DIAGNOSIS — M25552 Pain in left hip: Secondary | ICD-10-CM

## 2022-03-15 DIAGNOSIS — M5459 Other low back pain: Secondary | ICD-10-CM | POA: Diagnosis not present

## 2022-03-15 DIAGNOSIS — R2689 Other abnormalities of gait and mobility: Secondary | ICD-10-CM | POA: Diagnosis not present

## 2022-03-15 DIAGNOSIS — M6281 Muscle weakness (generalized): Secondary | ICD-10-CM | POA: Diagnosis not present

## 2022-03-15 DIAGNOSIS — M25652 Stiffness of left hip, not elsewhere classified: Secondary | ICD-10-CM

## 2022-03-15 DIAGNOSIS — R252 Cramp and spasm: Secondary | ICD-10-CM
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
## 2022-03-22 ENCOUNTER — Encounter: Payer: Self-pay | Admitting: Physical Therapy

## 2022-03-22 ENCOUNTER — Ambulatory Visit: Payer: Medicare Other | Admitting: Physical Therapy

## 2022-03-22 DIAGNOSIS — M5459 Other low back pain: Secondary | ICD-10-CM

## 2022-03-22 DIAGNOSIS — R2689 Other abnormalities of gait and mobility: Secondary | ICD-10-CM

## 2022-03-22 DIAGNOSIS — R252 Cramp and spasm: Secondary | ICD-10-CM | POA: Diagnosis not present

## 2022-03-22 DIAGNOSIS — M25552 Pain in left hip: Secondary | ICD-10-CM

## 2022-03-22 DIAGNOSIS — M6281 Muscle weakness (generalized): Secondary | ICD-10-CM

## 2022-03-22 DIAGNOSIS — M25652 Stiffness of left hip, not elsewhere classified: Secondary | ICD-10-CM | POA: Diagnosis not present
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
## 2022-03-23 DIAGNOSIS — K5989 Other specified functional intestinal disorders: Secondary | ICD-10-CM | POA: Diagnosis not present

## 2022-03-23 DIAGNOSIS — K5900 Constipation, unspecified: Secondary | ICD-10-CM | POA: Diagnosis not present

## 2022-03-23 DIAGNOSIS — Z9089 Acquired absence of other organs: Secondary | ICD-10-CM | POA: Diagnosis not present

## 2022-03-23 DIAGNOSIS — N3289 Other specified disorders of bladder: Secondary | ICD-10-CM | POA: Diagnosis not present

## 2022-03-27 ENCOUNTER — Ambulatory Visit (INDEPENDENT_AMBULATORY_CARE_PROVIDER_SITE_OTHER): Payer: Medicare Other | Admitting: Physician Assistant

## 2022-03-27 DIAGNOSIS — Z0000 Encounter for general adult medical examination without abnormal findings: Secondary | ICD-10-CM | POA: Diagnosis not present

## 2022-03-27 NOTE — Progress Notes (Signed)
MEDICARE ANNUAL WELLNESS VISIT  03/27/2022  Telephone Visit Disclaimer This Medicare AWV was conducted by telephone due to national recommendations for restrictions regarding the COVID-19 Pandemic (e.g. social distancing).  I verified, using two identifiers, that I am speaking with Judy Russell or their authorized healthcare agent. I discussed the limitations, risks, security, and privacy concerns of performing an evaluation and management service by telephone and the potential availability of an in-person appointment in the future. The patient expressed understanding and agreed to proceed.  Location of Patient: Home Location of Provider (nurse):  In the office.  Subjective:    Judy Russell is a 66 y.o. female patient of Alden Hipp, Royetta Car, PA-C who had a [employer] Visit today via telephone. Judy Russell is Disabled and lives with their spouse. she has 2 children. she reports that she is socially active and does interact with friends/family regularly. she is minimally physically active and enjoys cooking.  Patient Care Team: Lavada Mesi as PCP - General (Family Medicine)     03/27/2022    9:00 AM 08/08/2021   11:03 AM 12/01/2020    8:04 AM 12/01/2020    8:03 AM  Advanced Directives  Does Patient Have a Medical Advance Directive? No No No No  Would patient like information on creating a medical advance directive? No - Patient declined Yes (MAU/Ambulatory/Procedural Areas - Information given) No - Patient declined No - Patient declined    Hospital Utilization Over the Past 12 Months: # of hospitalizations or ER visits: 2 # of surgeries: 0  Review of Systems    Patient reports that her overall health is worse compared to last year.  History obtained from chart review and the patient  Patient Reported Readings (BP, Pulse, CBG, Weight, etc) none  Pain Assessment Pain : No/denies pain     Current Medications & Allergies (verified) Allergies as of 03/27/2022        Reactions   Decadron [dexamethasone]    Jittery/anxiety/shaking   Wellbutrin [bupropion]    seizures   Atrovent [ipratropium]    Dizzy and weak after using.   Carafate [sucralfate]    Lip irritation   Doxycycline    Vomiting, diarrhea, brain fog, headache   Corn-containing Products Itching   Valtrex [valacyclovir Hcl]    Headache, nausea   Wheat         Medication List        Accurate as of March 27, 2022  9:15 AM. If you have any questions, ask your nurse or doctor.          diclofenac Sodium 1 % Gel Commonly known as: VOLTAREN Apply 4 g topically 4 (four) times daily. To affected joint.   hydroxychloroquine 200 MG tablet Commonly known as: PLAQUENIL   lamoTRIgine 100 MG tablet Commonly known as: LAMICTAL TAKE 1 TABLET BY MOUTH DAILY  TAKING WITH 150 MG TABLET   lamoTRIgine 150 MG tablet Commonly known as: LAMICTAL TAKE 1 TABLET BY MOUTH DAILY  TAKING WITH THE 100 MG TABLET   levothyroxine 125 MCG tablet Commonly known as: SYNTHROID TAKE 1 TABLET BY MOUTH DAILY  MONDAY-SATURDAY. TAKE 2 TABLETS  BY MOUTH ON SUNDAYS.   pantoprazole 40 MG tablet Commonly known as: PROTONIX Take 1 tablet (40 mg total) by mouth daily.   polyethylene glycol 17 g packet Commonly known as: MIRALAX / GLYCOLAX Take 17 g by mouth daily.   traMADol 50 MG tablet Commonly known as: ULTRAM Take 1-2 tablets up to twice a  day(every 12 hours).        History (reviewed): Past Medical History:  Diagnosis Date   Allergy    Fibromyalgia    HSV-2 (herpes simplex virus 2) infection    Seizures ([place])    Thyroid disease    Past Surgical History:  Procedure Laterality Date   APPENDECTOMY     DILATION AND CURETTAGE OF UTERUS     OOPHORECTOMY Left    Family History  Problem Relation Age of Onset   Cancer Mother        breast   Diabetes Mother    Alcohol abuse Father    Hypertension Father    Stroke Father    Social History   Socioeconomic History   Marital  status: Married    Spouse name: Cornelia Copa   Number of children: 2   Years of education: 12   Highest education level: 12th grade  Occupational History    Comment: Disabled  Tobacco Use   Smoking status: Never   Smokeless tobacco: Never  Substance and Sexual Activity   Alcohol use: No   Drug use: No   Sexual activity: Not Currently  Other Topics Concern   Not on file  Social History Narrative   Lives with her spouse. Likes to cook in her free time.    Social Determinants of Health   Financial Resource Strain: Low Risk  (03/27/2022)   Overall Financial Resource Strain (CARDIA)    Difficulty of Paying Living Expenses: Not hard at all  Food Insecurity: No Food Insecurity (03/27/2022)   Hunger Vital Sign    Worried About Running Out of Food in the Last Year: Never true    Ran Out of Food in the Last Year: Never true  Transportation Needs: No Transportation Needs (03/27/2022)   PRAPARE - Hydrologist (Medical): No    Lack of Transportation (Non-Medical): No  Physical Activity: Insufficiently Active (03/27/2022)   Exercise Vital Sign    Days of Exercise per Week: 7 days    Minutes of Exercise per Session: 20 min  Stress: No Stress Concern Present (03/27/2022)   [place]    Feeling of Stress : Not at all  Social Connections: Moderately Integrated (03/27/2022)   Social Connection and Isolation Panel [NHANES]    Frequency of Communication with Friends and Family: More than three times a week    Frequency of Social Gatherings with Friends and Family: More than three times a week    Attends Religious Services: More than 4 times per year    Active Member of [employer] or Organizations: No    Attends Archivist Meetings: Never    Marital Status: Married    Activities of Daily Living    03/27/2022    9:05 AM  In your present state of health, do you have any difficulty performing the  following activities:  Hearing? 0  Comment has bilateral hearing aids.  Vision? 0  Comment wears glasses  Difficulty concentrating or making decisions? 1  Comment sometimes has some short-term memory loss  Walking or climbing stairs? 0  Dressing or bathing? 0  Doing errands, shopping? 0  Preparing Food and eating ? N  Using the Toilet? N  In the past six months, have you accidently leaked urine? N  Do you have problems with loss of bowel control? N  Managing your Medications? N  Managing your Finances? N  Housekeeping or managing your Housekeeping?  N    Patient Education/ Literacy How often do you need to have someone help you when you read instructions, pamphlets, or other written materials from your doctor or pharmacy?: 1 - Never What is the last grade level you completed in school?: 12th grade  Exercise Current Exercise Habits: Home exercise routine (PT), Type of exercise: Other - see comments (physical therapy), Time (Minutes): 20, Frequency (Times/Week): 7, Weekly Exercise (Minutes/Week): 140, Intensity: Moderate, Exercise limited by: orthopedic condition(s)  Diet Patient reports consuming  2-3  meals a day and 0 snack(s) a day Patient reports that her primary diet is: Regular Patient reports that she does have regular access to food.   Depression Screen    03/27/2022    9:01 AM 03/18/2021    4:33 PM 02/28/2021    9:06 AM 12/17/2020   10:05 AM 12/01/2020    8:10 AM 12/24/2019    8:46 AM 05/13/2019    2:53 PM  PHQ 2/9 Scores  PHQ - 2 Score 0 0 2 1 0 0 0  PHQ- 9 Score  0 6 3  11  0     Fall Risk    03/27/2022    9:01 AM 07/25/2021    3:06 PM 12/01/2020    8:08 AM 12/24/2019    8:46 AM 05/13/2019    2:51 PM  Fall Risk   Falls in the past year? 1 1 0 0 0  Number falls in past yr: 0 1 0 0 0  Injury with Fall? 1 1 0 0 0  Risk for fall due to : History of fall(s);Impaired mobility  No Fall Risks    Follow up Falls evaluation completed;Education provided;Falls prevention  discussed  Falls evaluation completed Falls evaluation completed Falls evaluation completed     Objective:  Judy Russell seemed alert and oriented and she participated appropriately during our telephone visit.  Blood Pressure Weight BMI  BP Readings from Last 3 Encounters:  11/01/21 (!) 142/67  10/06/21 122/77  07/25/21 (!) 147/74   Wt Readings from Last 3 Encounters:  11/01/21 116 lb (52.6 kg)  10/06/21 119 lb (54 kg)  07/25/21 119 lb (54 kg)   BMI Readings from Last 1 Encounters:  11/01/21 22.65 kg/m    *Unable to obtain current vital signs, weight, and BMI due to telephone visit type  Hearing/Vision  Judy Russell did not seem to have difficulty with hearing/understanding during the telephone conversation Reports that she has had a formal eye exam by an eye care professional within the past year Reports that she has had a formal hearing evaluation within the past year *Unable to fully assess hearing and vision during telephone visit type  Cognitive Function:    03/27/2022    9:08 AM 12/01/2020    8:16 AM 07/19/2017   10:49 PM  6CIT Screen  What Year? 0 points 0 points 0 points  What month? 0 points 0 points 0 points  What time? 3 points 0 points 0 points  Count back from 20 0 points 0 points 0 points  Months in reverse 0 points 2 points 0 points  Repeat phrase 0 points 2 points 2 points  Total Score 3 points 4 points 2 points   (Normal:0-7, Significant for Dysfunction: >8)  Normal Cognitive Function Screening: Yes   Immunization & Health Maintenance Record  There is no immunization history on file for this patient.  Health Maintenance  Topic Date Due   Zoster Vaccines- Shingrix (1 of 2) 06/26/2022 (Originally 02/14/1975)  MAMMOGRAM  03/28/2023 (Originally 02/15/2008)   TETANUS/TDAP  05/04/2025 (Originally 02/14/1975)   Fecal DNA (Cologuard)  06/06/2024   Hepatitis C Screening  Completed   HIV Screening  Completed   HPV VACCINES  Aged Out   INFLUENZA VACCINE   Discontinued   PAP SMEAR-Modifier  Discontinued       Assessment  This is a routine wellness examination for [employer].  Health Maintenance: Due or Overdue There are no preventive care reminders to display for this patient.   Judy Russell does not need a referral for Community Assistance: Care Management:   no Social Work:    no Prescription Assistance:  no Nutrition/Diabetes Education:  no   Plan:  Personalized Goals  Goals Addressed               This Visit's Progress     Patient Stated (pt-stated)        States that she would like to be able to have more mobility with the herniated disc.        Personalized Health Maintenance & Screening Recommendations  Td vaccine Screening mammography Shingrix vaccine  Patient declined the Mammogram screening and the immunizations   Lung Cancer Screening Recommended: no (Low Dose CT Chest recommended if Age 82-80 years, 30 pack-year currently smoking OR have quit w/in past 15 years) Hepatitis C Screening recommended: no HIV Screening recommended: no  Advanced Directives: Written information was not prepared per patient's request.  Referrals & Orders No orders of the defined types were placed in this encounter.   Follow-up Plan Follow-up with Donella Stade, PA-C as planned Medicare wellness visit in one year.  Patient will access AVS on my chart.   I have personally reviewed and noted the following in the patient's chart:   Medical and social history Use of alcohol, tobacco or illicit drugs  Current medications and supplements Functional ability and status Nutritional status Physical activity Advanced directives List of other physicians Hospitalizations, surgeries, and ER visits in previous 12 months Vitals Screenings to include cognitive, depression, and falls Referrals and appointments  In addition, I have reviewed and discussed with Judy Russell certain preventive protocols, quality metrics, and  best practice recommendations. A written personalized care plan for preventive services as well as general preventive health recommendations is available and can be mailed to the patient at her request.      Tinnie Gens, RN BSN  03/27/2022

## 2022-03-27 NOTE — Patient Instructions (Addendum)
MEDICARE ANNUAL WELLNESS VISIT Health Maintenance Summary and Written Plan of Care  Judy Russell ,  Thank you for allowing me to perform your Medicare Annual Wellness Visit and for your ongoing commitment to your health.   Health Maintenance & Immunization History Health Maintenance  Topic Date Due  . Zoster Vaccines- Shingrix (1 of 2) 06/26/2022 (Originally 02/14/1975)  . MAMMOGRAM  03/28/2023 (Originally 02/15/2008)  . TETANUS/TDAP  05/04/2025 (Originally 02/14/1975)  . Fecal DNA (Cologuard)  06/06/2024  . Hepatitis C Screening  Completed  . HIV Screening  Completed  . HPV VACCINES  Aged Out  . INFLUENZA VACCINE  Discontinued  . PAP SMEAR-Modifier  Discontinued    There is no immunization history on file for this patient.  These are the patient goals that we discussed:  Goals Addressed              This Visit's Progress   .  Patient Stated (pt-stated)        States that she would like to be able to have more mobility with the herniated disc.         This is a list of Health Maintenance Items that are overdue or due now: Td vaccine Screening mammography Shingrix vaccine    Orders/Referrals Placed Today: No orders of the defined types were placed in this encounter.  (Contact our referral department at [phone] if you have not spoken with someone about your referral appointment within the next 5 days)    Follow-up Plan Follow-up with Jomarie Longs, PA-C as planned Medicare wellness visit in one year.  Patient will access AVS on my chart.      Health Maintenance, Female Adopting a healthy lifestyle and getting preventive care are important in promoting health and wellness. Ask your health care provider about: The right schedule for you to have regular tests and exams. Things you can do on your own to prevent diseases and keep yourself healthy. What should I know about diet, weight, and exercise? Eat a healthy diet  Eat a diet that includes plenty of  vegetables, fruits, low-fat dairy products, and lean protein. Do not eat a lot of foods that are high in solid fats, added sugars, or sodium. Maintain a healthy weight Body mass index (BMI) is used to identify weight problems. It estimates body fat based on height and weight. Your health care provider can help determine your BMI and help you achieve or maintain a healthy weight. Get regular exercise Get regular exercise. This is one of the most important things you can do for your health. Most adults should: Exercise for at least 150 minutes each week. The exercise should increase your heart rate and make you sweat (moderate-intensity exercise). Do strengthening exercises at least twice a week. This is in addition to the moderate-intensity exercise. Spend less time sitting. Even light physical activity can be beneficial. Watch cholesterol and blood lipids Have your blood tested for lipids and cholesterol at 64 years of age, then have this test every 5 years. Have your cholesterol levels checked more often if: Your lipid or cholesterol levels are high. You are older than 64 years of age. You are at high risk for heart disease. What should I know about cancer screening? Depending on your health history and family history, you may need to have cancer screening at various ages. This may include screening for: Breast cancer. Cervical cancer. Colorectal cancer. Skin cancer. Lung cancer. What should I know about heart disease, diabetes, and high blood  pressure? Blood pressure and heart disease High blood pressure causes heart disease and increases the risk of stroke. This is more likely to develop in people who have high blood pressure readings or are overweight. Have your blood pressure checked: Every 3-5 years if you are 48-26 years of age. Every year if you are 58 years old or older. Diabetes Have regular diabetes screenings. This checks your fasting blood sugar level. Have the screening  done: Once every three years after age 71 if you are at a normal weight and have a low risk for diabetes. More often and at a younger age if you are overweight or have a high risk for diabetes. What should I know about preventing infection? Hepatitis B If you have a higher risk for hepatitis B, you should be screened for this virus. Talk with your health care provider to find out if you are at risk for hepatitis B infection. Hepatitis C Testing is recommended for: Everyone born from [birth year] through 1963. Anyone with known risk factors for hepatitis C. Sexually transmitted infections (STIs) Get screened for STIs, including gonorrhea and chlamydia, if: You are sexually active and are younger than 64 years of age. You are older than 64 years of age and your health care provider tells you that you are at risk for this type of infection. Your sexual activity has changed since you were last screened, and you are at increased risk for chlamydia or gonorrhea. Ask your health care provider if you are at risk. Ask your health care provider about whether you are at high risk for HIV. Your health care provider may recommend a prescription medicine to help prevent HIV infection. If you choose to take medicine to prevent HIV, you should first get tested for HIV. You should then be tested every 3 months for as long as you are taking the medicine. Pregnancy If you are about to stop having your period (premenopausal) and you may become pregnant, seek counseling before you get pregnant. Take 400 to 800 micrograms (mcg) of folic acid every day if you become pregnant. Ask for birth control (contraception) if you want to prevent pregnancy. Osteoporosis and menopause Osteoporosis is a disease in which the bones lose minerals and strength with aging. This can result in bone fractures. If you are 34 years old or older, or if you are at risk for osteoporosis and fractures, ask your health care provider if you should: Be  screened for bone loss. Take a calcium or vitamin D supplement to lower your risk of fractures. Be given hormone replacement therapy (HRT) to treat symptoms of menopause. Follow these instructions at home: Alcohol use Do not drink alcohol if: Your health care provider tells you not to drink. You are pregnant, may be pregnant, or are planning to become pregnant. If you drink alcohol: Limit how much you have to: 0-1 drink a day. Know how much alcohol is in your drink. In the [place] one drink equals one 12 oz bottle of beer (355 mL), one 5 oz glass of wine (148 mL), or one 1 oz glass of hard liquor (44 mL). Lifestyle Do not use any products that contain nicotine or tobacco. These products include cigarettes, chewing tobacco, and vaping devices, such as e-cigarettes. If you need help quitting, ask your health care provider. Do not use street drugs. Do not share needles. Ask your health care provider for help if you need support or information about quitting drugs. General instructions Schedule regular health, dental, and eye  exams. Stay current with your vaccines. Tell your health care provider if: You often feel depressed. You have ever been abused or do not feel safe at home. Summary Adopting a healthy lifestyle and getting preventive care are important in promoting health and wellness. Follow your health care provider's instructions about healthy diet, exercising, and getting tested or screened for diseases. Follow your health care provider's instructions on monitoring your cholesterol and blood pressure. This information is not intended to replace advice given to you by your health care provider. Make sure you discuss any questions you have with your health care provider. Document Revised: 11/08/2020 Document Reviewed: 11/08/2020 Elsevier Patient Education  2023 [employer].

## 2022-03-29 ENCOUNTER — Ambulatory Visit: Payer: Medicare Other | Admitting: Physical Therapy

## 2022-03-29 ENCOUNTER — Encounter: Payer: Self-pay | Admitting: Physical Therapy

## 2022-03-29 DIAGNOSIS — R2689 Other abnormalities of gait and mobility: Secondary | ICD-10-CM

## 2022-03-29 DIAGNOSIS — R252 Cramp and spasm: Secondary | ICD-10-CM | POA: Diagnosis not present

## 2022-03-29 DIAGNOSIS — M25652 Stiffness of left hip, not elsewhere classified: Secondary | ICD-10-CM

## 2022-03-29 DIAGNOSIS — M6281 Muscle weakness (generalized): Secondary | ICD-10-CM

## 2022-03-29 DIAGNOSIS — M5459 Other low back pain: Secondary | ICD-10-CM | POA: Diagnosis not present

## 2022-03-29 DIAGNOSIS — M25552 Pain in left hip: Secondary | ICD-10-CM | POA: Diagnosis not present

## 2022-03-29 NOTE — Therapy (Signed)
OUTPATIENT PHYSICAL THERAPY TREATMENT   Patient Name: Judy Russell MRN: [id number] DOB:16-Mar-1956, 66 y.o., female Today's Date: 03/29/2022   PT End of Session - 03/29/22 1406     Visit Number 8    Number of Visits 8    Date for PT Re-Evaluation 04/05/22    Authorization Type [employer] Medicare    PT Start Time 1405    PT Stop Time 1445    PT Time Calculation (min) 40 min    Activity Tolerance Patient tolerated treatment well    Behavior During Therapy WFL for tasks assessed/performed             Past Medical History:  Diagnosis Date   Allergy    Fibromyalgia    HSV-2 (herpes simplex virus 2) infection    Seizures (HCC)    Thyroid disease    Past Surgical History:  Procedure Laterality Date   APPENDECTOMY     DILATION AND CURETTAGE OF UTERUS     OOPHORECTOMY Left    Patient Active Problem List   Diagnosis Date Noted   Chronic pain syndrome 01/10/2022   Abnormality of globulin 01/10/2022   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 07/27/2021   Fall 07/26/2021   Chronic bilateral low back pain without sciatica 07/25/2021   Syncope 05/31/2021   Acute gastritis without hemorrhage 05/31/2021   Hamstring strain, left, initial encounter 04/28/2021   Bipolar 1 disorder, mixed (HCC) 03/21/2021   No energy 02/28/2021   Lack of motivation 02/28/2021   Rectocele 12/06/2020   Toenail fungus 09/14/2020   Corn of toe 09/14/2020   Tapeworm 04/02/2020   Unintended weight loss 02/18/2020   Loss of taste 02/18/2020   Scalp itch 01/12/2020   Allergic reaction 01/09/2020   Mold exposure 12/24/2019   Chest tightness 12/24/2019   History of COVID-19 10/07/2019   COVID-19 virus infection 09/22/2019   Suspected COVID-19 virus infection 09/11/2019   Seborrheic keratoses 05/14/2019   Food allergy 01/29/2019   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   Chronic constipation 10/30/2018   Cervical radiculopathy 08/21/2018   Tonsillith 07/30/2018   Callus of foot 07/29/2018   Multiple food allergies  03/25/2018   Environmental allergies 03/25/2018   CFS (chronic fatigue syndrome) 11/27/2017   Perennial allergic rhinitis 08/13/2017   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   Memory changes 07/19/2017   Hyperlipidemia 07/11/2017   Elevated blood pressure reading 07/11/2017   History of colon resection 07/09/2017   History of irregular heartbeat 07/09/2017   Sinus bradycardia by electrocardiogram 07/09/2017   Left leg pain 03/18/2017   Right foot pain 03/18/2017   Menopausal symptoms 07/21/2016   Mood disorder in conditions classified elsewhere 07/21/2016   Plantar wart of left foot 02/29/2016   Dyspareunia in female 07/07/2015   Herpes genitalis in women 07/07/2015   Fibromyalgia 05/05/2015   Primary osteoarthritis involving multiple joints 05/05/2015   Thyroid activity decreased 05/05/2015   Chronic neck pain 05/05/2015   Hypothyroidism 09/04/2009   SEIZURE DISORDER 09/04/2009    PCP: Tandy Gaw  REFERRING PROVIDER: Tandy Gaw  REFERRING DIAG: Back pain  Rationale for Evaluation and Treatment Rehabilitation  THERAPY DIAG:  Stiffness of left hip, not elsewhere classified  Pain in left hip  Other low back pain  Cramp and spasm  Muscle weakness (generalized)  Other abnormalities of gait and mobility  ONSET DATE: Since beginning of 2023  SUBJECTIVE:  SUBJECTIVE STATEMENT: Pt reports she's been impacted this past week -- has not been able to do her exercises. Had to go to the ER. Finally had a bowel movement on Monday.   PERTINENT HISTORY:  Arthritis  PAIN:  Are you having pain? Yes: NPRS scale: 7-8/10 Pain location: L hip/low back Pain description: Achy, tight Aggravating factors: after sleeping in the morning Relieving factors: Increased time   PRECAUTIONS:  None  WEIGHT BEARING RESTRICTIONS No  FALLS:  Has patient fallen in last 6 months? No  LIVING ENVIRONMENT: Lives with: lives with their spouse Lives in: House/apartment   OCCUPATION: Retired  PLOF: Independent  PATIENT GOALS Improve dressing ADLs with less pain   OBJECTIVE:   DIAGNOSTIC FINDINGS:  MRI 11/15/21: IMPRESSION: Overall mild multilevel degenerative changes as detailed above without high-grade stenosis. There is marrow edema eccentric to the left at L2-L3 that may be a source reported symptoms.    PATIENT SURVEYS:  FOTO 49; predicted 90   SENSATION: WFL  MUSCLE LENGTH: Hamstrings: Right 80 deg; Left 80 deg Thomas test: To be assessed  POSTURE: forward head and increased thoracic kyphosis  PALPATION: TTP L>R glute med, upper glute max, L lumbar paraspinal Lumbar hypomobility L1-L5 Sacral hypomobility into anterior rotation L>R; posterior rotation [facility]    LOWER EXTREMITY MMT:    MMT Right eval Left eval Right 9/27 Left 9/27  Hip flexion 4+/5 3+/5 4+ 4  Hip extension 3/5 3-/5 4 4   Hip abduction 4/5* 3+/5 4- 4-  Hip adduction      Hip internal rotation 4+/5 4+/5    Hip external rotation 4+/5 4+/5    Knee flexion 5/5 5/5    Knee extension 5/5 5/5    Ankle dorsiflexion      Ankle plantarflexion      Ankle inversion      Ankle eversion       (Blank rows = not tested)  *Difficulty tolerating L sidelying  LUMBAR SPECIAL TESTS:  Straight leg raise test: Negative and SI Compression/distraction test: Negative Unable to actively perform SLR  TRANSFERS:  Sit<>stand utilizing bilat UEs, decrease hip hinge forward   Sup to sit t/f coming up into long sit and then pivoting legs off EOB to keep weight off L hip   TODAY'S TREATMENT  03/29/22 Treadmill warm up 1.9 mph x 6 min Manual therapy:  STM & TPR L glute, bilat TSA, and QL Prone    Hip ext 2x10 R&L    Hip IR 2x10 red TB    Hip ER 2x10 red TB Sidelying   Hip abd 2x10 Sitting  Hip  flexion red TB 2x10 Quadruped  Oblique contraction 2x10  Bird dog 2x10 Supine  Diaphragmatic inhalation with straw 3x 2 min   03/22/22 Treadmill warm up 2.2 mph x 5 min Manual therapy:  STM & TPR L glute, bilat TSA, and QL  PROM hip ext, abd, IR/ER  Prone    Hip ext 2x10 R&L    Hip IR 2x10 yellow TB     Quadruped    Bird dog x10    Oblique contraction x10   03/15/22 Treadmill warm up 1.8 mph x 5 min Manual therapy:  STM & TPR thoracolumbar paraspinals, low traps, L glute, R TSA, and QL  PROM hip ext, abd, IR/ER Trigger Point Dry-Needling  Treatment instructions: Expect mild to moderate muscle soreness. S/S of pneumothorax if dry needled over a lung field, and to seek immediate medical attention should they occur. Patient verbalized understanding  of these instructions and education.  Patient Consent Given: Yes Education handout provided: Previously provided Muscles treated: Mid trap, Low trap Electrical stimulation performed: No Parameters: N/A Treatment response/outcome: Twitch response, increased muscle length  Therex:  Prone  Hip ext 2x10 R&L  Child's pose x20 sec, with thread the needle x30 sec   Sitting  Hip ER red TB 2x10   03/09/22 Treadmill warm up 2.1 mph x 6 min Manual therapy:  STM & TPR thoracolumbar paraspinals, low traps, L glute, L TSA, and QL  PROM hip ext, abd, IR/ER  Skilled asesssment and palpation for TPDN   Trigger Point Dry-Needling  Treatment instructions: Expect mild to moderate muscle soreness. S/S of pneumothorax if dry needled over a lung field, and to seek immediate medical attention should they occur. Patient verbalized understanding of these instructions and education.  Patient Consent Given: Yes Education handout provided: Previously provided Muscles treated: Low trap Electrical stimulation performed: No Parameters: N/A Treatment response/outcome: Twitch response, increased muscle length  Therex:  Prone   Hip ER 2x10 R&L red  TB   Hip ext 2x10 R&L  Quadruped   Bird dog 2x10   Bird dog with knee tap 2x10      PATIENT EDUCATION:  Education details: Reviewed TPDN instructions (see above), new HEP Person educated: Patient Education method: Tourist information centre manager Education comprehension: verbalized understanding   HOME EXERCISE PROGRAM: Access Code [email or web address]  ASSESSMENT:  CLINICAL IMPRESSION: Pt is demonstrating improving hip strength. Continued decreased tolerance to L sidelying (still gets pain with pressure on greater trochanter). Initiated diaphragmatic breathing.    OBJECTIVE IMPAIRMENTS Abnormal gait, decreased activity tolerance, decreased mobility, decreased ROM, decreased strength, hypomobility, increased fascial restrictions, increased muscle spasms, impaired flexibility, postural dysfunction, and pain.   ACTIVITY LIMITATIONS squatting, transfers, bed mobility, toileting, dressing, and locomotion level  PARTICIPATION LIMITATIONS: interpersonal relationship and community activity  PERSONAL FACTORS Age, Past/current experiences, and Time since onset of injury/illness/exacerbation are also affecting patient's functional outcome.   REHAB POTENTIAL: Good  CLINICAL DECISION MAKING: Evolving/moderate complexity  EVALUATION COMPLEXITY: Moderate   GOALS: Goals reviewed with patient? Yes  SHORT TERM GOALS: Target date: 03/09/2022  Pt will remain ind with her HEP Baseline: Goal status: IN PROGRESS    LONG TERM GOALS: Target date: 04/05/2022  Pt will be ind with maintaining her HEP Baseline:  Goal status: IN PROGRESS  2.  Pt will demo at least 4+/5 hip flexor and hip extensor strength for improved transfers Baseline:  Goal status: IN PROGRESS  3.  Pt will report decreased pain by >/= 50% Baseline:  Goal status: IN PROGRESS  4.  Pt will have improved FOTO score to at least 58 Baseline:  Goal status: IN PROGRESS  5.  Pt will be able to tolerate L sidelying for improved bed  mobility Baseline:  Goal status: IN PROGRESS    PLAN: PT FREQUENCY: 1x/week (per pt request)  PT DURATION: 8 weeks  PLANNED INTERVENTIONS: Therapeutic exercises, Therapeutic activity, Neuromuscular re-education, Balance training, Gait training, Patient/Family education, Self Care, Joint mobilization, Dry Needling, Electrical stimulation, Spinal mobilization, Cryotherapy, Moist heat, Taping, Traction, Ionotophoresis 4mg /ml Dexamethasone, Manual therapy, and Re-evaluation.  PLAN FOR NEXT SESSION: Assess response to HEP. Continue hip flexor and extensor strengthening. Manual work for thoraco lumbar mobility.    Yukio Bisping April Ma L Jessenia Filippone, PT, DPT 03/29/2022, 2:07 PM

## 2022-03-31 ENCOUNTER — Encounter: Payer: Self-pay | Admitting: Physical Therapy

## 2022-03-31 ENCOUNTER — Encounter: Payer: Self-pay | Admitting: Physician Assistant

## 2022-04-05 ENCOUNTER — Encounter: Payer: Self-pay | Admitting: Physical Therapy

## 2022-04-05 ENCOUNTER — Ambulatory Visit: Payer: Medicare Other | Attending: Sports Medicine | Admitting: Physical Therapy

## 2022-04-05 DIAGNOSIS — M25652 Stiffness of left hip, not elsewhere classified: Secondary | ICD-10-CM | POA: Insufficient documentation

## 2022-04-05 DIAGNOSIS — R252 Cramp and spasm: Secondary | ICD-10-CM | POA: Insufficient documentation

## 2022-04-05 DIAGNOSIS — M25552 Pain in left hip: Secondary | ICD-10-CM | POA: Insufficient documentation

## 2022-04-05 DIAGNOSIS — M5459 Other low back pain: Secondary | ICD-10-CM | POA: Insufficient documentation

## 2022-04-05 NOTE — Therapy (Addendum)
OUTPATIENT PHYSICAL THERAPY TREATMENT AND DISCHARGE   Patient Name: Maripaz Glickstein MRN: [id number] DOB:08-Apr-1956, 66 y.o., female Today's Date: 04/05/2022  PHYSICAL THERAPY DISCHARGE SUMMARY  Visits from Start of Care: 9  Current functional level related to goals / functional outcomes: See below   Remaining deficits: Pain remains   Education / Equipment: See below   Patient agrees to discharge. Patient goals were not met. Patient is being discharged due to not returning since the last visit.    PT End of Session - 04/05/22 1410     Visit Number 1    Number of Visits 16    Date for PT Re-Evaluation 05/31/22    Authorization Type [employer] Medicare    PT Start Time 1405    PT Stop Time 1445    PT Time Calculation (min) 40 min    Activity Tolerance Patient tolerated treatment well    Behavior During Therapy WFL for tasks assessed/performed             Past Medical History:  Diagnosis Date   Allergy    Fibromyalgia    HSV-2 (herpes simplex virus 2) infection    Seizures (HCC)    Thyroid disease    Past Surgical History:  Procedure Laterality Date   APPENDECTOMY     DILATION AND CURETTAGE OF UTERUS     OOPHORECTOMY Left    Patient Active Problem List   Diagnosis Date Noted   Chronic pain syndrome 01/10/2022   Abnormality of globulin 01/10/2022   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 07/27/2021   Fall 07/26/2021   Chronic bilateral low back pain without sciatica 07/25/2021   Syncope 05/31/2021   Acute gastritis without hemorrhage 05/31/2021   Hamstring strain, left, initial encounter 04/28/2021   Bipolar 1 disorder, mixed (HCC) 03/21/2021   No energy 02/28/2021   Lack of motivation 02/28/2021   Rectocele 12/06/2020   Toenail fungus 09/14/2020   Corn of toe 09/14/2020   Tapeworm 04/02/2020   Unintended weight loss 02/18/2020   Loss of taste 02/18/2020   Scalp itch 01/12/2020   Allergic reaction 01/09/2020   Mold exposure 12/24/2019   Chest tightness  12/24/2019   History of COVID-19 10/07/2019   COVID-19 virus infection 09/22/2019   Suspected COVID-19 virus infection 09/11/2019   Seborrheic keratoses 05/14/2019   Food allergy 01/29/2019   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   Chronic constipation 10/30/2018   Cervical radiculopathy 08/21/2018   Tonsillith 07/30/2018   Callus of foot 07/29/2018   Multiple food allergies 03/25/2018   Environmental allergies 03/25/2018   CFS (chronic fatigue syndrome) 11/27/2017   Perennial allergic rhinitis 08/13/2017   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   Memory changes 07/19/2017   Hyperlipidemia 07/11/2017   Elevated blood pressure reading 07/11/2017   History of colon resection 07/09/2017   History of irregular heartbeat 07/09/2017   Sinus bradycardia by electrocardiogram 07/09/2017   Left leg pain 03/18/2017   Right foot pain 03/18/2017   Menopausal symptoms 07/21/2016   Mood disorder in conditions classified elsewhere 07/21/2016   Plantar wart of left foot 02/29/2016   Dyspareunia in female 07/07/2015   Herpes genitalis in women 07/07/2015   Fibromyalgia 05/05/2015   Primary osteoarthritis involving multiple joints 05/05/2015   Thyroid activity decreased 05/05/2015   Chronic neck pain 05/05/2015   Hypothyroidism 09/04/2009   SEIZURE DISORDER 09/04/2009    PCP: Tandy Gaw  REFERRING PROVIDER: Tandy Gaw  REFERRING DIAG: Back pain  Rationale for Evaluation and Treatment Rehabilitation  THERAPY DIAG:  Stiffness of  left hip, not elsewhere classified  Pain in left hip  Other low back pain  Cramp and spasm  ONSET DATE: Since beginning of 2023  SUBJECTIVE:                                                                                                                                                                                           SUBJECTIVE STATEMENT: Pt states she's tried to sleep in the bed for 3 weeks. Has woken up with a lot of stiffness in her L pelvis/hip.     PERTINENT HISTORY:  Arthritis  PAIN:  Are you having pain? Yes: NPRS scale: 7-8/10 Pain location: L hip/low back Pain description: Achy, tight Aggravating factors: after sleeping in the morning Relieving factors: Increased time   PRECAUTIONS: None  WEIGHT BEARING RESTRICTIONS No  FALLS:  Has patient fallen in last 6 months? No  LIVING ENVIRONMENT: Lives with: lives with their spouse Lives in: House/apartment   OCCUPATION: Retired  PLOF: Independent  PATIENT GOALS Improve dressing ADLs with less pain   OBJECTIVE:   DIAGNOSTIC FINDINGS:  MRI 11/15/21: IMPRESSION: Overall mild multilevel degenerative changes as detailed above without high-grade stenosis. There is marrow edema eccentric to the left at L2-L3 that may be a source reported symptoms.    PATIENT SURVEYS:  FOTO 49; predicted 16   SENSATION: WFL  MUSCLE LENGTH: Hamstrings: Right 80 deg; Left 80 deg Thomas test: To be assessed  POSTURE: forward head and increased thoracic kyphosis  PALPATION: TTP L>R glute med, upper glute max, L lumbar paraspinal Lumbar hypomobility L1-L5 Sacral hypomobility into anterior rotation L>R; posterior rotation [facility]    LOWER EXTREMITY MMT:    MMT Right eval Left eval Right 9/27 Left 9/27  Hip flexion 4+/5 3+/5 4+ 4  Hip extension 3/5 3-/5 4 4   Hip abduction 4/5* 3+/5 4- 4-  Hip adduction      Hip internal rotation 4+/5 4+/5    Hip external rotation 4+/5 4+/5    Knee flexion 5/5 5/5    Knee extension 5/5 5/5    Ankle dorsiflexion      Ankle plantarflexion      Ankle inversion      Ankle eversion       (Blank rows = not tested)  *Difficulty tolerating L sidelying  LUMBAR SPECIAL TESTS:  Straight leg raise test: Negative and SI Compression/distraction test: Negative Unable to actively perform SLR  TRANSFERS:  Sit<>stand utilizing bilat UEs, decrease hip hinge forward   Sup to sit t/f coming up into long sit and then pivoting legs off EOB to keep  weight off L hip  TODAY'S TREATMENT  04/05/22 Treadmill warm up 1.8 mph x 5 min  Standing  QL stretch 3x30 sec  Hip hike x10  QL stretch on 4" step x 20 sec  Quadruped  Oblique contraction 2x10  Manual therapy:  STM & TPR L glute, bilat TSA, and QL  Grade II to III lumbar mobilization for rotation and QL stretch  L pelvic posterior rotation   03/29/22 Treadmill warm up 1.9 mph x 6 min Manual therapy:  STM & TPR L glute, bilat TSA, and QL Prone    Hip ext 2x10 R&L    Hip IR 2x10 red TB    Hip ER 2x10 red TB Sidelying   Hip abd 2x10 Sitting  Hip flexion red TB 2x10 Quadruped  Oblique contraction 2x10  Bird dog 2x10 Supine  Diaphragmatic inhalation with straw 3x 2 min   03/22/22 Treadmill warm up 2.2 mph x 5 min Manual therapy:  STM & TPR L glute, bilat TSA, and QL  PROM hip ext, abd, IR/ER  Prone    Hip ext 2x10 R&L    Hip IR 2x10 yellow TB     Quadruped    Bird dog x10    Oblique contraction x10   03/15/22 Treadmill warm up 1.8 mph x 5 min Manual therapy:  STM & TPR thoracolumbar paraspinals, low traps, L glute, R TSA, and QL  PROM hip ext, abd, IR/ER Trigger Point Dry-Needling  Treatment instructions: Expect mild to moderate muscle soreness. S/S of pneumothorax if dry needled over a lung field, and to seek immediate medical attention should they occur. Patient verbalized understanding of these instructions and education.  Patient Consent Given: Yes Education handout provided: Previously provided Muscles treated: Mid trap, Low trap Electrical stimulation performed: No Parameters: N/A Treatment response/outcome: Twitch response, increased muscle length  Therex:  Prone  Hip ext 2x10 R&L  Child's pose x20 sec, with thread the needle x30 sec   Sitting  Hip ER red TB 2x10    PATIENT EDUCATION:  Education details: Reviewed TPDN instructions (see above), new HEP Person educated: Patient Education method: Tourist information centre manager Education  comprehension: verbalized understanding   HOME EXERCISE PROGRAM: Access Code [email or web address]  ASSESSMENT:  CLINICAL IMPRESSION: Pt is demonstrating improving hip strength. Continued decreased tolerance to L sidelying (still gets pain with pressure on greater trochanter). Increased pain noted after performing hip hiking exercise.    OBJECTIVE IMPAIRMENTS Abnormal gait, decreased activity tolerance, decreased mobility, decreased ROM, decreased strength, hypomobility, increased fascial restrictions, increased muscle spasms, impaired flexibility, postural dysfunction, and pain.   ACTIVITY LIMITATIONS squatting, transfers, bed mobility, toileting, dressing, and locomotion level  PARTICIPATION LIMITATIONS: interpersonal relationship and community activity  PERSONAL FACTORS Age, Past/current experiences, and Time since onset of injury/illness/exacerbation are also affecting patient's functional outcome.   REHAB POTENTIAL: Good  CLINICAL DECISION MAKING: Evolving/moderate complexity  EVALUATION COMPLEXITY: Moderate   GOALS: Goals reviewed with patient? Yes  SHORT TERM GOALS: Target date: 03/09/2022  Pt will remain ind with her HEP Baseline: Goal status: IN PROGRESS    LONG TERM GOALS: Target date: 04/05/2022  Pt will be ind with maintaining her HEP Baseline:  Goal status: IN PROGRESS  2.  Pt will demo at least 4+/5 hip flexor and hip extensor strength for improved transfers Baseline:  Goal status: IN PROGRESS  3.  Pt will report decreased pain by >/= 50% Baseline:  Goal status: IN PROGRESS  4.  Pt will have improved FOTO score to at least 58 Baseline:  Goal  status: IN PROGRESS  5.  Pt will be able to tolerate L sidelying for improved bed mobility Baseline:  Goal status: IN PROGRESS    PLAN: PT FREQUENCY: 1x/week (per pt request)  PT DURATION: 8 weeks  PLANNED INTERVENTIONS: Therapeutic exercises, Therapeutic activity, Neuromuscular re-education, Balance training, Gait  training, Patient/Family education, Self Care, Joint mobilization, Dry Needling, Electrical stimulation, Spinal mobilization, Cryotherapy, Moist heat, Taping, Traction, Ionotophoresis 4mg /ml Dexamethasone, Manual therapy, and Re-evaluation.  PLAN FOR NEXT SESSION: Assess response to HEP. Continue hip flexor and extensor strengthening. Manual work for thoraco lumbar mobility.    Kesleigh Morson April Dell Ponto, PT, DPT 04/05/2022, 2:11 PM

## 2022-04-06 ENCOUNTER — Encounter: Payer: Self-pay | Admitting: Physician Assistant

## 2022-04-06 ENCOUNTER — Encounter: Payer: Self-pay | Admitting: Physical Therapy

## 2022-04-07 DIAGNOSIS — M9904 Segmental and somatic dysfunction of sacral region: Secondary | ICD-10-CM | POA: Diagnosis not present

## 2022-04-07 DIAGNOSIS — M9902 Segmental and somatic dysfunction of thoracic region: Secondary | ICD-10-CM | POA: Diagnosis not present

## 2022-04-07 DIAGNOSIS — M5136 Other intervertebral disc degeneration, lumbar region: Secondary | ICD-10-CM | POA: Diagnosis not present

## 2022-04-07 DIAGNOSIS — M5442 Lumbago with sciatica, left side: Secondary | ICD-10-CM | POA: Diagnosis not present

## 2022-04-07 DIAGNOSIS — M9903 Segmental and somatic dysfunction of lumbar region: Secondary | ICD-10-CM | POA: Diagnosis not present

## 2022-04-07 DIAGNOSIS — M4125 Other idiopathic scoliosis, thoracolumbar region: Secondary | ICD-10-CM | POA: Diagnosis not present

## 2022-04-10 ENCOUNTER — Encounter: Payer: Self-pay | Admitting: Physical Therapy

## 2022-04-11 DIAGNOSIS — M5442 Lumbago with sciatica, left side: Secondary | ICD-10-CM | POA: Diagnosis not present

## 2022-04-11 DIAGNOSIS — M9903 Segmental and somatic dysfunction of lumbar region: Secondary | ICD-10-CM | POA: Diagnosis not present

## 2022-04-11 DIAGNOSIS — M9904 Segmental and somatic dysfunction of sacral region: Secondary | ICD-10-CM | POA: Diagnosis not present

## 2022-04-11 DIAGNOSIS — M9902 Segmental and somatic dysfunction of thoracic region: Secondary | ICD-10-CM | POA: Diagnosis not present

## 2022-04-11 DIAGNOSIS — M5136 Other intervertebral disc degeneration, lumbar region: Secondary | ICD-10-CM | POA: Diagnosis not present

## 2022-04-11 DIAGNOSIS — M4125 Other idiopathic scoliosis, thoracolumbar region: Secondary | ICD-10-CM | POA: Diagnosis not present

## 2022-04-12 ENCOUNTER — Ambulatory Visit: Payer: Medicare Other | Admitting: Physical Therapy

## 2022-04-18 DIAGNOSIS — M4125 Other idiopathic scoliosis, thoracolumbar region: Secondary | ICD-10-CM | POA: Diagnosis not present

## 2022-04-18 DIAGNOSIS — M9904 Segmental and somatic dysfunction of sacral region: Secondary | ICD-10-CM | POA: Diagnosis not present

## 2022-04-18 DIAGNOSIS — M5136 Other intervertebral disc degeneration, lumbar region: Secondary | ICD-10-CM | POA: Diagnosis not present

## 2022-04-18 DIAGNOSIS — M5442 Lumbago with sciatica, left side: Secondary | ICD-10-CM | POA: Diagnosis not present

## 2022-04-18 DIAGNOSIS — M9902 Segmental and somatic dysfunction of thoracic region: Secondary | ICD-10-CM | POA: Diagnosis not present

## 2022-04-18 DIAGNOSIS — M9903 Segmental and somatic dysfunction of lumbar region: Secondary | ICD-10-CM | POA: Diagnosis not present

## 2022-04-19 ENCOUNTER — Ambulatory Visit: Payer: Medicare Other | Admitting: Physical Therapy

## 2022-04-21 ENCOUNTER — Ambulatory Visit (INDEPENDENT_AMBULATORY_CARE_PROVIDER_SITE_OTHER): Payer: Medicare Other | Admitting: Physician Assistant

## 2022-04-21 VITALS — BP 119/57 | HR 96 | Ht 60.0 in | Wt 110.0 lb

## 2022-04-21 DIAGNOSIS — M47819 Spondylosis without myelopathy or radiculopathy, site unspecified: Secondary | ICD-10-CM | POA: Diagnosis not present

## 2022-04-21 DIAGNOSIS — M545 Low back pain, unspecified: Secondary | ICD-10-CM | POA: Diagnosis not present

## 2022-04-21 DIAGNOSIS — G8929 Other chronic pain: Secondary | ICD-10-CM

## 2022-04-21 DIAGNOSIS — Z79899 Other long term (current) drug therapy: Secondary | ICD-10-CM

## 2022-04-21 DIAGNOSIS — R748 Abnormal levels of other serum enzymes: Secondary | ICD-10-CM | POA: Diagnosis not present

## 2022-04-21 LAB — COMPLETE METABOLIC PANEL WITH GFR
AG Ratio: 2.6 (calc) — ABNORMAL HIGH (ref 1.0–2.5)
ALT: 18 U/L (ref 6–29)
AST: 28 U/L (ref 10–35)
Albumin: 4.4 g/dL (ref 3.6–5.1)
Alkaline phosphatase (APISO): 65 U/L (ref 37–153)
BUN: 18 mg/dL (ref 7–25)
CO2: 27 mmol/L (ref 20–32)
Calcium: 9.7 mg/dL (ref 8.6–10.4)
Chloride: 106 mmol/L (ref 98–110)
Creat: 0.95 mg/dL (ref 0.50–1.05)
Globulin: 1.7 g/dL (calc) — ABNORMAL LOW (ref 1.9–3.7)
Glucose, Bld: 87 mg/dL (ref 65–99)
Potassium: 4.4 mmol/L (ref 3.5–5.3)
Sodium: 142 mmol/L (ref 135–146)
Total Bilirubin: 0.5 mg/dL (ref 0.2–1.2)
Total Protein: 6.1 g/dL (ref 6.1–8.1)
eGFR: 67 mL/min/{1.73_m2} (ref >=60)

## 2022-04-21 NOTE — Progress Notes (Signed)
   Established Patient Office Visit  Subjective   Patient ID: Judy Russell, female    DOB: [birth year]  Age: 66 y.o. MRN: [id number]  Chief Complaint  Patient presents with  . Follow-up    HPI  {History (Optional):23778}  ROS    Objective:     BP (!) 119/57   Pulse 96   Ht 5' (1.524 m)   Wt 110 lb (49.9 kg)   SpO2 99%   BMI 21.48 kg/m  BP Readings from Last 3 Encounters:  04/21/22 (!) 119/57  11/01/21 (!) 142/67  10/06/21 122/77   Wt Readings from Last 3 Encounters:  04/21/22 110 lb (49.9 kg)  11/01/21 116 lb (52.6 kg)  10/06/21 119 lb (54 kg)      Physical Exam       Assessment & Plan:  Marland KitchenMarland KitchenRoseline was seen today for follow-up.  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

## 2022-04-24 ENCOUNTER — Encounter: Payer: Self-pay | Admitting: Physician Assistant

## 2022-04-24 NOTE — Progress Notes (Signed)
Globulin up just .1g but at least up.

## 2022-04-25 ENCOUNTER — Encounter: Payer: Self-pay | Admitting: Physician Assistant

## 2022-04-25 DIAGNOSIS — M5136 Other intervertebral disc degeneration, lumbar region: Secondary | ICD-10-CM | POA: Diagnosis not present

## 2022-04-25 DIAGNOSIS — M9904 Segmental and somatic dysfunction of sacral region: Secondary | ICD-10-CM | POA: Diagnosis not present

## 2022-04-25 DIAGNOSIS — M5442 Lumbago with sciatica, left side: Secondary | ICD-10-CM | POA: Diagnosis not present

## 2022-04-25 DIAGNOSIS — M9903 Segmental and somatic dysfunction of lumbar region: Secondary | ICD-10-CM | POA: Diagnosis not present

## 2022-04-25 DIAGNOSIS — M4125 Other idiopathic scoliosis, thoracolumbar region: Secondary | ICD-10-CM | POA: Diagnosis not present

## 2022-04-25 DIAGNOSIS — M9902 Segmental and somatic dysfunction of thoracic region: Secondary | ICD-10-CM | POA: Diagnosis not present

## 2022-05-02 DIAGNOSIS — M5442 Lumbago with sciatica, left side: Secondary | ICD-10-CM | POA: Diagnosis not present

## 2022-05-02 DIAGNOSIS — M9903 Segmental and somatic dysfunction of lumbar region: Secondary | ICD-10-CM | POA: Diagnosis not present

## 2022-05-02 DIAGNOSIS — M5136 Other intervertebral disc degeneration, lumbar region: Secondary | ICD-10-CM | POA: Diagnosis not present

## 2022-05-02 DIAGNOSIS — M4125 Other idiopathic scoliosis, thoracolumbar region: Secondary | ICD-10-CM | POA: Diagnosis not present

## 2022-05-02 DIAGNOSIS — M9902 Segmental and somatic dysfunction of thoracic region: Secondary | ICD-10-CM | POA: Diagnosis not present

## 2022-05-02 DIAGNOSIS — M9904 Segmental and somatic dysfunction of sacral region: Secondary | ICD-10-CM | POA: Diagnosis not present

## 2022-05-09 DIAGNOSIS — M9902 Segmental and somatic dysfunction of thoracic region: Secondary | ICD-10-CM | POA: Diagnosis not present

## 2022-05-09 DIAGNOSIS — M9904 Segmental and somatic dysfunction of sacral region: Secondary | ICD-10-CM | POA: Diagnosis not present

## 2022-05-09 DIAGNOSIS — M4125 Other idiopathic scoliosis, thoracolumbar region: Secondary | ICD-10-CM | POA: Diagnosis not present

## 2022-05-09 DIAGNOSIS — M9903 Segmental and somatic dysfunction of lumbar region: Secondary | ICD-10-CM | POA: Diagnosis not present

## 2022-05-09 DIAGNOSIS — M5136 Other intervertebral disc degeneration, lumbar region: Secondary | ICD-10-CM | POA: Diagnosis not present

## 2022-05-09 DIAGNOSIS — M5442 Lumbago with sciatica, left side: Secondary | ICD-10-CM | POA: Diagnosis not present

## 2022-05-17 DIAGNOSIS — M9904 Segmental and somatic dysfunction of sacral region: Secondary | ICD-10-CM | POA: Diagnosis not present

## 2022-05-17 DIAGNOSIS — M9902 Segmental and somatic dysfunction of thoracic region: Secondary | ICD-10-CM | POA: Diagnosis not present

## 2022-05-17 DIAGNOSIS — M9903 Segmental and somatic dysfunction of lumbar region: Secondary | ICD-10-CM | POA: Diagnosis not present

## 2022-05-17 DIAGNOSIS — M5136 Other intervertebral disc degeneration, lumbar region: Secondary | ICD-10-CM | POA: Diagnosis not present

## 2022-05-17 DIAGNOSIS — M5442 Lumbago with sciatica, left side: Secondary | ICD-10-CM | POA: Diagnosis not present

## 2022-05-17 DIAGNOSIS — M4125 Other idiopathic scoliosis, thoracolumbar region: Secondary | ICD-10-CM | POA: Diagnosis not present

## 2022-05-30 DIAGNOSIS — M5442 Lumbago with sciatica, left side: Secondary | ICD-10-CM | POA: Diagnosis not present

## 2022-05-30 DIAGNOSIS — M9902 Segmental and somatic dysfunction of thoracic region: Secondary | ICD-10-CM | POA: Diagnosis not present

## 2022-05-30 DIAGNOSIS — M5136 Other intervertebral disc degeneration, lumbar region: Secondary | ICD-10-CM | POA: Diagnosis not present

## 2022-05-30 DIAGNOSIS — M4125 Other idiopathic scoliosis, thoracolumbar region: Secondary | ICD-10-CM | POA: Diagnosis not present

## 2022-05-30 DIAGNOSIS — M9904 Segmental and somatic dysfunction of sacral region: Secondary | ICD-10-CM | POA: Diagnosis not present

## 2022-05-30 DIAGNOSIS — M9903 Segmental and somatic dysfunction of lumbar region: Secondary | ICD-10-CM | POA: Diagnosis not present

## 2022-06-06 DIAGNOSIS — M4125 Other idiopathic scoliosis, thoracolumbar region: Secondary | ICD-10-CM | POA: Diagnosis not present

## 2022-06-06 DIAGNOSIS — M5136 Other intervertebral disc degeneration, lumbar region: Secondary | ICD-10-CM | POA: Diagnosis not present

## 2022-06-06 DIAGNOSIS — M9902 Segmental and somatic dysfunction of thoracic region: Secondary | ICD-10-CM | POA: Diagnosis not present

## 2022-06-06 DIAGNOSIS — M5442 Lumbago with sciatica, left side: Secondary | ICD-10-CM | POA: Diagnosis not present

## 2022-06-06 DIAGNOSIS — M9904 Segmental and somatic dysfunction of sacral region: Secondary | ICD-10-CM | POA: Diagnosis not present

## 2022-06-06 DIAGNOSIS — M9903 Segmental and somatic dysfunction of lumbar region: Secondary | ICD-10-CM | POA: Diagnosis not present

## 2022-06-15 DIAGNOSIS — M5442 Lumbago with sciatica, left side: Secondary | ICD-10-CM | POA: Diagnosis not present

## 2022-06-15 DIAGNOSIS — M9902 Segmental and somatic dysfunction of thoracic region: Secondary | ICD-10-CM | POA: Diagnosis not present

## 2022-06-15 DIAGNOSIS — M5136 Other intervertebral disc degeneration, lumbar region: Secondary | ICD-10-CM | POA: Diagnosis not present

## 2022-06-15 DIAGNOSIS — M9903 Segmental and somatic dysfunction of lumbar region: Secondary | ICD-10-CM | POA: Diagnosis not present

## 2022-06-15 DIAGNOSIS — M9904 Segmental and somatic dysfunction of sacral region: Secondary | ICD-10-CM | POA: Diagnosis not present

## 2022-06-15 DIAGNOSIS — M4125 Other idiopathic scoliosis, thoracolumbar region: Secondary | ICD-10-CM | POA: Diagnosis not present

## 2022-06-20 DIAGNOSIS — M4125 Other idiopathic scoliosis, thoracolumbar region: Secondary | ICD-10-CM | POA: Diagnosis not present

## 2022-06-20 DIAGNOSIS — M5442 Lumbago with sciatica, left side: Secondary | ICD-10-CM | POA: Diagnosis not present

## 2022-06-20 DIAGNOSIS — M9903 Segmental and somatic dysfunction of lumbar region: Secondary | ICD-10-CM | POA: Diagnosis not present

## 2022-06-20 DIAGNOSIS — M5136 Other intervertebral disc degeneration, lumbar region: Secondary | ICD-10-CM | POA: Diagnosis not present

## 2022-06-20 DIAGNOSIS — M9904 Segmental and somatic dysfunction of sacral region: Secondary | ICD-10-CM | POA: Diagnosis not present

## 2022-06-20 DIAGNOSIS — M9902 Segmental and somatic dysfunction of thoracic region: Secondary | ICD-10-CM | POA: Diagnosis not present

## 2022-06-27 ENCOUNTER — Other Ambulatory Visit: Payer: Self-pay | Admitting: Physician Assistant

## 2022-06-27 DIAGNOSIS — M47819 Spondylosis without myelopathy or radiculopathy, site unspecified: Secondary | ICD-10-CM

## 2022-06-27 DIAGNOSIS — G894 Chronic pain syndrome: Secondary | ICD-10-CM

## 2022-06-29 ENCOUNTER — Other Ambulatory Visit: Payer: Self-pay | Admitting: Physician Assistant

## 2022-06-29 DIAGNOSIS — M9904 Segmental and somatic dysfunction of sacral region: Secondary | ICD-10-CM | POA: Diagnosis not present

## 2022-06-29 DIAGNOSIS — M4125 Other idiopathic scoliosis, thoracolumbar region: Secondary | ICD-10-CM | POA: Diagnosis not present

## 2022-06-29 DIAGNOSIS — M9902 Segmental and somatic dysfunction of thoracic region: Secondary | ICD-10-CM | POA: Diagnosis not present

## 2022-06-29 DIAGNOSIS — M5136 Other intervertebral disc degeneration, lumbar region: Secondary | ICD-10-CM | POA: Diagnosis not present

## 2022-06-29 DIAGNOSIS — M5442 Lumbago with sciatica, left side: Secondary | ICD-10-CM | POA: Diagnosis not present

## 2022-06-29 DIAGNOSIS — M9903 Segmental and somatic dysfunction of lumbar region: Secondary | ICD-10-CM | POA: Diagnosis not present

## 2022-07-28 ENCOUNTER — Telehealth: Payer: Self-pay

## 2022-07-28 NOTE — Telephone Encounter (Signed)
Patient came into office to drop off form to be completed by [facility] for her parking placard, form placed in [place] box, thanks.

## 2022-07-31 NOTE — Telephone Encounter (Signed)
Form complete and at the front for pick up. Please let patient know.

## 2022-08-02 DIAGNOSIS — M9903 Segmental and somatic dysfunction of lumbar region: Secondary | ICD-10-CM | POA: Diagnosis not present

## 2022-08-02 DIAGNOSIS — M4125 Other idiopathic scoliosis, thoracolumbar region: Secondary | ICD-10-CM | POA: Diagnosis not present

## 2022-08-02 DIAGNOSIS — M5136 Other intervertebral disc degeneration, lumbar region: Secondary | ICD-10-CM | POA: Diagnosis not present

## 2022-08-02 DIAGNOSIS — M9901 Segmental and somatic dysfunction of cervical region: Secondary | ICD-10-CM | POA: Diagnosis not present

## 2022-08-02 DIAGNOSIS — M9902 Segmental and somatic dysfunction of thoracic region: Secondary | ICD-10-CM | POA: Diagnosis not present

## 2022-08-02 DIAGNOSIS — M9904 Segmental and somatic dysfunction of sacral region: Secondary | ICD-10-CM | POA: Diagnosis not present

## 2022-08-02 DIAGNOSIS — M545 Low back pain, unspecified: Secondary | ICD-10-CM | POA: Diagnosis not present

## 2022-08-09 DIAGNOSIS — M9902 Segmental and somatic dysfunction of thoracic region: Secondary | ICD-10-CM | POA: Diagnosis not present

## 2022-08-09 DIAGNOSIS — M9901 Segmental and somatic dysfunction of cervical region: Secondary | ICD-10-CM | POA: Diagnosis not present

## 2022-08-09 DIAGNOSIS — M9903 Segmental and somatic dysfunction of lumbar region: Secondary | ICD-10-CM | POA: Diagnosis not present

## 2022-08-09 DIAGNOSIS — M545 Low back pain, unspecified: Secondary | ICD-10-CM | POA: Diagnosis not present

## 2022-08-09 DIAGNOSIS — M4125 Other idiopathic scoliosis, thoracolumbar region: Secondary | ICD-10-CM | POA: Diagnosis not present

## 2022-08-09 DIAGNOSIS — M9904 Segmental and somatic dysfunction of sacral region: Secondary | ICD-10-CM | POA: Diagnosis not present

## 2022-08-09 DIAGNOSIS — M5136 Other intervertebral disc degeneration, lumbar region: Secondary | ICD-10-CM | POA: Diagnosis not present

## 2022-08-16 DIAGNOSIS — M5136 Other intervertebral disc degeneration, lumbar region: Secondary | ICD-10-CM | POA: Diagnosis not present

## 2022-08-16 DIAGNOSIS — M9902 Segmental and somatic dysfunction of thoracic region: Secondary | ICD-10-CM | POA: Diagnosis not present

## 2022-08-16 DIAGNOSIS — M4125 Other idiopathic scoliosis, thoracolumbar region: Secondary | ICD-10-CM | POA: Diagnosis not present

## 2022-08-16 DIAGNOSIS — M9903 Segmental and somatic dysfunction of lumbar region: Secondary | ICD-10-CM | POA: Diagnosis not present

## 2022-08-16 DIAGNOSIS — M545 Low back pain, unspecified: Secondary | ICD-10-CM | POA: Diagnosis not present

## 2022-08-16 DIAGNOSIS — M9904 Segmental and somatic dysfunction of sacral region: Secondary | ICD-10-CM | POA: Diagnosis not present

## 2022-08-16 DIAGNOSIS — M9901 Segmental and somatic dysfunction of cervical region: Secondary | ICD-10-CM | POA: Diagnosis not present

## 2022-08-23 DIAGNOSIS — M4125 Other idiopathic scoliosis, thoracolumbar region: Secondary | ICD-10-CM | POA: Diagnosis not present

## 2022-08-23 DIAGNOSIS — M545 Low back pain, unspecified: Secondary | ICD-10-CM | POA: Diagnosis not present

## 2022-08-23 DIAGNOSIS — M5136 Other intervertebral disc degeneration, lumbar region: Secondary | ICD-10-CM | POA: Diagnosis not present

## 2022-08-23 DIAGNOSIS — M9903 Segmental and somatic dysfunction of lumbar region: Secondary | ICD-10-CM | POA: Diagnosis not present

## 2022-08-23 DIAGNOSIS — M9902 Segmental and somatic dysfunction of thoracic region: Secondary | ICD-10-CM | POA: Diagnosis not present

## 2022-08-23 DIAGNOSIS — M9904 Segmental and somatic dysfunction of sacral region: Secondary | ICD-10-CM | POA: Diagnosis not present

## 2022-08-23 DIAGNOSIS — M9901 Segmental and somatic dysfunction of cervical region: Secondary | ICD-10-CM | POA: Diagnosis not present

## 2022-08-29 ENCOUNTER — Encounter: Payer: Self-pay | Admitting: Physician Assistant

## 2022-08-29 ENCOUNTER — Ambulatory Visit (INDEPENDENT_AMBULATORY_CARE_PROVIDER_SITE_OTHER): Payer: Medicare Other | Admitting: Physician Assistant

## 2022-08-29 VITALS — BP 163/74 | HR 50 | Ht 60.0 in | Wt 117.0 lb

## 2022-08-29 DIAGNOSIS — Z13220 Encounter for screening for lipoid disorders: Secondary | ICD-10-CM | POA: Diagnosis not present

## 2022-08-29 DIAGNOSIS — M545 Low back pain, unspecified: Secondary | ICD-10-CM | POA: Diagnosis not present

## 2022-08-29 DIAGNOSIS — G8929 Other chronic pain: Secondary | ICD-10-CM | POA: Diagnosis not present

## 2022-08-29 DIAGNOSIS — Z79899 Other long term (current) drug therapy: Secondary | ICD-10-CM | POA: Diagnosis not present

## 2022-08-29 DIAGNOSIS — F0630 Mood disorder due to known physiological condition, unspecified: Secondary | ICD-10-CM

## 2022-08-29 DIAGNOSIS — E039 Hypothyroidism, unspecified: Secondary | ICD-10-CM

## 2022-08-29 NOTE — Progress Notes (Signed)
Established Patient Office Visit  Subjective   Patient ID: Judy Russell, female    DOB: 1956-05-28  Age: 67 y.o. MRN: [id number]  Chief Complaint  Patient presents with   Follow-up    HPI Pt is a 67 yo female with chronic low back pain, hypothyroidism, mood disorder who presents to the clinic for medication refills.   Pt is doing well. She uses tramadol as needed for her chronic low back pain. She is taking lamictal, synthroid daily. Mood is good.   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 She is a little sore on her left flank but overall feeling blessed.   .. 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   Left leg pain 03/18/2017   Right foot pain 03/18/2017   History of colon resection 07/09/2017   History of irregular heartbeat 07/09/2017   Sinus bradycardia by electrocardiogram 07/09/2017   Hyperlipidemia 07/11/2017   Elevated blood pressure reading 07/11/2017   Memory changes 07/19/2017   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   Perennial allergic rhinitis 08/13/2017   CFS (chronic fatigue syndrome) 11/27/2017   Multiple food allergies 03/25/2018   Environmental allergies 03/25/2018   Callus of foot 07/29/2018   Tonsillith 07/30/2018   Cervical radiculopathy 08/21/2018   Chronic constipation 10/30/2018   Food allergy 01/29/2019   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   Seborrheic keratoses 05/14/2019   Suspected COVID-19 virus infection 09/11/2019   COVID-19 virus infection  09/22/2019   History of COVID-19 10/07/2019   Mold exposure 12/24/2019   Chest tightness 12/24/2019   Allergic reaction 01/09/2020   Scalp itch 01/12/2020   Unintended weight loss 02/18/2020   Loss of taste 02/18/2020   Tapeworm 04/02/2020   Toenail fungus 09/14/2020   Corn of toe 09/14/2020   Rectocele 12/06/2020   No energy 02/28/2021   Lack of motivation 02/28/2021   Bipolar 1 disorder, mixed ([place] [place] 03/21/2021   Hamstring strain, left, initial encounter 04/28/2021   Syncope 05/31/2021   Acute gastritis without hemorrhage 05/31/2021   Chronic bilateral low back pain without sciatica 07/25/2021   Fall 07/26/2021   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 07/27/2021   Chronic pain syndrome 01/10/2022   Abnormality of globulin 01/10/2022   Resolved Ambulatory Problems    Diagnosis Date Noted   URI 09/04/2009   Acute bronchitis 05/03/2016   Essential hypertension 07/19/2017   DDD (degenerative disc disease), cervical 08/21/2018   Past Medical History:  Diagnosis Date   Allergy    HSV-2 (herpes simplex virus 2) infection    Seizures ([facility]    Thyroid disease      ROS   See HPI>  Objective:     BP (!) 163/74   Pulse (!) 50   Ht 5' (1.524 m)   Wt 117 lb (53.1 kg)   SpO2 99%   BMI 22.85 kg/m  BP Readings from Last 3 Encounters:  08/29/22 (!) 163/74  04/21/22 (!) 119/57  11/01/21 (!) 142/67   Wt Readings from Last 3 Encounters:  08/29/22  117 lb (53.1 kg)  04/21/22 110 lb (49.9 kg)  11/01/21 116 lb (52.6 kg)      Physical Exam Vitals reviewed.  Constitutional:      Appearance: Normal appearance.  Cardiovascular:     Rate and Rhythm: Normal rate and regular rhythm.  Pulmonary:     Effort: Pulmonary effort is normal.     Breath sounds: Normal breath sounds.  Musculoskeletal:     Right lower leg: No edema.     Left lower leg: No edema.  Neurological:     General: No focal deficit present.     Mental Status: She is alert and oriented to person, place,  and time.  Psychiatric:        Mood and Affect: Mood normal.         Assessment & Plan:  Marland KitchenMarland KitchenShawndrea was seen today for follow-up.  Diagnoses and all orders for this visit:  Hypothyroidism, unspecified type -     TSH  Medication management -     TSH -     COMPLETE METABOLIC PANEL WITH GFR -     Lipid Panel w/reflex Direct LDL -     CBC w/Diff/Platelet  Screening for lipid disorders -     Lipid Panel w/reflex Direct LDL  Chronic bilateral low back pain without sciatica  Mood disorder in conditions classified elsewhere   Screening labs for medication management Will adjust medication as needed  Return in about 3 months (around 11/27/2022).    Iran Planas, PA-C

## 2022-08-30 DIAGNOSIS — M9902 Segmental and somatic dysfunction of thoracic region: Secondary | ICD-10-CM | POA: Diagnosis not present

## 2022-08-30 DIAGNOSIS — M9903 Segmental and somatic dysfunction of lumbar region: Secondary | ICD-10-CM | POA: Diagnosis not present

## 2022-08-30 DIAGNOSIS — M4125 Other idiopathic scoliosis, thoracolumbar region: Secondary | ICD-10-CM | POA: Diagnosis not present

## 2022-08-30 DIAGNOSIS — M545 Low back pain, unspecified: Secondary | ICD-10-CM | POA: Diagnosis not present

## 2022-08-30 DIAGNOSIS — M5136 Other intervertebral disc degeneration, lumbar region: Secondary | ICD-10-CM | POA: Diagnosis not present

## 2022-08-30 DIAGNOSIS — M9901 Segmental and somatic dysfunction of cervical region: Secondary | ICD-10-CM | POA: Diagnosis not present

## 2022-08-30 DIAGNOSIS — M9904 Segmental and somatic dysfunction of sacral region: Secondary | ICD-10-CM | POA: Diagnosis not present

## 2022-08-31 ENCOUNTER — Other Ambulatory Visit: Payer: Self-pay | Admitting: Physician Assistant

## 2022-08-31 IMAGING — DX DG LUMBAR SPINE COMPLETE 4+V
5 series · 5 of 5 positions shown · non-contrast
Comparison: 11/05/2017

CLINICAL DATA: Worsening chronic low back pain.

EXAM:
LUMBAR SPINE - COMPLETE 4+ VIEW

[l-spine ap]
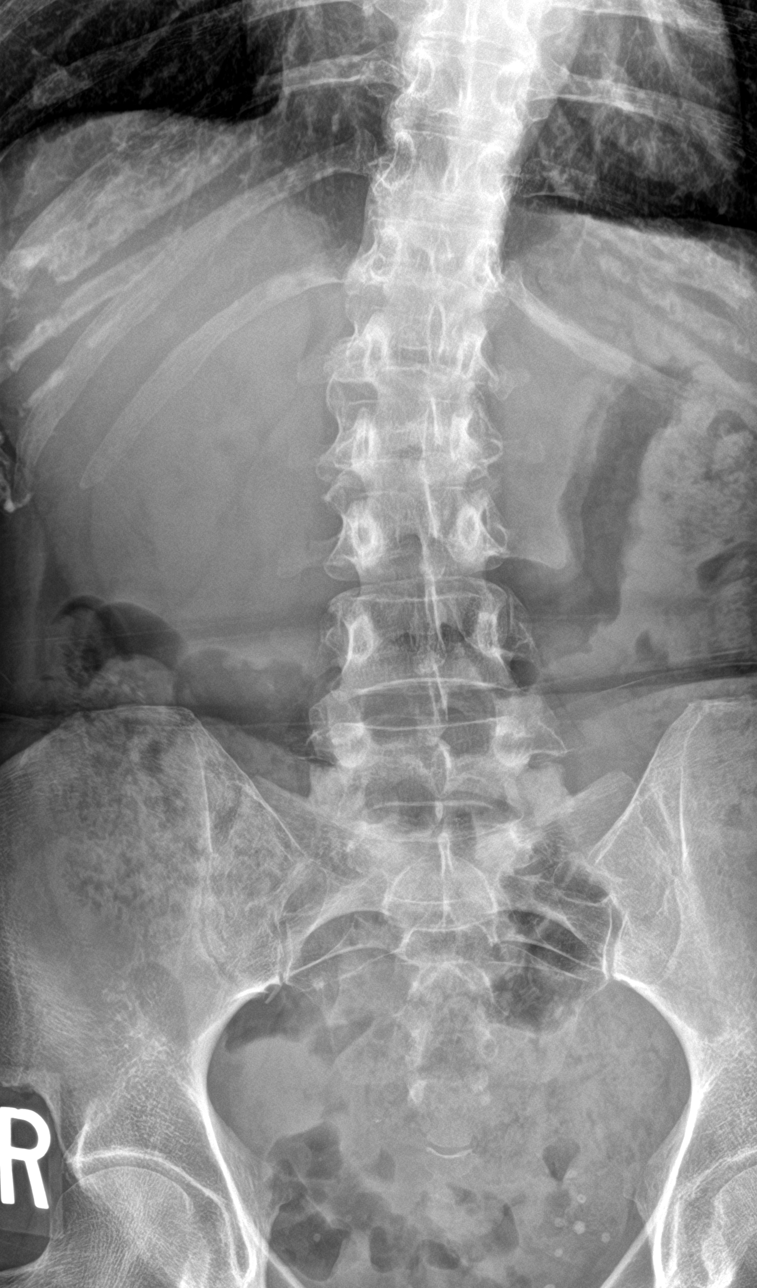

[l-spine obl (1 of 2)]
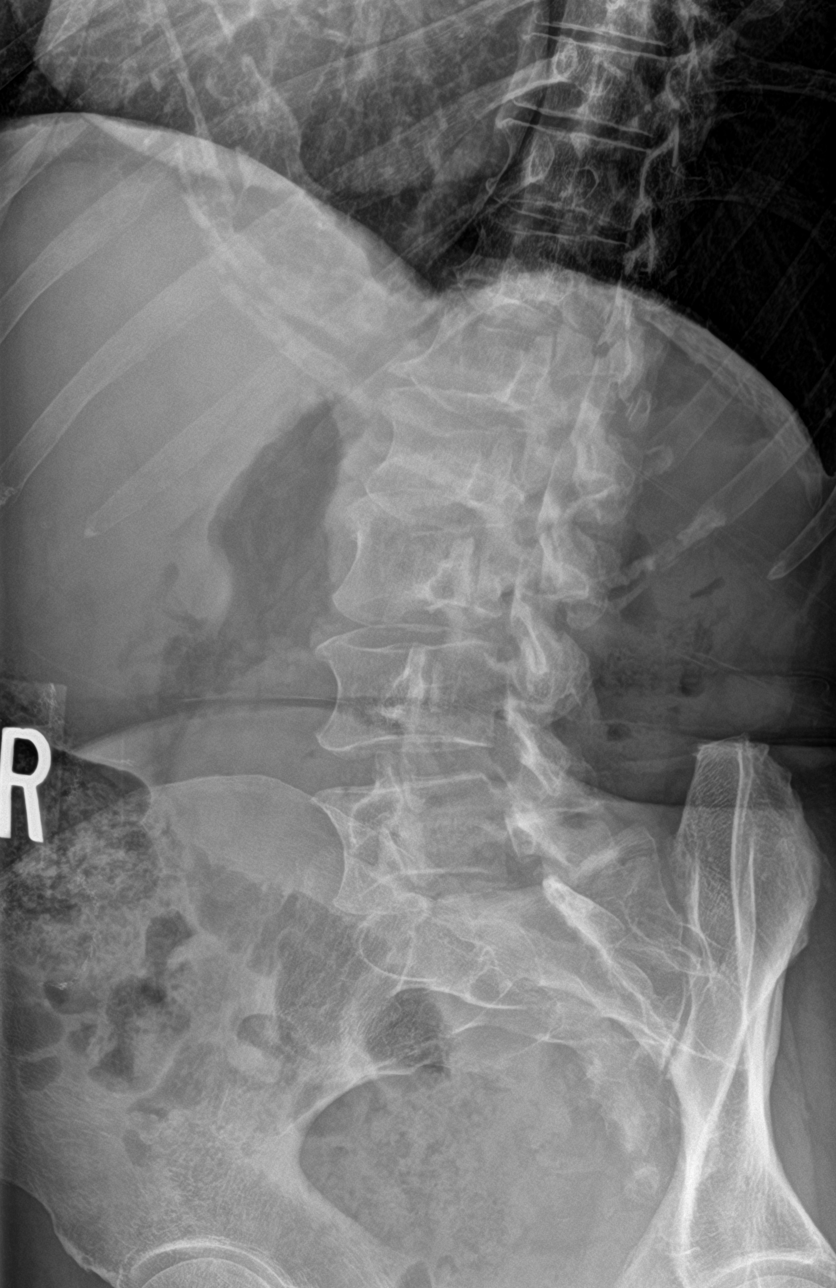

[l-spine obl (2 of 2)]
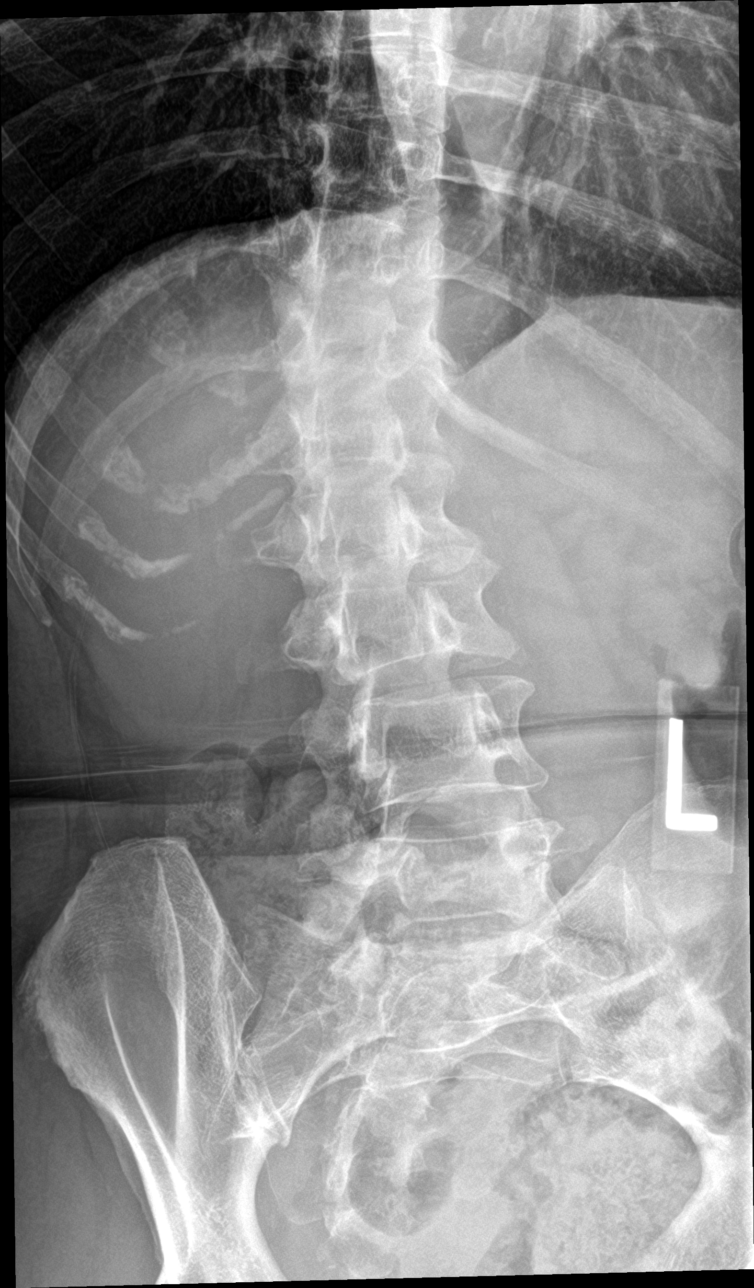

[l-spine lat]
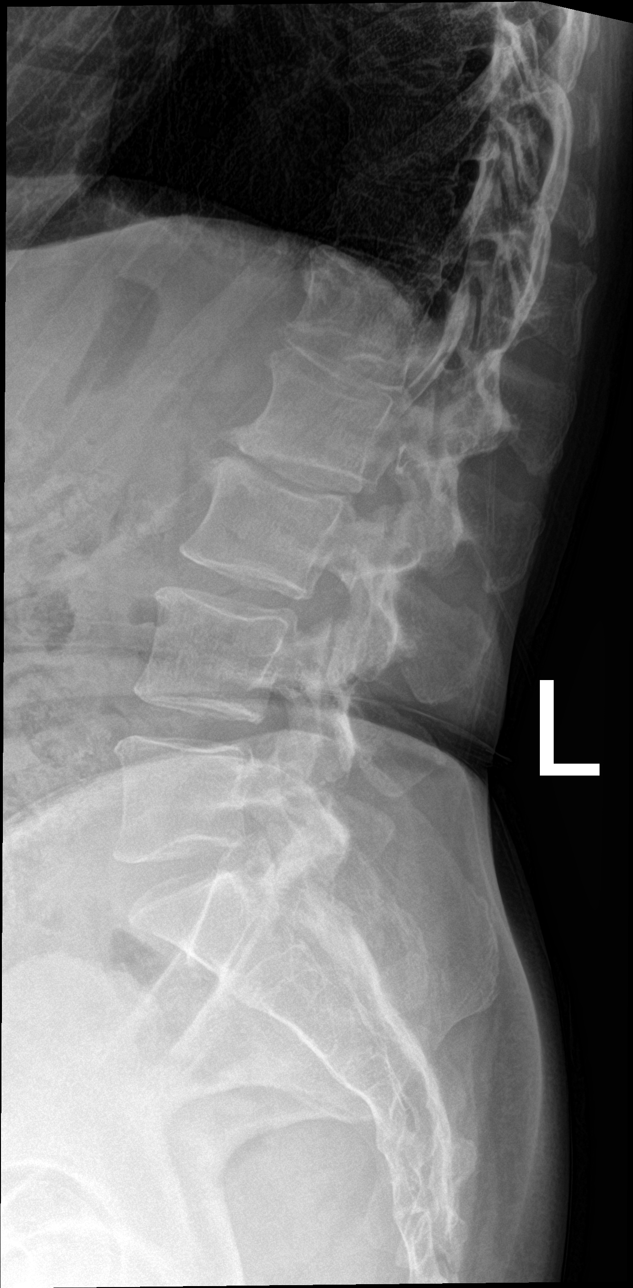

[l-spine spot]
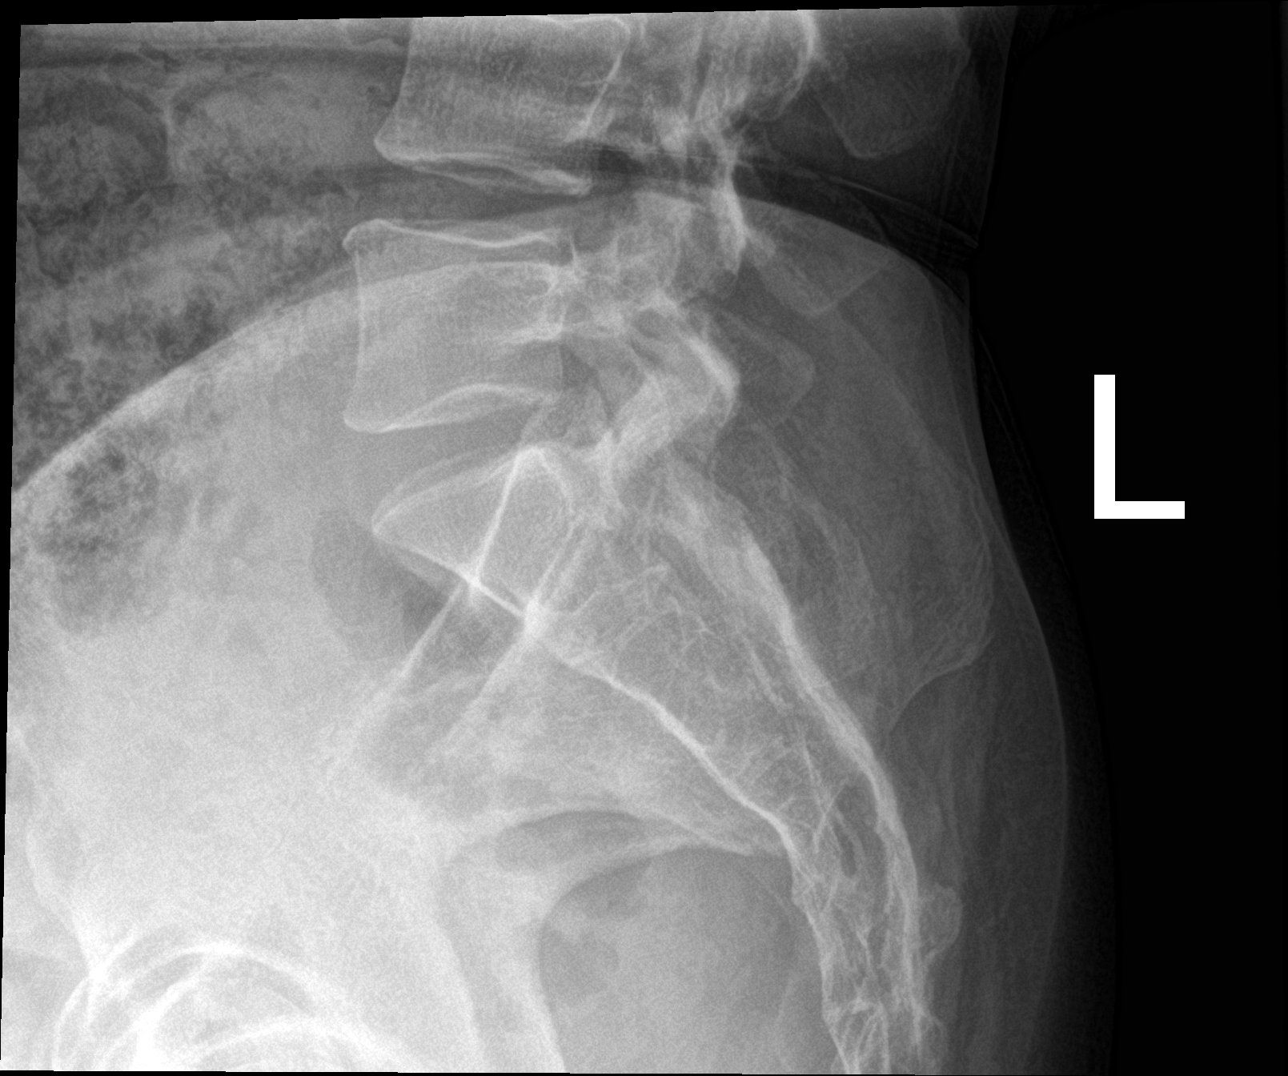

[5 of 5 positions shown; findings below may reference images not displayed]

FINDINGS: There are 5 non rib-bearing lumbar type vertebrae. Thoracic
levoscoliosis is partially visualized. There is mild compensatory
dextroconvex curvature of the lumbar spine. There is no significant
listhesis. No fracture is identified. Disc degeneration at L2-3 is
mildly progressed with mild-to-moderate disc space narrowing,
degenerative endplate sclerosis, and anterior spurring. There is
minimal disc space narrowing at L3-4. Mild lower lumbar facet
arthrosis is noted.
IMPRESSION: Mildly progressive disc degeneration at L2-3.

## 2022-09-05 DIAGNOSIS — E039 Hypothyroidism, unspecified: Secondary | ICD-10-CM | POA: Diagnosis not present

## 2022-09-05 DIAGNOSIS — Z13220 Encounter for screening for lipoid disorders: Secondary | ICD-10-CM | POA: Diagnosis not present

## 2022-09-05 DIAGNOSIS — Z79899 Other long term (current) drug therapy: Secondary | ICD-10-CM | POA: Diagnosis not present

## 2022-09-06 DIAGNOSIS — M545 Low back pain, unspecified: Secondary | ICD-10-CM | POA: Diagnosis not present

## 2022-09-06 DIAGNOSIS — M4125 Other idiopathic scoliosis, thoracolumbar region: Secondary | ICD-10-CM | POA: Diagnosis not present

## 2022-09-06 DIAGNOSIS — M9904 Segmental and somatic dysfunction of sacral region: Secondary | ICD-10-CM | POA: Diagnosis not present

## 2022-09-06 DIAGNOSIS — M9901 Segmental and somatic dysfunction of cervical region: Secondary | ICD-10-CM | POA: Diagnosis not present

## 2022-09-06 DIAGNOSIS — M9902 Segmental and somatic dysfunction of thoracic region: Secondary | ICD-10-CM | POA: Diagnosis not present

## 2022-09-06 DIAGNOSIS — M9903 Segmental and somatic dysfunction of lumbar region: Secondary | ICD-10-CM | POA: Diagnosis not present

## 2022-09-06 DIAGNOSIS — M5136 Other intervertebral disc degeneration, lumbar region: Secondary | ICD-10-CM | POA: Diagnosis not present

## 2022-09-06 LAB — COMPLETE METABOLIC PANEL WITH GFR
AG Ratio: 2.1 (calc) (ref 1.0–2.5)
ALT: 26 U/L (ref 6–29)
AST: 33 U/L (ref 10–35)
Albumin: 4.2 g/dL (ref 3.6–5.1)
Alkaline phosphatase (APISO): 78 U/L (ref 37–153)
BUN: 15 mg/dL (ref 7–25)
CO2: 32 mmol/L (ref 20–32)
Calcium: 9 mg/dL (ref 8.6–10.4)
Chloride: 106 mmol/L (ref 98–110)
Creat: 0.92 mg/dL (ref 0.50–1.05)
Globulin: 2 g/dL (calc) (ref 1.9–3.7)
Glucose, Bld: 73 mg/dL (ref 65–99)
Potassium: 4.4 mmol/L (ref 3.5–5.3)
Sodium: 143 mmol/L (ref 135–146)
Total Bilirubin: 0.4 mg/dL (ref 0.2–1.2)
Total Protein: 6.2 g/dL (ref 6.1–8.1)
eGFR: 70 mL/min/{1.73_m2} (ref >=60)

## 2022-09-06 LAB — CBC WITH DIFFERENTIAL/PLATELET
Absolute Monocytes: 336 cells/uL (ref 200–950)
Basophils Absolute: 28 cells/uL (ref 0–200)
Basophils Relative: 0.6 %
Eosinophils Absolute: 198 cells/uL (ref 15–500)
Eosinophils Relative: 4.3 %
HCT: 40 % (ref 35.0–45.0)
Hemoglobin: 13.5 g/dL (ref 11.7–15.5)
Lymphs Abs: 1412 cells/uL (ref 850–3900)
MCH: 30.3 pg (ref 27.0–33.0)
MCHC: 33.8 g/dL (ref 32.0–36.0)
MCV: 89.9 fL (ref 80.0–100.0)
MPV: 9.3 fL (ref 7.5–12.5)
Monocytes Relative: 7.3 %
Neutro Abs: 2627 cells/uL (ref 1500–7800)
Neutrophils Relative %: 57.1 %
Platelets: 255 10*3/uL (ref 140–400)
RBC: 4.45 10*6/uL (ref 3.80–5.10)
RDW: 12.4 % (ref 11.0–15.0)
Total Lymphocyte: 30.7 %
WBC: 4.6 10*3/uL (ref 3.8–10.8)

## 2022-09-06 LAB — LIPID PANEL W/REFLEX DIRECT LDL
Cholesterol: 242 mg/dL — ABNORMAL HIGH (ref <200)
HDL: 72 mg/dL (ref >=50)
LDL Cholesterol (Calc): 151 mg/dL (calc) — ABNORMAL HIGH
Non-HDL Cholesterol (Calc): 170 mg/dL (calc) — ABNORMAL HIGH (ref <130)
Total CHOL/HDL Ratio: 3.4 (calc) (ref <5.0)
Triglycerides: 87 mg/dL (ref <150)

## 2022-09-06 LAB — TSH: TSH: 0.88 mIU/L (ref 0.40–4.50)

## 2022-09-06 NOTE — Progress Notes (Signed)
Vi,  Thyroid looks good.  LDL improved a little and HDL improved as well.  TG in normal range.   Your 10 year risk is above 7.5 percent and a statin is still suggested due to risk.   [name].The 10-year ASCVD risk score (Arnett DK, et al., 2019) is: 7.8%   Values used to calculate the score:     Age: 67 years     Sex: Female     Is Non-Hispanic African American: No     Diabetic: No     Tobacco smoker: No     Systolic Blood Pressure: [id number] mmHg     Is BP treated: No     HDL Cholesterol: 72 mg/dL     Total Cholesterol: 242 mg/dL

## 2022-09-12 DIAGNOSIS — M9901 Segmental and somatic dysfunction of cervical region: Secondary | ICD-10-CM | POA: Diagnosis not present

## 2022-09-12 DIAGNOSIS — M9903 Segmental and somatic dysfunction of lumbar region: Secondary | ICD-10-CM | POA: Diagnosis not present

## 2022-09-12 DIAGNOSIS — M4125 Other idiopathic scoliosis, thoracolumbar region: Secondary | ICD-10-CM | POA: Diagnosis not present

## 2022-09-12 DIAGNOSIS — M545 Low back pain, unspecified: Secondary | ICD-10-CM | POA: Diagnosis not present

## 2022-09-12 DIAGNOSIS — M9902 Segmental and somatic dysfunction of thoracic region: Secondary | ICD-10-CM | POA: Diagnosis not present

## 2022-09-12 DIAGNOSIS — M5136 Other intervertebral disc degeneration, lumbar region: Secondary | ICD-10-CM | POA: Diagnosis not present

## 2022-09-12 DIAGNOSIS — M9904 Segmental and somatic dysfunction of sacral region: Secondary | ICD-10-CM | POA: Diagnosis not present

## 2022-09-20 DIAGNOSIS — M9902 Segmental and somatic dysfunction of thoracic region: Secondary | ICD-10-CM | POA: Diagnosis not present

## 2022-09-20 DIAGNOSIS — M4125 Other idiopathic scoliosis, thoracolumbar region: Secondary | ICD-10-CM | POA: Diagnosis not present

## 2022-09-20 DIAGNOSIS — M9904 Segmental and somatic dysfunction of sacral region: Secondary | ICD-10-CM | POA: Diagnosis not present

## 2022-09-20 DIAGNOSIS — M5136 Other intervertebral disc degeneration, lumbar region: Secondary | ICD-10-CM | POA: Diagnosis not present

## 2022-09-20 DIAGNOSIS — M9901 Segmental and somatic dysfunction of cervical region: Secondary | ICD-10-CM | POA: Diagnosis not present

## 2022-09-20 DIAGNOSIS — M545 Low back pain, unspecified: Secondary | ICD-10-CM | POA: Diagnosis not present

## 2022-09-20 DIAGNOSIS — M9903 Segmental and somatic dysfunction of lumbar region: Secondary | ICD-10-CM | POA: Diagnosis not present

## 2022-09-27 DIAGNOSIS — M9902 Segmental and somatic dysfunction of thoracic region: Secondary | ICD-10-CM | POA: Diagnosis not present

## 2022-09-27 DIAGNOSIS — M4125 Other idiopathic scoliosis, thoracolumbar region: Secondary | ICD-10-CM | POA: Diagnosis not present

## 2022-09-27 DIAGNOSIS — M9901 Segmental and somatic dysfunction of cervical region: Secondary | ICD-10-CM | POA: Diagnosis not present

## 2022-09-27 DIAGNOSIS — M5136 Other intervertebral disc degeneration, lumbar region: Secondary | ICD-10-CM | POA: Diagnosis not present

## 2022-09-27 DIAGNOSIS — M9904 Segmental and somatic dysfunction of sacral region: Secondary | ICD-10-CM | POA: Diagnosis not present

## 2022-09-27 DIAGNOSIS — M545 Low back pain, unspecified: Secondary | ICD-10-CM | POA: Diagnosis not present

## 2022-09-27 DIAGNOSIS — M9903 Segmental and somatic dysfunction of lumbar region: Secondary | ICD-10-CM | POA: Diagnosis not present

## 2022-10-17 ENCOUNTER — Ambulatory Visit (INDEPENDENT_AMBULATORY_CARE_PROVIDER_SITE_OTHER): Payer: Medicare Other | Admitting: Physician Assistant

## 2022-10-17 ENCOUNTER — Encounter: Payer: Self-pay | Admitting: Physician Assistant

## 2022-10-17 VITALS — BP 136/67 | HR 67 | Ht 60.0 in | Wt 116.0 lb

## 2022-10-17 DIAGNOSIS — L309 Dermatitis, unspecified: Secondary | ICD-10-CM

## 2022-10-17 DIAGNOSIS — L600 Ingrowing nail: Secondary | ICD-10-CM | POA: Diagnosis not present

## 2022-10-17 MED ORDER — TRIAMCINOLONE ACETONIDE 0.1 % EX CREA: 1.0000 | TOPICAL_CREAM | Freq: Two times a day (BID) | CUTANEOUS | 0 refills | Status: DC

## 2022-10-17 NOTE — Progress Notes (Signed)
Acute Office Visit  Subjective:     Patient ID: Judy Russell, female    DOB: May 28, 1956, 67 y.o.   MRN: [id number]  Chief Complaint  Patient presents with   Follow-up    HPI Patient is in today for pain at the base of her right medial toenail and itchy rash.   Patient has noticed the toenail for the past few weeks.  She has been doing some Epson water soaks which seems to be helping.  She denies any trauma to the nail. No abscess or drainage.   She does have a itchy rash of her left lower anterior shin.  She has not tried anything to make better.  She denies any known contact with irritant.  .. 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   Left leg pain 03/18/2017   Right foot pain 03/18/2017   History of colon resection 07/09/2017   History of irregular heartbeat 07/09/2017   Sinus bradycardia by electrocardiogram 07/09/2017   Hyperlipidemia 07/11/2017   Elevated blood pressure reading 07/11/2017   Memory changes 07/19/2017   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   Perennial allergic rhinitis 08/13/2017   CFS (chronic fatigue syndrome) 11/27/2017   Multiple food allergies 03/25/2018   Environmental allergies 03/25/2018   Callus of foot 07/29/2018   Tonsillith 07/30/2018   Cervical radiculopathy 08/21/2018   Chronic constipation 10/30/2018   Food allergy 01/29/2019   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   Seborrheic keratoses 05/14/2019   Suspected COVID-19 virus infection 09/11/2019   COVID-19 virus infection 09/22/2019   History of COVID-19  10/07/2019   Mold exposure 12/24/2019   Chest tightness 12/24/2019   Allergic reaction 01/09/2020   Scalp itch 01/12/2020   Unintended weight loss 02/18/2020   Loss of taste 02/18/2020   Tapeworm 04/02/2020   Toenail fungus 09/14/2020   Corn of toe 09/14/2020   Rectocele 12/06/2020   No energy 02/28/2021   Lack of motivation 02/28/2021   Bipolar 1 disorder, mixed 03/21/2021   Hamstring strain, left, initial encounter 04/28/2021   Syncope 05/31/2021   Acute gastritis without hemorrhage 05/31/2021   Chronic bilateral low back pain without sciatica 07/25/2021   Fall 07/26/2021   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 07/27/2021   Chronic pain syndrome 01/10/2022   Abnormality of globulin 01/10/2022   Ingrown right greater toenail 10/17/2022   Dermatitis 10/17/2022   Resolved Ambulatory Problems    Diagnosis Date Noted   URI 09/04/2009   Acute bronchitis 05/03/2016   Essential hypertension 07/19/2017   DDD (degenerative disc disease), cervical 08/21/2018   Past Medical History:  Diagnosis Date   Allergy    HSV-2 (herpes simplex virus 2) infection    Seizures    Thyroid disease      ROS  See HPI.     Objective:    BP 136/67   Pulse 67   Ht 5' (1.524 m)   Wt 116 lb (52.6 kg)   SpO2 99%   BMI 22.65 kg/m  BP Readings from Last 3 Encounters:  10/17/22 136/67  08/29/22 (!) 163/74  04/21/22 (!) 119/57   Wt  Readings from Last 3 Encounters:  10/17/22 116 lb (52.6 kg)  08/29/22 117 lb (53.1 kg)  04/21/22 110 lb (49.9 kg)      Physical Exam Right great toenail ingrown at the medial base with erythema and tenderness to palpation  Left anterior leg scattered papular rash with no erythema, swelling, warmth      Assessment & Plan:  Marland KitchenMarland KitchenTresa was seen today for follow-up.  Diagnoses and all orders for this visit:  Ingrown right greater toenail  Dermatitis -     triamcinolone cream (KENALOG) 0.1 %; Apply 1 Application topically 2 (two) times daily. As  needed for rash.   Ingrown toenail not infected Epson salt soaks and let toenail grow File toenail and skin out Wear loose fitting shoes Consider removal if continues HO given  Dermatitis like rash Topical steroid given to use as needed up to twice a day Follow up as needed   Tandy Gaw, PA-C

## 2022-10-17 NOTE — Patient Instructions (Signed)
Ingrown Toenail  An ingrown toenail occurs when the corner or sides of a toenail grow into the surrounding skin. This causes discomfort and pain. The big toe is most commonly affected, but any of the toes can be affected. If an ingrown toenail is not treated, it can become infected. What are the causes? This condition may be caused by: Wearing shoes that are too small or tight. An injury, such as stubbing your toe or having your toe stepped on. Improper cutting or care of your toenails. Having nail or foot abnormalities that were present from birth (congenital abnormalities), such as having a nail that is too big for your toe. What increases the risk? The following factors may make you more likely to develop ingrown toenails: Age. Nails tend to get thicker with age, so ingrown nails are more common among older people. Cutting your toenails incorrectly, such as cutting them very short or cutting them unevenly. An ingrown toenail is more likely to get infected if you have: Diabetes. Blood flow (circulation) problems. What are the signs or symptoms? Symptoms of an ingrown toenail may include: Pain, soreness, or tenderness. Redness. Swelling. Hardening of the skin that surrounds the toenail. Signs that an ingrown toenail may be infected include: Fluid or pus. Symptoms that get worse. How is this diagnosed? Ingrown toenails may be diagnosed based on: Your symptoms and medical history. A physical exam. Labs or tests. If you have fluid or blood coming from your toenail, a sample may be collected to test for the specific type of bacteria that is causing the infection. How is this treated? Treatment depends on the severity of your symptoms. You may be able to care for your toenail at home. If you have an infection, you may be prescribed antibiotic medicines. If you have fluid or pus draining from your toenail, your health care provider may drain it. If you have trouble walking, you may be  given crutches to use. If you have a severe or infected ingrown toenail, you may need a procedure to remove part or all of the nail. Follow these instructions at home: Foot care  Check your wound every day for signs of infection, or as often as told by your health care provider. Check for: More redness, swelling, or pain. More fluid or blood. Warmth. Pus or a bad smell. Do not pick at your toenail or try to remove it yourself. Soak your foot in warm, soapy water. Do this for 20 minutes, 3 times a day, or as often as told by your health care provider. This helps to keep your toe clean and your skin soft. Wear shoes that fit well and are not too tight. Your health care provider may recommend that you wear open-toed shoes while you heal. Trim your toenails regularly and carefully. Cut your toenails straight across to prevent injury to the skin at the corners of the toenail. Do not cut your nails in a curved shape. Keep your feet clean and dry to help prevent infection. General instructions Take over-the-counter and prescription medicines only as told by your health care provider. If you were prescribed an antibiotic, take it as told by your health care provider. Do not stop taking the antibiotic even if you start to feel better. If your health care provider told you to use crutches to help you move around, use them as instructed. Return to your normal activities as told by your health care provider. Ask your health care provider what activities are safe for you.   Keep all follow-up visits. This is important. Contact a health care provider if: You have more redness, swelling, pain, or other symptoms that do not improve with treatment. You have fluid, blood, or pus coming from your toenail. You have a red streak on your skin that starts at your foot and spreads up your leg. You have a fever. Summary An ingrown toenail occurs when the corner or sides of a toenail grow into the surrounding skin.  This causes discomfort and pain. The big toe is most commonly affected, but any of the toes can be affected. If an ingrown toenail is not treated, it can become infected. Fluid or pus draining from your toenail is a sign of infection. Your health care provider may need to drain it. You may be given antibiotics to treat the infection. Trimming your toenails regularly and properly can help you prevent an ingrown toenail. This information is not intended to replace advice given to you by your health care provider. Make sure you discuss any questions you have with your health care provider. Document Revised: 10/19/2020 Document Reviewed: 10/19/2020 Elsevier Patient Education  2023 [employer]  

## 2022-11-02 ENCOUNTER — Other Ambulatory Visit: Payer: Self-pay | Admitting: Physician Assistant

## 2022-11-21 ENCOUNTER — Encounter: Payer: Self-pay | Admitting: Physician Assistant

## 2022-11-21 ENCOUNTER — Ambulatory Visit (INDEPENDENT_AMBULATORY_CARE_PROVIDER_SITE_OTHER): Payer: Medicare Other | Admitting: Physician Assistant

## 2022-11-21 VITALS — BP 146/71 | HR 57 | Ht 60.0 in | Wt 117.0 lb

## 2022-11-21 DIAGNOSIS — L600 Ingrowing nail: Secondary | ICD-10-CM | POA: Diagnosis not present

## 2022-11-21 NOTE — Patient Instructions (Signed)
Ingrown Toenail  An ingrown toenail occurs when the corner or sides of a toenail grow into the surrounding skin. This causes discomfort and pain. The big toe is most commonly affected, but any of the toes can be affected. If an ingrown toenail is not treated, it can become infected. What are the causes? This condition may be caused by: Wearing shoes that are too small or tight. An injury, such as stubbing your toe or having your toe stepped on. Improper cutting or care of your toenails. Having nail or foot abnormalities that were present from birth (congenital abnormalities), such as having a nail that is too big for your toe. What increases the risk? The following factors may make you more likely to develop ingrown toenails: Age. Nails tend to get thicker with age, so ingrown nails are more common among older people. Cutting your toenails incorrectly, such as cutting them very short or cutting them unevenly. An ingrown toenail is more likely to get infected if you have: Diabetes. Blood flow (circulation) problems. What are the signs or symptoms? Symptoms of an ingrown toenail may include: Pain, soreness, or tenderness. Redness. Swelling. Hardening of the skin that surrounds the toenail. Signs that an ingrown toenail may be infected include: Fluid or pus. Symptoms that get worse. How is this diagnosed? Ingrown toenails may be diagnosed based on: Your symptoms and medical history. A physical exam. Labs or tests. If you have fluid or blood coming from your toenail, a sample may be collected to test for the specific type of bacteria that is causing the infection. How is this treated? Treatment depends on the severity of your symptoms. You may be able to care for your toenail at home. If you have an infection, you may be prescribed antibiotic medicines. If you have fluid or pus draining from your toenail, your health care provider may drain it. If you have trouble walking, you may be  given crutches to use. If you have a severe or infected ingrown toenail, you may need a procedure to remove part or all of the nail. Follow these instructions at home: Foot care  Check your wound every day for signs of infection, or as often as told by your health care provider. Check for: More redness, swelling, or pain. More fluid or blood. Warmth. Pus or a bad smell. Do not pick at your toenail or try to remove it yourself. Soak your foot in warm, soapy water. Do this for 20 minutes, 3 times a day, or as often as told by your health care provider. This helps to keep your toe clean and your skin soft. Wear shoes that fit well and are not too tight. Your health care provider may recommend that you wear open-toed shoes while you heal. Trim your toenails regularly and carefully. Cut your toenails straight across to prevent injury to the skin at the corners of the toenail. Do not cut your nails in a curved shape. Keep your feet clean and dry to help prevent infection. General instructions Take over-the-counter and prescription medicines only as told by your health care provider. If you were prescribed an antibiotic, take it as told by your health care provider. Do not stop taking the antibiotic even if you start to feel better. If your health care provider told you to use crutches to help you move around, use them as instructed. Return to your normal activities as told by your health care provider. Ask your health care provider what activities are safe for you.   Keep all follow-up visits. This is important. Contact a health care provider if: You have more redness, swelling, pain, or other symptoms that do not improve with treatment. You have fluid, blood, or pus coming from your toenail. You have a red streak on your skin that starts at your foot and spreads up your leg. You have a fever. Summary An ingrown toenail occurs when the corner or sides of a toenail grow into the surrounding skin.  This causes discomfort and pain. The big toe is most commonly affected, but any of the toes can be affected. If an ingrown toenail is not treated, it can become infected. Fluid or pus draining from your toenail is a sign of infection. Your health care provider may need to drain it. You may be given antibiotics to treat the infection. Trimming your toenails regularly and properly can help you prevent an ingrown toenail. This information is not intended to replace advice given to you by your health care provider. Make sure you discuss any questions you have with your health care provider. Document Revised: 10/19/2020 Document Reviewed: 10/19/2020 Elsevier Patient Education  2023 [employer]  

## 2022-11-21 NOTE — Progress Notes (Signed)
Acute Office Visit  Subjective:     Patient ID: Judy Russell, female    DOB: [birth year], 67 y.o.   MRN: [id number]  Chief Complaint  Patient presents with   Ingrown Toenail    HPI Patient is in today for ingrown right lateral toenail. She does not want it completely removed but just clipped from skin where it will grow out. She is doing epson soaks at home.   .. 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   Left leg pain 03/18/2017   Right foot pain 03/18/2017   History of colon resection 07/09/2017   History of irregular heartbeat 07/09/2017   Sinus bradycardia by electrocardiogram 07/09/2017   Hyperlipidemia 07/11/2017   Elevated blood pressure reading 07/11/2017   Memory changes 07/19/2017   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   Perennial allergic rhinitis 08/13/2017   CFS (chronic fatigue syndrome) 11/27/2017   Multiple food allergies 03/25/2018   Environmental allergies 03/25/2018   Callus of foot 07/29/2018   Tonsillith 07/30/2018   Cervical radiculopathy 08/21/2018   Chronic constipation 10/30/2018   Food allergy 01/29/2019   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   Seborrheic keratoses 05/14/2019   Suspected COVID-19 virus infection 09/11/2019   COVID-19 virus infection 09/22/2019   History of COVID-19 10/07/2019   Mold exposure 12/24/2019   Chest tightness 12/24/2019   Allergic reaction 01/09/2020   Scalp itch 01/12/2020   Unintended weight loss 02/18/2020   Loss of taste 02/18/2020   Tapeworm 04/02/2020   Toenail fungus 09/14/2020    Corn of toe 09/14/2020   Rectocele 12/06/2020   No energy 02/28/2021   Lack of motivation 02/28/2021   Bipolar 1 disorder, mixed (H[facility]) 03/21/2021   Hamstring strain, left, initial encounter 04/28/2021   Syncope 05/31/2021   Acute gastritis without hemorrhage 05/31/2021   Chronic bilateral low back pain without sciatica 07/25/2021   Fall 07/26/2021   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      Objective:    BP (!) 146/71   Pulse (!) 57   Ht 5' (1.524 m)   Wt 117 lb (53.1 kg)   SpO2 99%   BMI 22.85 kg/m  BP Readings from Last 3 Encounters:  11/21/22 (!) 146/71  10/17/22 136/67  08/29/22 (!) 163/74   Wt Readings from Last 3 Encounters:  11/21/22 117 lb (53.1 kg)  10/17/22 116 lb (52.6 kg)  08/29/22 117 lb (53.1 kg)      Physical Exam  Lateral great toenail with evidence of the tip ingrown.  Mild erythema of lateral nail tissue  with no swelling, warmth or drainage.      Assessment & Plan:  Marland KitchenMarland KitchenTyeler was seen today for ingrown toenail.  Diagnoses and all orders for this visit:  Ingrown right greater toenail   Used a nail lifter in the lateral corner of right great toe and clipped the nail away from edge.  Continue epson water soaks and see if nail grows out better No signs of infection today Wear loose fitting shoes to avoid rubbing.  Follow up as needed or if symptoms worsen or persist  Tandy Gaw, PA-C

## 2022-11-30 ENCOUNTER — Encounter: Payer: Self-pay | Admitting: Physician Assistant

## 2022-12-04 ENCOUNTER — Encounter: Payer: Self-pay | Admitting: Physician Assistant

## 2022-12-04 ENCOUNTER — Ambulatory Visit (INDEPENDENT_AMBULATORY_CARE_PROVIDER_SITE_OTHER): Payer: Medicare Other | Admitting: Physician Assistant

## 2022-12-04 VITALS — BP 134/83 | HR 54 | Ht 60.0 in | Wt 113.0 lb

## 2022-12-04 DIAGNOSIS — R413 Other amnesia: Secondary | ICD-10-CM | POA: Diagnosis not present

## 2022-12-04 DIAGNOSIS — G8929 Other chronic pain: Secondary | ICD-10-CM | POA: Diagnosis not present

## 2022-12-04 DIAGNOSIS — G479 Sleep disorder, unspecified: Secondary | ICD-10-CM

## 2022-12-04 DIAGNOSIS — E039 Hypothyroidism, unspecified: Secondary | ICD-10-CM | POA: Diagnosis not present

## 2022-12-04 DIAGNOSIS — E559 Vitamin D deficiency, unspecified: Secondary | ICD-10-CM | POA: Diagnosis not present

## 2022-12-04 DIAGNOSIS — R5383 Other fatigue: Secondary | ICD-10-CM | POA: Diagnosis not present

## 2022-12-04 DIAGNOSIS — M25561 Pain in right knee: Secondary | ICD-10-CM | POA: Diagnosis not present

## 2022-12-04 LAB — CBC WITH DIFFERENTIAL/PLATELET
Absolute Monocytes: 394 cells/uL (ref 200–950)
Eosinophils Absolute: 120 cells/uL (ref 15–500)
Hemoglobin: 13.1 g/dL (ref 11.7–15.5)
MCHC: 33.6 g/dL (ref 32.0–36.0)
MCV: 88.6 fL (ref 80.0–100.0)
Neutrophils Relative %: 57.1 %
RDW: 12.4 % (ref 11.0–15.0)

## 2022-12-04 NOTE — Progress Notes (Unsigned)
   Established Patient Office Visit  Subjective   Patient ID: Judy Russell, female    DOB: Feb 18, 1956  Age: 67 y.o. MRN: [id number]  No chief complaint on file.   HPI  Trouble fatigue Going to sleep by 10 Sleeps with cat wakes up about 3 Wakes up at 6:30 7:30 asleep again and sleep until 10  2-3 months    {History (Optional):23778}  ROS    Objective:     There were no vitals taken for this visit. {Vitals History (Optional):23777}  Physical Exam   No results found for any visits on 12/04/22.  {Labs (Optional):23779}  The 10-year ASCVD risk score (Arnett DK, et al., 2019) is: 6.3%    Assessment & Plan:   Problem List Items Addressed This Visit   None   No follow-ups on file.    Tandy Gaw, PA-C

## 2022-12-04 NOTE — Patient Instructions (Signed)
Patellofemoral Pain Syndrome  Patellofemoral pain syndrome is a condition in which the tissue (cartilage) on the underside of the kneecap (patella) softens or breaks down. This causes pain in the front of the knee. The condition is also called runner's knee or chondromalacia patella. Patellofemoral pain syndrome is most common in young adults who are active in sports. The knee is the largest joint in the body. The patella covers the front of the knee and is attached to muscles above and below the knee. The underside of the patella is covered with a smooth type of cartilage (synovium). The smooth surface helps the patella glide easily when you move your knee. Patellofemoral pain syndrome causes swelling in the joint linings and bone surfaces in the knee. What are the causes? This condition may be caused by: Overuse of the knee. Poor alignment of your knee joints. Weak leg muscles. A direct hit to your kneecap. What increases the risk? You are more likely to develop this condition if: You do a lot of activities that can wear down your kneecap. These include: Running. Squatting. Climbing stairs. You start a new physical activity or exercise program. You wear shoes that do not fit well. You do not have good leg strength. You are overweight. What are the signs or symptoms? The main symptom of this condition is knee pain. This may feel like a dull, aching pain underneath your patella, in the front of your knee. There may be a popping or cracking sound when you move your knee. Pain may get worse with: Exercise. Climbing stairs. Running. Jumping. Squatting. Kneeling. Sitting for a long time. Moving or pushing on your patella. How is this diagnosed? This condition may be diagnosed based on: Your symptoms and medical history. You may be asked about your recent physical activities and which ones cause knee pain. A physical exam. This may include: Moving your patella back and forth. Checking  your range of knee motion. Having you squat or jump to see if you have pain. Checking the strength of your leg muscles. Imaging tests to confirm the diagnosis. These may include an MRI of your knee. How is this treated? This condition may be treated at home with rest, ice, compression, and elevation (RICE).  Other treatments may include: NSAIDs, such as ibuprofen. Physical therapy to stretch and strengthen your leg muscles. Shoe inserts (orthotics) to take stress off your knee. A knee brace or knee support. Adhesive tapes to the skin. Surgery to remove damaged cartilage or move the patella to a better position. This is rare. Follow these instructions at home: If you have a brace: Wear the brace as told by your health care provider. Remove it only as told by your health care provider. Loosen the brace if your toes tingle, become numb, or turn cold and blue. Keep the brace clean. If the brace is not waterproof: Do not let it get wet. Cover it with a watertight covering when you take a bath or a shower. Managing pain, stiffness, and swelling  If directed, put ice on the painful area. To do this: If you have a removable brace, remove it as told by your health care provider. Put ice in a plastic bag. Place a towel between your skin and the bag. Leave the ice on for 20 minutes, 2-3 times a day. Remove the ice if your skin turns bright red. This is very important. If you cannot feel pain, heat, or cold, you have a greater risk of damage to the area.   Move your toes often to reduce stiffness and swelling. Raise (elevate) the injured area above the level of your heart while you are sitting or lying down. Activity Rest your knee. Avoid activities that cause knee pain. Perform stretching and strengthening exercises as told by your health care provider or physical therapist. Return to your normal activities as told by your health care provider. Ask your health care provider what activities are  safe for you. General instructions Take over-the-counter and prescription medicines only as told by your health care provider. Use splints, braces, knee supports, or walking aids as directed by your health care provider. Do not use any products that contain nicotine or tobacco, such as cigarettes, e-cigarettes, and chewing tobacco. These can delay healing. If you need help quitting, ask your health care provider. Keep all follow-up visits. This is important. Contact a health care provider if: Your symptoms get worse. You are not improving with home care. Summary Patellofemoral pain syndrome is a condition in which the tissue (cartilage) on the underside of the kneecap (patella) softens or breaks down. This condition causes swelling in the joint linings and bone surfaces in the knee. This leads to pain in the front of the knee. This condition may be treated at home with rest, ice, compression, and elevation (RICE). Use splints, braces, knee supports, or walking aids as directed by your health care provider. This information is not intended to replace advice given to you by your health care provider. Make sure you discuss any questions you have with your health care provider. Document Revised: 12/03/2019 Document Reviewed: 12/03/2019 Elsevier Patient Education  2024 [employer] Inc.  

## 2022-12-05 ENCOUNTER — Encounter: Payer: Self-pay | Admitting: Physician Assistant

## 2022-12-05 ENCOUNTER — Other Ambulatory Visit: Payer: Self-pay | Admitting: Physician Assistant

## 2022-12-05 DIAGNOSIS — G479 Sleep disorder, unspecified: Secondary | ICD-10-CM | POA: Insufficient documentation

## 2022-12-05 LAB — COMPLETE METABOLIC PANEL WITH GFR
AG Ratio: 2.4 (calc) (ref 1.0–2.5)
ALT: 19 U/L (ref 6–29)
AST: 26 U/L (ref 10–35)
Albumin: 4.3 g/dL (ref 3.6–5.1)
Alkaline phosphatase (APISO): 69 U/L (ref 37–153)
BUN/Creatinine Ratio: 18 (calc) (ref 6–22)
BUN: 22 mg/dL (ref 7–25)
CO2: 29 mmol/L (ref 20–32)
Calcium: 9.5 mg/dL (ref 8.6–10.4)
Chloride: 108 mmol/L (ref 98–110)
Creat: 1.24 mg/dL — ABNORMAL HIGH (ref 0.50–1.05)
Globulin: 1.8 g/dL (calc) — ABNORMAL LOW (ref 1.9–3.7)
Glucose, Bld: 95 mg/dL (ref 65–99)
Potassium: 4.5 mmol/L (ref 3.5–5.3)
Sodium: 145 mmol/L (ref 135–146)
Total Bilirubin: 0.3 mg/dL (ref 0.2–1.2)
Total Protein: 6.1 g/dL (ref 6.1–8.1)
eGFR: 49 mL/min/{1.73_m2} — ABNORMAL LOW (ref >=60)

## 2022-12-05 LAB — CBC WITH DIFFERENTIAL/PLATELET
Basophils Absolute: 29 cells/uL (ref 0–200)
Basophils Relative: 0.6 %
Eosinophils Relative: 2.5 %
HCT: 39 % (ref 35.0–45.0)
Lymphs Abs: 1517 cells/uL (ref 850–3900)
MCH: 29.8 pg (ref 27.0–33.0)
MPV: 9.4 fL (ref 7.5–12.5)
Monocytes Relative: 8.2 %
Neutro Abs: 2741 cells/uL (ref 1500–7800)
Platelets: 273 10*3/uL (ref 140–400)
RBC: 4.4 10*6/uL (ref 3.80–5.10)
Total Lymphocyte: 31.6 %
WBC: 4.8 10*3/uL (ref 3.8–10.8)

## 2022-12-05 LAB — B12 AND FOLATE PANEL
Folate: 9.1 ng/mL
Vitamin B-12: 346 pg/mL (ref 200–1100)

## 2022-12-05 LAB — TSH: TSH: 0.05 mIU/L — ABNORMAL LOW (ref 0.40–4.50)

## 2022-12-05 LAB — VITAMIN D 25 HYDROXY (VIT D DEFICIENCY, FRACTURES): Vit D, 25-Hydroxy: 25 ng/mL — ABNORMAL LOW (ref 30–100)

## 2022-12-05 MED ORDER — LEVOTHYROXINE SODIUM 112 MCG PO TABS: 112.0000 ug | ORAL_TABLET | Freq: Every day | ORAL | 0 refills | Status: DC

## 2022-12-05 MED ORDER — LEVOTHYROXINE SODIUM 112 MCG PO TABS: 112.0000 ug | ORAL_TABLET | Freq: Every day | ORAL | 1 refills | Status: DC

## 2022-12-05 NOTE — Progress Notes (Signed)
Vi,   B12 is in the low normal range. You need to take B12 daily.  Vitamin D is low. How much are you taking of D3 daily?  TSH is low meaning you are more HYPER thyroid right now. We need to decrease supplementation to and recheck labs in 6 weeks.  Your kidney function dropped a lot. Kidneys look dry. Drink more water and lets recheck in 2 weeks your kidney function. Avoid any anti-inflammatories.  All together reason enough for you to feel "off".

## 2022-12-18 ENCOUNTER — Ambulatory Visit (INDEPENDENT_AMBULATORY_CARE_PROVIDER_SITE_OTHER): Payer: Medicare Other | Admitting: Physician Assistant

## 2022-12-18 ENCOUNTER — Encounter: Payer: Self-pay | Admitting: Physician Assistant

## 2022-12-18 VITALS — BP 137/66 | HR 55 | Ht 60.0 in | Wt 116.0 lb

## 2022-12-18 DIAGNOSIS — R944 Abnormal results of kidney function studies: Secondary | ICD-10-CM

## 2022-12-18 DIAGNOSIS — R5383 Other fatigue: Secondary | ICD-10-CM

## 2022-12-18 DIAGNOSIS — G478 Other sleep disorders: Secondary | ICD-10-CM | POA: Diagnosis not present

## 2022-12-18 DIAGNOSIS — G4710 Hypersomnia, unspecified: Secondary | ICD-10-CM

## 2022-12-19 ENCOUNTER — Encounter: Payer: Self-pay | Admitting: Physician Assistant

## 2022-12-19 DIAGNOSIS — G4710 Hypersomnia, unspecified: Secondary | ICD-10-CM | POA: Insufficient documentation

## 2022-12-19 LAB — BASIC METABOLIC PANEL WITH GFR
BUN: 20 mg/dL (ref 7–25)
CO2: 30 mmol/L (ref 20–32)
Calcium: 9.5 mg/dL (ref 8.6–10.4)
Chloride: 102 mmol/L (ref 98–110)
Creat: 0.93 mg/dL (ref 0.50–1.05)
Glucose, Bld: 90 mg/dL (ref 65–99)
Potassium: 4.3 mmol/L (ref 3.5–5.3)
Sodium: 140 mmol/L (ref 135–146)
eGFR: 69 mL/min/{1.73_m2} (ref >=60)

## 2022-12-19 NOTE — Progress Notes (Signed)
Kidney function is MUCH better!

## 2022-12-19 NOTE — Progress Notes (Signed)
Established Patient Office Visit  Subjective   Patient ID: Judy Russell, female    DOB: 12/03/1955  Age: 67 y.o. MRN: [id number]  Chief Complaint  Patient presents with   Follow-up    Kidney function    HPI Pt is a 67 yo female who presents to the clinic to follow up on kidney function. Her GFR decreased on last check. Pt has increased hydration and not taking NSAIds. She continues to have no energy. She thought the vitamins were helping but then she went back to feeling so fatigued. She wakes up needing another nap. Denies depression or feelings of hopelessness or helplessness.   .. 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   Left leg pain 03/18/2017   Right foot pain 03/18/2017   History of colon resection 07/09/2017   History of irregular heartbeat 07/09/2017   Sinus bradycardia by electrocardiogram 07/09/2017   Hyperlipidemia 07/11/2017   Elevated blood pressure reading 07/11/2017   Memory changes 07/19/2017   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   Perennial allergic rhinitis 08/13/2017   CFS (chronic fatigue syndrome) 11/27/2017   Multiple food allergies 03/25/2018   Environmental allergies 03/25/2018   Callus of foot 07/29/2018   Tonsillith 07/30/2018   Cervical radiculopathy 08/21/2018   Chronic constipation 10/30/2018   Food allergy 01/29/2019   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   Seborrheic keratoses 05/14/2019   Suspected COVID-19 virus infection 09/11/2019   COVID-19 virus infection 09/22/2019   History of COVID-19 10/07/2019   Mold  exposure 12/24/2019   Chest tightness 12/24/2019   Allergic reaction 01/09/2020   Scalp itch 01/12/2020   Unintended weight loss 02/18/2020   Loss of taste 02/18/2020   Tapeworm 04/02/2020   Toenail fungus 09/14/2020   Corn of toe 09/14/2020   Rectocele 12/06/2020   No energy 02/28/2021   Lack of motivation 02/28/2021   Bipolar 1 disorder, mixed (HCC) 03/21/2021   Hamstring strain, left, initial encounter 04/28/2021   Syncope 05/31/2021   Acute gastritis without hemorrhage 05/31/2021   Chronic bilateral low back pain without sciatica 07/25/2021   Fall 07/26/2021   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    Objective:     BP 137/66 (BP Location: Right Arm, Patient Position: Sitting, Cuff Size: Normal)   Pulse (!) 55   Ht 5' (1.524 m)   Wt 116 lb (52.6 kg)   SpO2 96%   BMI 22.65 kg/m     Physical Exam Constitutional:  Appearance: Normal appearance.  HENT:     Head: Normocephalic.  Cardiovascular:     Rate and Rhythm: Normal rate and regular rhythm.     Pulses: Normal pulses.     Heart sounds: Normal heart sounds.  Pulmonary:     Effort: Pulmonary effort is normal.     Breath sounds: Normal breath sounds.  Musculoskeletal:     Right lower leg: No edema.     Left lower leg: No edema.  Neurological:     General: No focal deficit present.     Mental Status: She is  alert and oriented to person, place, and time.  Psychiatric:        Mood and Affect: Mood normal.        The 10-year ASCVD risk score (Arnett DK, et al., 2019) is: 5.6%    Assessment & Plan:  Marland KitchenMarland KitchenTaquasha was seen today for follow-up.  Diagnoses and all orders for this visit:  Decreased GFR -     BASIC METABOLIC PANEL WITH GFR  Non-restorative sleep -     Home sleep test  Hypersomnolence -     Home sleep test   Recheck kidney function Ordered sleep test.    Tandy Gaw, PA-C

## 2022-12-21 ENCOUNTER — Other Ambulatory Visit: Payer: Self-pay | Admitting: Physician Assistant

## 2022-12-21 DIAGNOSIS — M47819 Spondylosis without myelopathy or radiculopathy, site unspecified: Secondary | ICD-10-CM

## 2022-12-21 DIAGNOSIS — G894 Chronic pain syndrome: Secondary | ICD-10-CM

## 2022-12-27 DIAGNOSIS — M9902 Segmental and somatic dysfunction of thoracic region: Secondary | ICD-10-CM | POA: Diagnosis not present

## 2022-12-27 DIAGNOSIS — M9903 Segmental and somatic dysfunction of lumbar region: Secondary | ICD-10-CM | POA: Diagnosis not present

## 2022-12-27 DIAGNOSIS — M9901 Segmental and somatic dysfunction of cervical region: Secondary | ICD-10-CM | POA: Diagnosis not present

## 2022-12-27 DIAGNOSIS — M9904 Segmental and somatic dysfunction of sacral region: Secondary | ICD-10-CM | POA: Diagnosis not present

## 2022-12-27 DIAGNOSIS — M542 Cervicalgia: Secondary | ICD-10-CM | POA: Diagnosis not present

## 2022-12-27 DIAGNOSIS — M545 Low back pain, unspecified: Secondary | ICD-10-CM | POA: Diagnosis not present

## 2022-12-29 ENCOUNTER — Ambulatory Visit (INDEPENDENT_AMBULATORY_CARE_PROVIDER_SITE_OTHER): Payer: Medicare Other | Admitting: Physician Assistant

## 2022-12-29 ENCOUNTER — Ambulatory Visit (INDEPENDENT_AMBULATORY_CARE_PROVIDER_SITE_OTHER): Payer: Medicare Other

## 2022-12-29 ENCOUNTER — Encounter: Payer: Self-pay | Admitting: Physician Assistant

## 2022-12-29 VITALS — BP 147/77 | HR 49 | Ht 60.0 in | Wt 117.0 lb

## 2022-12-29 DIAGNOSIS — W19XXXA Unspecified fall, initial encounter: Secondary | ICD-10-CM

## 2022-12-29 DIAGNOSIS — M545 Low back pain, unspecified: Secondary | ICD-10-CM | POA: Diagnosis not present

## 2022-12-29 DIAGNOSIS — G8929 Other chronic pain: Secondary | ICD-10-CM

## 2022-12-29 MED ORDER — PREDNISONE 20 MG PO TABS
20.0000 mg | ORAL_TABLET | Freq: Two times a day (BID) | ORAL | 0 refills | Status: DC
Start: 2022-12-29 — End: 2023-01-15

## 2022-12-29 NOTE — Progress Notes (Signed)
Acute Office Visit  Subjective:     Patient ID: Judy Russell, female    DOB: 1955/11/24, 67 y.o.   MRN: [id number]  Chief Complaint  Patient presents with   Fall    HPI Patient is in today for fall that occurred 7 days ago while making a fruit salad. She had dropped a banana peel and then slipped on it. She hit the back of her head on granite counter top and landed on her left lower back. She was sore and has progressively gotten worse. No bowel or bladder dysfunction. No saddle anesthesia. No leg weakness. No radiation of pain past buttocks. Pt does have known lumbar DDD with chronic low back pain associated. She has been using tens unit, lumbar support brace, ice/heat and tramadol. She went to chiropractor for adjustment and did not help at all. She wants xray today.   .. 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   Left leg pain 03/18/2017   Right foot pain 03/18/2017   History of colon resection 07/09/2017   History of irregular heartbeat 07/09/2017   Sinus bradycardia by electrocardiogram 07/09/2017   Hyperlipidemia 07/11/2017   Elevated blood pressure reading 07/11/2017   Memory changes 07/19/2017   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   Perennial allergic rhinitis 08/13/2017   CFS (chronic fatigue syndrome) 11/27/2017   Multiple food allergies 03/25/2018   Environmental allergies 03/25/2018   Callus of foot 07/29/2018   Tonsillith 07/30/2018   Cervical radiculopathy 08/21/2018   Chronic constipation 10/30/2018   Food allergy 01/29/2019    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   Seborrheic keratoses 05/14/2019   Suspected COVID-19 virus infection 09/11/2019   COVID-19 virus infection 09/22/2019   History of COVID-19 10/07/2019   Mold exposure 12/24/2019   Chest tightness 12/24/2019   Allergic reaction 01/09/2020   Scalp itch 01/12/2020   Unintended weight loss 02/18/2020   Loss of taste 02/18/2020   Tapeworm 04/02/2020   Toenail fungus 09/14/2020   Corn of toe 09/14/2020   Rectocele 12/06/2020   No energy 02/28/2021   Lack of motivation 02/28/2021   Bipolar 1 disorder, mixed (HCC) 03/21/2021   Hamstring strain, left, initial encounter 04/28/2021   Syncope 05/31/2021   Acute gastritis without hemorrhage 05/31/2021   Chronic bilateral low back pain without sciatica 07/25/2021   Fall 07/26/2021   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 07/27/2021   Chronic pain syndrome 01/10/2022   Abnormality of globulin 01/10/2022   Ingrown right greater toenail 10/17/2022   Dermatitis 10/17/2022   Trouble in sleeping 12/05/2022   Decreased GFR 12/18/2022   Non-restorative sleep 12/18/2022   Hypersomnolence 12/19/2022   Resolved Ambulatory Problems    Diagnosis Date Noted   URI 09/04/2009   Acute bronchitis 05/03/2016   Essential hypertension 07/19/2017   DDD (degenerative disc disease), cervical 08/21/2018   Past Medical History:  Diagnosis Date   Allergy    HSV-2 (herpes simplex virus 2) infection    Seizures (HCC)    Thyroid disease      ROS See HPI.      Objective:    BP [name])  147/77   Pulse (!) 49   Ht 5' (1.524 m)   Wt 117 lb (53.1 kg)   SpO2 97%   BMI 22.85 kg/m  BP Readings from Last 3 Encounters:  12/29/22 (!) 147/77  12/18/22 137/66  12/04/22 134/83   Wt Readings from Last 3 Encounters:  12/29/22 117 lb (53.1 kg)  12/18/22 116 lb (52.6 kg)  12/04/22 113 lb (51.3 kg)      Physical Exam Constitutional:      Appearance: Normal appearance.  HENT:     Head: Normocephalic.  Cardiovascular:     Rate  and Rhythm: Normal rate.  Pulmonary:     Effort: Pulmonary effort is normal.  Musculoskeletal:     Right lower leg: No edema.     Left lower leg: No edema.     Comments: Negative SLR, bilaterally.  5/5 strength bilateral lower extremity.  Decreased ROM at waist due to pain  No tenderness over lumbar spine but tenderness associated with palpation of left paraspinal muscles   Neurological:     General: No focal deficit present.     Mental Status: She is alert and oriented to person, place, and time.  Psychiatric:        Mood and Affect: Mood normal.           Assessment & Plan:  Marland KitchenMarland KitchenPryncess was seen today for fall.  Diagnoses and all orders for this visit:  Acute left-sided low back pain without sciatica -     predniSONE (DELTASONE) 20 MG tablet; Take 1 tablet (20 mg total) by mouth 2 (two) times daily with a meal. For 5 days. -     DG Lumbar Spine Complete; Future  Fall, initial encounter -     predniSONE (DELTASONE) 20 MG tablet; Take 1 tablet (20 mg total) by mouth 2 (two) times daily with a meal. For 5 days. -     DG Lumbar Spine Complete; Future  Chronic bilateral low back pain without sciatica  No red flags for back pain today Due to fall will get lumbar xray Discussed symptomatic care Declined muscle relaxer Gave exercises to do  Continue tens unit and ice and heat Tramadol as needed for break through pain Start prednisone burst for 5 days Follow up as needed or if symptoms persist or worsen.    Tandy Gaw, PA-C

## 2022-12-29 NOTE — Patient Instructions (Signed)
Low Back Sprain or Strain Rehab Ask your health care provider which exercises are safe for you. Do exercises exactly as told by your health care provider and adjust them as directed. It is normal to feel mild stretching, pulling, tightness, or discomfort as you do these exercises. Stop right away if you feel sudden pain or your pain gets worse. Do not begin these exercises until told by your health care provider. Stretching and range-of-motion exercises These exercises warm up your muscles and joints and improve the movement and flexibility of your back. These exercises also help to relieve pain, numbness, and tingling. Lumbar rotation  Lie on your back on a firm bed or the floor with your knees bent. Straighten your arms out to your sides so each arm forms a 90-degree angle (right angle) with a side of your body. Slowly move (rotate) both of your knees to one side of your body until you feel a stretch in your lower back (lumbar). Try not to let your shoulders lift off the floor. Hold this position for __________ seconds. Tense your abdominal muscles and slowly move your knees back to the starting position. Repeat this exercise on the other side of your body. Repeat __________ times. Complete this exercise __________ times a day. Single knee to chest  Lie on your back on a firm bed or the floor with both legs straight. Bend one of your knees. Use your hands to move your knee up toward your chest until you feel a gentle stretch in your lower back and buttock. Hold your leg in this position by holding on to the front of your knee. Keep your other leg as straight as possible. Hold this position for __________ seconds. Slowly return to the starting position. Repeat with your other leg. Repeat __________ times. Complete this exercise __________ times a day. Prone extension on elbows  Lie on your abdomen on a firm bed or the floor (prone position). Prop yourself up on your elbows. Use your arms  to help lift your chest up until you feel a gentle stretch in your abdomen and your lower back. This will place some of your body weight on your elbows. If this is uncomfortable, try stacking pillows under your chest. Your hips should stay down, against the surface that you are lying on. Keep your hip and back muscles relaxed. Hold this position for __________ seconds. Slowly relax your upper body and return to the starting position. Repeat __________ times. Complete this exercise __________ times a day. Strengthening exercises These exercises build strength and endurance in your back. Endurance is the ability to use your muscles for a long time, even after they get tired. Pelvic tilt This exercise strengthens the muscles that lie deep in the abdomen. Lie on your back on a firm bed or the floor with your legs extended. Bend your knees so they are pointing toward the ceiling and your feet are flat on the floor. Tighten your lower abdominal muscles to press your lower back against the floor. This motion will tilt your pelvis so your tailbone points up toward the ceiling instead of pointing to your feet or the floor. To help with this exercise, you may place a small towel under your lower back and try to push your back into the towel. Hold this position for __________ seconds. Let your muscles relax completely before you repeat this exercise. Repeat __________ times. Complete this exercise __________ times a day. Alternating arm and leg raises  Get on your hands   and knees on a firm surface. If you are on a hard floor, you may want to use padding, such as an exercise mat, to cushion your knees. Line up your arms and legs. Your hands should be directly below your shoulders, and your knees should be directly below your hips. Lift your left leg behind you. At the same time, raise your right arm and straighten it in front of you. Do not lift your leg higher than your hip. Do not lift your arm higher  than your shoulder. Keep your abdominal and back muscles tight. Keep your hips facing the ground. Do not arch your back. Keep your balance carefully, and do not hold your breath. Hold this position for __________ seconds. Slowly return to the starting position. Repeat with your right leg and your left arm. Repeat __________ times. Complete this exercise __________ times a day. Abdominal set with straight leg raise  Lie on your back on a firm bed or the floor. Bend one of your knees and keep your other leg straight. Tense your abdominal muscles and lift your straight leg up, 4-6 inches (10-15 cm) off the ground. Keep your abdominal muscles tight and hold this position for __________ seconds. Do not hold your breath. Do not arch your back. Keep it flat against the ground. Keep your abdominal muscles tense as you slowly lower your leg back to the starting position. Repeat with your other leg. Repeat __________ times. Complete this exercise __________ times a day. Single leg lower with bent knees Lie on your back on a firm bed or the floor. Tense your abdominal muscles and lift your feet off the floor, one foot at a time, so your knees and hips are bent in 90-degree angles (right angles). Your knees should be over your hips and your lower legs should be parallel to the floor. Keeping your abdominal muscles tense and your knee bent, slowly lower one of your legs so your toe touches the ground. Lift your leg back up to return to the starting position. Do not hold your breath. Do not let your back arch. Keep your back flat against the ground. Repeat with your other leg. Repeat __________ times. Complete this exercise __________ times a day. Posture and body mechanics Good posture and healthy body mechanics can help to relieve stress in your body's tissues and joints. Body mechanics refers to the movements and positions of your body while you do your daily activities. Posture is part of body  mechanics. Good posture means: Your spine is in its natural S-curve position (neutral). Your shoulders are pulled back slightly. Your head is not tipped forward (neutral). Follow these guidelines to improve your posture and body mechanics in your everyday activities. Standing  When standing, keep your spine neutral and your feet about hip-width apart. Keep a slight bend in your knees. Your ears, shoulders, and hips should line up. When you do a task in which you stand in one place for a long time, place one foot up on a stable object that is 2-4 inches (5-10 cm) high, such as a footstool. This helps keep your spine neutral. Sitting  When sitting, keep your spine neutral and keep your feet flat on the floor. Use a footrest, if necessary, and keep your thighs parallel to the floor. Avoid rounding your shoulders, and avoid tilting your head forward. When working at a desk or a computer, keep your desk at a height where your hands are slightly lower than your elbows. Slide your   chair under your desk so you are close enough to maintain good posture. When working at a computer, place your monitor at a height where you are looking straight ahead and you do not have to tilt your head forward or downward to look at the screen. Resting When lying down and resting, avoid positions that are most painful for you. If you have pain with activities such as sitting, bending, stooping, or squatting, lie in a position in which your body does not bend very much. For example, avoid curling up on your side with your arms and knees near your chest (fetal position). If you have pain with activities such as standing for a long time or reaching with your arms, lie with your spine in a neutral position and bend your knees slightly. Try the following positions: Lying on your side with a pillow between your knees. Lying on your back with a pillow under your knees. Lifting  When lifting objects, keep your feet at least  shoulder-width apart and tighten your abdominal muscles. Bend your knees and hips and keep your spine neutral. It is important to lift using the strength of your legs, not your back. Do not lock your knees straight out. Always ask for help to lift heavy or awkward objects. This information is not intended to replace advice given to you by your health care provider. Make sure you discuss any questions you have with your health care provider. Document Revised: 09/06/2020 Document Reviewed: 09/06/2020 Elsevier Patient Education  2024 [employer]  

## 2022-12-29 NOTE — Progress Notes (Signed)
No acute fracture. Lots of arthritis mostly at L2/L3.

## 2023-01-02 ENCOUNTER — Telehealth: Payer: Self-pay | Admitting: Physician Assistant

## 2023-01-02 DIAGNOSIS — G4710 Hypersomnia, unspecified: Secondary | ICD-10-CM

## 2023-01-02 DIAGNOSIS — G478 Other sleep disorders: Secondary | ICD-10-CM

## 2023-01-02 NOTE — Telephone Encounter (Signed)
Patient requesting an update on home sleep test she was seen on 12-18-22 Please advise

## 2023-01-03 DIAGNOSIS — M9902 Segmental and somatic dysfunction of thoracic region: Secondary | ICD-10-CM | POA: Diagnosis not present

## 2023-01-03 DIAGNOSIS — M9904 Segmental and somatic dysfunction of sacral region: Secondary | ICD-10-CM | POA: Diagnosis not present

## 2023-01-03 DIAGNOSIS — M9901 Segmental and somatic dysfunction of cervical region: Secondary | ICD-10-CM | POA: Diagnosis not present

## 2023-01-03 DIAGNOSIS — M545 Low back pain, unspecified: Secondary | ICD-10-CM | POA: Diagnosis not present

## 2023-01-03 DIAGNOSIS — M542 Cervicalgia: Secondary | ICD-10-CM | POA: Diagnosis not present

## 2023-01-03 DIAGNOSIS — M9903 Segmental and somatic dysfunction of lumbar region: Secondary | ICD-10-CM | POA: Diagnosis not present

## 2023-01-05 NOTE — Telephone Encounter (Signed)
Jade, can you re-enter sleep study orders and mark as future. Per sleep lab, orders were entered as same day. Normally takes sleep lab a few weeks to contact patient to schedule an appointment.

## 2023-01-09 ENCOUNTER — Encounter: Payer: Self-pay | Admitting: Physician Assistant

## 2023-01-09 DIAGNOSIS — M9901 Segmental and somatic dysfunction of cervical region: Secondary | ICD-10-CM | POA: Diagnosis not present

## 2023-01-09 DIAGNOSIS — M9902 Segmental and somatic dysfunction of thoracic region: Secondary | ICD-10-CM | POA: Diagnosis not present

## 2023-01-09 DIAGNOSIS — M542 Cervicalgia: Secondary | ICD-10-CM | POA: Diagnosis not present

## 2023-01-09 DIAGNOSIS — M9903 Segmental and somatic dysfunction of lumbar region: Secondary | ICD-10-CM | POA: Diagnosis not present

## 2023-01-09 DIAGNOSIS — M9904 Segmental and somatic dysfunction of sacral region: Secondary | ICD-10-CM | POA: Diagnosis not present

## 2023-01-09 DIAGNOSIS — M545 Low back pain, unspecified: Secondary | ICD-10-CM | POA: Diagnosis not present

## 2023-01-09 NOTE — Addendum Note (Signed)
Addended by: Jomarie Longs on: 01/09/2023 12:14 PM   Modules accepted: Orders

## 2023-01-15 ENCOUNTER — Encounter: Payer: Self-pay | Admitting: Physician Assistant

## 2023-01-15 ENCOUNTER — Ambulatory Visit (INDEPENDENT_AMBULATORY_CARE_PROVIDER_SITE_OTHER): Payer: Medicare Other | Admitting: Physician Assistant

## 2023-01-15 VITALS — BP 147/78 | HR 56 | Resp 16 | Ht 60.0 in | Wt 119.0 lb

## 2023-01-15 DIAGNOSIS — G4710 Hypersomnia, unspecified: Secondary | ICD-10-CM

## 2023-01-15 DIAGNOSIS — E039 Hypothyroidism, unspecified: Secondary | ICD-10-CM | POA: Diagnosis not present

## 2023-01-15 DIAGNOSIS — R5383 Other fatigue: Secondary | ICD-10-CM | POA: Diagnosis not present

## 2023-01-15 DIAGNOSIS — R300 Dysuria: Secondary | ICD-10-CM

## 2023-01-15 DIAGNOSIS — G8929 Other chronic pain: Secondary | ICD-10-CM | POA: Diagnosis not present

## 2023-01-15 DIAGNOSIS — R030 Elevated blood-pressure reading, without diagnosis of hypertension: Secondary | ICD-10-CM

## 2023-01-15 DIAGNOSIS — M545 Low back pain, unspecified: Secondary | ICD-10-CM | POA: Diagnosis not present

## 2023-01-15 NOTE — Progress Notes (Unsigned)
   Established Patient Office Visit  Subjective   Patient ID: Araminta Zorn, female    DOB: 1955/10/30  Age: 67 y.o. MRN: [id number]  Chief Complaint  Patient presents with   Thyroid  recheck    HPI Tens unit prescription  Thyroid check  Taking vitamin D   ROS    Objective:     BP (!) 146/93 (BP Location: Right Arm, Patient Position: Sitting, Cuff Size: Normal)   Pulse (!) 56   Resp 16   Ht 5' (1.524 m)   Wt 119 lb (54 kg)   SpO2 99%   BMI 23.24 kg/m  BP Readings from Last 3 Encounters:  01/15/23 (!) 147/78  12/29/22 (!) 147/77  12/18/22 137/66   Wt Readings from Last 3 Encounters:  01/15/23 119 lb (54 kg)  12/29/22 117 lb (53.1 kg)  12/18/22 116 lb (52.6 kg)      Physical Exam   The 10-year ASCVD risk score (Arnett DK, et al., 2019) is: 6.3%    Assessment & Plan:    No follow-ups on file.    Tandy Gaw, PA-C

## 2023-01-15 NOTE — Patient Instructions (Signed)
Preventing Hypertension Hypertension, also called high blood pressure, is when the force of blood pumping through the arteries is too strong. Arteries are blood vessels that carry blood from the heart throughout the body. Often, hypertension does not cause symptoms until blood pressure is very high. It is important to have your blood pressure checked regularly. Diet and lifestyle changes can help you prevent hypertension, and they may make you feel better overall and improve your quality of life. If you already have hypertension, you may control it with diet and lifestyle changes, as well as with medicine. How can this condition affect me? Over time, hypertension can damage the arteries and decrease blood flow to important parts of the body, including the brain, heart, and kidneys. By keeping your blood pressure in a healthy range, you can help prevent complications like heart attack, heart failure, stroke, kidney failure, and vascular dementia. What can increase my risk? An unhealthy diet and a lack of physical activity can make you more likely to develop high blood pressure. Some other risk factors include: Age. The risk increases with age. Having family members who have had high blood pressure. Having certain health conditions, such as thyroid problems. Being overweight or obese. Drinking too much alcohol or caffeine. Having too much fat, sugar, calories, or salt (sodium) in your diet. Smoking or using illegal drugs. Taking certain medicines, such as antidepressants, decongestants, birth control pills, and NSAIDs, such as ibuprofen. What actions can I take to prevent or manage this condition? Work with your health care provider to make a hypertension prevention plan that works for you. You may be referred for counseling on a healthy diet and physical activity. Follow your plan and keep all follow-up visits. Diet changes Maintain a healthy diet. This includes: Eating less salt (sodium). Ask your  health care provider how much sodium is safe for you to have. The general recommendation is to have less than 1 tsp (2,300 mg) of sodium a day. Do not add salt to your food. Choose low-sodium options when grocery shopping and eating out. Limiting fats in your diet. You can do this by eating low-fat or fat-free dairy products and by eating less red meat. Eating more fruits, vegetables, and whole grains. Make a goal to eat: 1-2 cups of fresh fruits and vegetables each day. 3-4 servings of whole grains each day. Avoiding foods and beverages that have added sugars. Eating fish that contain healthy fats (omega-3 fatty acids), such as mackerel or salmon. If you need help putting together a healthy eating plan, try the DASH diet. This diet is high in fruits, vegetables, and whole grains. It is low in sodium, red meat, and added sugars. DASH stands for Dietary Approaches to Stop Hypertension. Lifestyle changes  Lose weight if you are overweight. Losing just 3-5% of your body weight can help prevent or control hypertension. For example, if your present weight is 200 lb (91 kg), a loss of 3-5% of your weight means losing 6-10 lb (2.7-4.5 kg). Ask your health care provider to help you with a diet and exercise plan to safely lose weight. Get enough exercise. Do at least 150 minutes of moderate-intensity exercise each week. You could do this in short exercise sessions several times a day, or you could do longer exercise sessions a few times a week. For example, you could take a brisk 10-minute walk or bike ride, 3 times a day, for 5 days a week. Find ways to reduce stress, such as exercising, meditating, listening to   music, or taking a yoga class. If you need help reducing stress, ask your health care provider. Do not use any products that contain nicotine or tobacco. These products include cigarettes, chewing tobacco, and vaping devices, such as e-cigarettes. Chemicals in tobacco and nicotine products raise your  blood pressure each time you use them. If you need help quitting, ask your health care provider. Learn how to check your blood pressure at home. Make sure that you know your personal target blood pressure, as told by your health care provider. Try to sleep 7-9 hours per night. Alcohol use Do not drink alcohol if: Your health care provider tells you not to drink. You are pregnant, may be pregnant, or are planning to become pregnant. If you drink alcohol: Limit how much you have to: 0-1 drink a day for women. 0-2 drinks a day for men. Know how much alcohol is in your drink. In the [place] one drink equals one 12 oz bottle of beer (355 mL), one 5 oz glass of wine (148 mL), or one 1 oz glass of hard liquor (44 mL). Medicines In addition to diet and lifestyle changes, your health care provider may recommend medicines to help lower your blood pressure. In general: You may need to try a few different medicines to find what works best for you. You may need to take more than one medicine. Take over-the-counter and prescription medicines only as told by your health care provider. Questions to ask your health care provider What is my blood pressure goal? How can I lower my risk for high blood pressure? How should I monitor my blood pressure at home? Where to find support Your health care provider can help you prevent hypertension and help you keep your blood pressure at a healthy level. Your local hospital or your community may also provide support services and prevention programs. The American Heart Association offers an online support network at supportnetwork.heart.org Where to find more information Learn more about hypertension from: National Heart, Lung, and Blood Institute: www.nhlbi.nih.gov Centers for Disease Control and Prevention: www.cdc.gov American Academy of Family Physicians: familydoctor.org Learn more about the DASH diet from: National Heart, Lung, and Blood Institute:  www.nhlbi.nih.gov Contact a health care provider if: You think you are having a reaction to medicines you have taken. You have recurrent headaches or feel dizzy. You have swelling in your ankles. You have trouble with your vision. Get help right away if: You have sudden, severe chest, back, or abdominal pain or discomfort. You have shortness of breath. You have a sudden, severe headache. These symptoms may be an emergency. Get help right away. Call 911. Do not wait to see if the symptoms will go away. Do not drive yourself to the hospital. Summary Hypertension often does not cause any symptoms until blood pressure is very high. It is important to get your blood pressure checked regularly. Diet and lifestyle changes are important steps in preventing hypertension. By keeping your blood pressure in a healthy range, you may prevent complications like heart attack, heart failure, stroke, and kidney failure. Work with your health care provider to make a hypertension prevention plan that works for you. This information is not intended to replace advice given to you by your health care provider. Make sure you discuss any questions you have with your health care provider. Document Revised: 04/07/2021 Document Reviewed: 04/07/2021 Elsevier Patient Education  2024 Elsevier Inc.  

## 2023-01-15 NOTE — Telephone Encounter (Signed)
Patient is scheduled for Home sleep study appointment 01/30/2023 at 10:00 am. Pt aware of appointment.

## 2023-01-16 ENCOUNTER — Encounter: Payer: Self-pay | Admitting: Physician Assistant

## 2023-01-16 DIAGNOSIS — M9904 Segmental and somatic dysfunction of sacral region: Secondary | ICD-10-CM | POA: Diagnosis not present

## 2023-01-16 DIAGNOSIS — M9901 Segmental and somatic dysfunction of cervical region: Secondary | ICD-10-CM | POA: Diagnosis not present

## 2023-01-16 DIAGNOSIS — M545 Low back pain, unspecified: Secondary | ICD-10-CM | POA: Diagnosis not present

## 2023-01-16 DIAGNOSIS — M9903 Segmental and somatic dysfunction of lumbar region: Secondary | ICD-10-CM | POA: Diagnosis not present

## 2023-01-16 DIAGNOSIS — M542 Cervicalgia: Secondary | ICD-10-CM | POA: Diagnosis not present

## 2023-01-16 DIAGNOSIS — M9902 Segmental and somatic dysfunction of thoracic region: Secondary | ICD-10-CM | POA: Diagnosis not present

## 2023-01-16 LAB — TSH: TSH: 0.32 mIU/L — ABNORMAL LOW (ref 0.40–4.50)

## 2023-01-16 NOTE — Progress Notes (Signed)
TSH not quite into optimal range for TSH. Taking the daily now correct? I would suggest [place] decreasing to daily. Thoughts ok with [place] sending to pharmacy? If so which one?

## 2023-01-18 DIAGNOSIS — M47819 Spondylosis without myelopathy or radiculopathy, site unspecified: Secondary | ICD-10-CM | POA: Diagnosis not present

## 2023-01-29 ENCOUNTER — Encounter (HOSPITAL_BASED_OUTPATIENT_CLINIC_OR_DEPARTMENT_OTHER): Payer: Medicare Other | Admitting: Internal Medicine

## 2023-01-29 ENCOUNTER — Ambulatory Visit: Payer: Medicare Other

## 2023-01-30 ENCOUNTER — Encounter (HOSPITAL_BASED_OUTPATIENT_CLINIC_OR_DEPARTMENT_OTHER): Payer: Medicare Other | Admitting: Internal Medicine

## 2023-01-30 ENCOUNTER — Other Ambulatory Visit: Payer: Self-pay | Admitting: Physician Assistant

## 2023-01-30 DIAGNOSIS — M9902 Segmental and somatic dysfunction of thoracic region: Secondary | ICD-10-CM | POA: Diagnosis not present

## 2023-01-30 DIAGNOSIS — M9904 Segmental and somatic dysfunction of sacral region: Secondary | ICD-10-CM | POA: Diagnosis not present

## 2023-01-30 DIAGNOSIS — M542 Cervicalgia: Secondary | ICD-10-CM | POA: Diagnosis not present

## 2023-01-30 DIAGNOSIS — M9903 Segmental and somatic dysfunction of lumbar region: Secondary | ICD-10-CM | POA: Diagnosis not present

## 2023-01-30 DIAGNOSIS — M9901 Segmental and somatic dysfunction of cervical region: Secondary | ICD-10-CM | POA: Diagnosis not present

## 2023-01-30 DIAGNOSIS — M545 Low back pain, unspecified: Secondary | ICD-10-CM | POA: Diagnosis not present

## 2023-02-01 ENCOUNTER — Encounter: Payer: Self-pay | Admitting: Physician Assistant

## 2023-02-02 ENCOUNTER — Ambulatory Visit (HOSPITAL_BASED_OUTPATIENT_CLINIC_OR_DEPARTMENT_OTHER): Payer: Medicare Other | Attending: Physician Assistant | Admitting: Internal Medicine

## 2023-02-02 DIAGNOSIS — G4733 Obstructive sleep apnea (adult) (pediatric): Secondary | ICD-10-CM | POA: Insufficient documentation

## 2023-02-02 DIAGNOSIS — G478 Other sleep disorders: Secondary | ICD-10-CM

## 2023-02-02 DIAGNOSIS — G4710 Hypersomnia, unspecified: Secondary | ICD-10-CM | POA: Insufficient documentation

## 2023-02-06 ENCOUNTER — Telehealth: Payer: Self-pay | Admitting: Family Medicine

## 2023-02-06 NOTE — Telephone Encounter (Signed)
States she has a physical next month.  I think that would be a great time to get updated labs as it could also be coded underneath her physical.

## 2023-02-06 NOTE — Telephone Encounter (Signed)
Patient is requesting lab work she is complaining of being tired very sleepy all the time can't seem to stay awake. Please advise

## 2023-02-07 DIAGNOSIS — M542 Cervicalgia: Secondary | ICD-10-CM | POA: Diagnosis not present

## 2023-02-07 DIAGNOSIS — M545 Low back pain, unspecified: Secondary | ICD-10-CM | POA: Diagnosis not present

## 2023-02-07 DIAGNOSIS — M9901 Segmental and somatic dysfunction of cervical region: Secondary | ICD-10-CM | POA: Diagnosis not present

## 2023-02-07 DIAGNOSIS — M9902 Segmental and somatic dysfunction of thoracic region: Secondary | ICD-10-CM | POA: Diagnosis not present

## 2023-02-07 DIAGNOSIS — M9904 Segmental and somatic dysfunction of sacral region: Secondary | ICD-10-CM | POA: Diagnosis not present

## 2023-02-07 DIAGNOSIS — M9903 Segmental and somatic dysfunction of lumbar region: Secondary | ICD-10-CM | POA: Diagnosis not present

## 2023-02-07 NOTE — Telephone Encounter (Signed)
Patient informed and will do lab work at physical next month.

## 2023-02-10 DIAGNOSIS — G478 Other sleep disorders: Secondary | ICD-10-CM

## 2023-02-10 NOTE — Procedures (Signed)
    Patient Name: Demi, Dreese Date: 02/03/2023 Gender: Female D.O.B: 06/19/1956 Age (years): 66 Referring Provider: Tandy Gaw Height (inches): 60 Interpreting Physician: Jetty Duhamel MD, ABSM Weight (lbs): 110 RPSGT: [place] BMI: 21 MRN: [id number] Neck Size: 13.75  CLINICAL INFORMATION Sleep Study Type: HST Indication for sleep study: somnolence Epworth Sleepiness Score: 14  SLEEP STUDY TECHNIQUE A multi-channel overnight portable sleep study was performed. The channels recorded were: nasal airflow, thoracic respiratory movement, and oxygen saturation with a pulse oximetry. Snoring was also monitored.  MEDICATIONS Patient self administered medications include: N/A.  SLEEP ARCHITECTURE Patient was studied for 528.5 minutes. The sleep efficiency was 100.0 % and the patient was supine for 0%. The arousal index was 0.0 per hour.  RESPIRATORY PARAMETERS The overall AHI was 27.0 per hour, with a central apnea index of 0 per hour. The oxygen nadir was 87% during sleep.  CARDIAC DATA Mean heart rate during sleep was 48.8 bpm.  IMPRESSIONS - Moderate obstructive sleep apnea occurred during this study (AHI = 27.0/h). - Mild oxygen desaturation was noted during this study (Min O2 = 87%, Mean 94%). - Patient snored.  DIAGNOSIS - Obstructive Sleep Apnea (G47.33)  RECOMMENDATIONS - Suggest CPAP titration sleep study or autopap. Other options would be based on clinical judgment. - Be careful with alcohol, sedatives and other CNS depressants that may worsen sleep apnea and disrupt normal sleep architecture. - Sleep hygiene should be reviewed to assess factors that may improve sleep quality. - Weight management and regular exercise should be initiated or continued.  [Electronically signed] 02/10/2023 12:34 PM  Jetty Duhamel MD, ABSM Diplomate, American Board of Sleep Medicine NPI: [id number]                         Jetty Duhamel Diplomate, American Board of Sleep Medicine  ELECTRONICALLY SIGNED ON:  02/10/2023, 12:27 PM [facility] PH: [phone]   FX: [phone] ACCREDITED BY THE AMERICAN ACADEMY OF SLEEP MEDICINE

## 2023-02-14 ENCOUNTER — Ambulatory Visit (INDEPENDENT_AMBULATORY_CARE_PROVIDER_SITE_OTHER): Payer: Medicare Other | Admitting: Physician Assistant

## 2023-02-14 ENCOUNTER — Encounter: Payer: Self-pay | Admitting: Physician Assistant

## 2023-02-14 VITALS — BP 115/79 | HR 67 | Ht 60.0 in | Wt 114.0 lb

## 2023-02-14 DIAGNOSIS — G4733 Obstructive sleep apnea (adult) (pediatric): Secondary | ICD-10-CM

## 2023-02-14 DIAGNOSIS — E039 Hypothyroidism, unspecified: Secondary | ICD-10-CM | POA: Diagnosis not present

## 2023-02-14 DIAGNOSIS — Z79899 Other long term (current) drug therapy: Secondary | ICD-10-CM | POA: Diagnosis not present

## 2023-02-14 MED ORDER — LEVOTHYROXINE SODIUM 100 MCG PO TABS
100.0000 ug | ORAL_TABLET | Freq: Every day | ORAL | 0 refills | Status: DC
Start: 2023-02-14 — End: 2023-03-07

## 2023-02-14 NOTE — Progress Notes (Signed)
   Established Patient Office Visit  Subjective   Patient ID: Judy Russell, female    DOB: May 24, 1956  Age: 67 y.o. MRN: [id number]  Chief Complaint  Patient presents with   Medical Management of Chronic Issues    Medication change     HPI Pt is a 67 yo female who wants to discuss options for manufactor of thyroid medication changing.   She would like to go over sleep study as well.     Review of Systems  All other systems reviewed and are negative.     Objective:     BP 115/79   Pulse 67   Ht 5' (1.524 m)   Wt 114 lb (51.7 kg)   SpO2 99%   BMI 22.26 kg/m  BP Readings from Last 3 Encounters:  02/14/23 115/79  01/15/23 (!) 147/78  12/29/22 (!) 147/77   Wt Readings from Last 3 Encounters:  02/14/23 114 lb (51.7 kg)  01/15/23 119 lb (54 kg)  12/29/22 117 lb (53.1 kg)      Physical Exam Constitutional:      Appearance: Normal appearance.  HENT:     Head: Normocephalic.  Cardiovascular:     Rate and Rhythm: Normal rate.  Pulmonary:     Effort: Pulmonary effort is normal.  Neurological:     Mental Status: She is alert.  Psychiatric:        Mood and Affect: Mood normal.       RECOMMENDATIONS - Suggest CPAP titration sleep study or autopap. Other options would be based on clinical judgment. - Be careful with alcohol, sedatives and other CNS depressants that may worsen sleep apnea and disrupt normal sleep architecture. - Sleep hygiene should be reviewed to assess factors that may improve sleep quality. - Weight management and regular exercise should be initiated or continued.    Assessment & Plan:  Marland KitchenMarland KitchenKeeva "VI" was seen today for medical management of chronic issues.  Diagnoses and all orders for this visit:  Hypothyroidism, unspecified type -     levothyroxine (SYNTHROID) 100 MCG tablet; Take 1 tablet (100 mcg total) by mouth daily. [employer] Manufacture ONLY.  Moderate obstructive sleep apnea  Medication management   Manufactor is changing with  thyroid medication and patient concerned about the corn concentration in new brand Written rx for mylan brand for patient to look for manufactor Pt mentions taking to compounding pharmacy that I could send order to and then recheck levels in 4 weeks  Discussed moderate sleep apnea and risk of not treating Pt declines CPaP machine at this time stating she wanted to think about it more  Return in about 4 weeks (around 03/14/2023), or if symptoms worsen or fail to improve.    Tandy Gaw, PA-C

## 2023-02-14 NOTE — Patient Instructions (Addendum)
Med solutions to discuss thyroid medication  [facility] [address] Google reviews  Small business[place]armacy in [place], [place] Located in: [employer] Address: [address], [place], [place] [address] Hours:  Open ? Closes 6?PM   More hours Phone: [phone]

## 2023-02-18 DIAGNOSIS — M47819 Spondylosis without myelopathy or radiculopathy, site unspecified: Secondary | ICD-10-CM | POA: Diagnosis not present

## 2023-02-19 ENCOUNTER — Encounter: Payer: Self-pay | Admitting: Physician Assistant

## 2023-02-19 DIAGNOSIS — E039 Hypothyroidism, unspecified: Secondary | ICD-10-CM

## 2023-02-20 MED ORDER — TIROSINT 100 MCG PO CAPS: 1.0000 | ORAL_CAPSULE | Freq: Every day | ORAL | 1 refills | Status: DC

## 2023-02-21 ENCOUNTER — Ambulatory Visit (INDEPENDENT_AMBULATORY_CARE_PROVIDER_SITE_OTHER): Payer: Medicare Other | Admitting: Physician Assistant

## 2023-02-21 ENCOUNTER — Encounter: Payer: Self-pay | Admitting: Physician Assistant

## 2023-02-21 VITALS — BP 145/80 | HR 80 | Ht 60.0 in | Wt 114.0 lb

## 2023-02-21 DIAGNOSIS — Z79899 Other long term (current) drug therapy: Secondary | ICD-10-CM

## 2023-02-21 DIAGNOSIS — E039 Hypothyroidism, unspecified: Secondary | ICD-10-CM | POA: Diagnosis not present

## 2023-02-21 NOTE — Progress Notes (Signed)
Established Patient Office Visit  Subjective   Patient ID: Judy Russell, female    DOB: [birth year]  Age: 67 y.o. MRN: [id number]  Chief Complaint  Patient presents with   Medical Management of Chronic Issues    HPI Pt is a 67 yo female who presents to the clinic to discuss thyroid medication. It was sent to the pharmacy but 100 dollars a month and concerned she is not going to be able to afford this for long. She has corn allergy and seems to be the best medication for her to take with the manufactor change. She wants to know what to do.   .. 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   Left leg pain 03/18/2017   Right foot pain 03/18/2017   History of colon resection 07/09/2017   History of irregular heartbeat 07/09/2017   Sinus bradycardia by electrocardiogram 07/09/2017   Hyperlipidemia 07/11/2017   Elevated blood pressure reading 07/11/2017   Memory changes 07/19/2017   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   Perennial allergic rhinitis 08/13/2017   CFS (chronic fatigue syndrome) 11/27/2017   Multiple food allergies 03/25/2018   Environmental allergies 03/25/2018   Callus of foot 07/29/2018   Tonsillith 07/30/2018   Cervical radiculopathy 08/21/2018   Chronic constipation 10/30/2018   Food allergy 01/29/2019   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   Seborrheic keratoses 05/14/2019   Suspected COVID-19 virus infection 09/11/2019   COVID-19 virus infection 09/22/2019   History of COVID-19 10/07/2019   Mold exposure 12/24/2019   Chest tightness  12/24/2019   Allergic reaction 01/09/2020   Scalp itch 01/12/2020   Unintended weight loss 02/18/2020   Loss of taste 02/18/2020   Tapeworm 04/02/2020   Toenail fungus 09/14/2020   Corn of toe 09/14/2020   Rectocele 12/06/2020   No energy 02/28/2021   Lack of motivation 02/28/2021   Bipolar 1 disorder, mixed (HCC) 03/21/2021   Hamstring strain, left, initial encounter 04/28/2021   Syncope 05/31/2021   Acute gastritis without hemorrhage 05/31/2021   Chronic bilateral low back pain without sciatica 07/25/2021   Fall 07/26/2021   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 07/27/2021   Chronic pain syndrome 01/10/2022   Abnormality of globulin 01/10/2022   Ingrown right greater toenail 10/17/2022   Dermatitis 10/17/2022   Trouble in sleeping 12/05/2022   Decreased GFR 12/18/2022   Non-restorative sleep 12/18/2022   Hypersomnolence 12/19/2022   Moderate obstructive sleep apnea 02/14/2023   Medication management 02/14/2023   Resolved Ambulatory Problems    Diagnosis Date Noted   URI 09/04/2009   Acute bronchitis 05/03/2016   Essential hypertension 07/19/2017   DDD (degenerative disc disease), cervical 08/21/2018   Past Medical History:  Diagnosis Date   Allergy    HSV-2 (herpes simplex virus 2) infection    Seizures (HCC)    Thyroid disease      ROS    Objective:     BP (!) 160/86   Pulse 60   Ht 5' (1.524 m)   Wt 114 lb (51.7 kg)   SpO2 99%   BMI 22.26 kg/m  BP Readings from Last  3 Encounters:  02/21/23 (!) 145/80  02/14/23 115/79  01/15/23 (!) 147/78   Wt Readings from Last 3 Encounters:  02/21/23 114 lb (51.7 kg)  02/14/23 114 lb (51.7 kg)  01/15/23 119 lb (54 kg)      Physical Exam Constitutional:      Appearance: Normal appearance.  HENT:     Head: Normocephalic.  Cardiovascular:     Rate and Rhythm: Normal rate and regular rhythm.     Heart sounds: Normal heart sounds.  Pulmonary:     Effort: Pulmonary effort is normal.     Breath sounds:  Normal breath sounds.  Neurological:     General: No focal deficit present.     Mental Status: She is alert and oriented to person, place, and time.  Psychiatric:        Mood and Affect: Mood normal.      The 10-year ASCVD risk score (Arnett DK, et al., 2019) is: 8.4%    Assessment & Plan:  Judy Russell KitchenLlasmin "VI" was seen today for medical management of chronic issues.  Diagnoses and all orders for this visit:  Hypothyroidism, unspecified type -     AMB Referral to Pharmacy Medication Management -     TSH  Medication management -     AMB Referral to Pharmacy Medication Management   Start Tirosint  Referral made to pharmacy to see if they can help patient with cost Recheck labs in 6 weeks after making the change  Follow up in 3 months BP improved on 2nd recheck Keep monitoring at home- home readings under 130/80 Tandy Gaw, PA-C

## 2023-02-27 ENCOUNTER — Telehealth: Payer: Self-pay

## 2023-02-27 NOTE — Progress Notes (Signed)
   Care Guide Note  02/27/2023 Name: Talaijah Morehouse MRN: [id number] DOB: 10-05-1955  Referred by: Jomarie Longs, PA-C Reason for referral : Care Coordination (Outreach to schedule with Pharm d )   Aurorah Carriero is a 67 y.o. year old female who is a primary care patient of Jomarie Longs, PA-C. Keimari Pointer was referred to the pharmacist for assistance related to  Med assistance  .    Successful contact was made with the patient to discuss pharmacy services including being ready for the pharmacist to call at least 5 minutes before the scheduled appointment time, to have medication bottles and any blood sugar or blood pressure readings ready for review. The patient agreed to meet with the pharmacist via with the pharmacist via telephone visit on (date/time).  03/14/2023  Penne Lash, RMA Care Guide [facility]  [place], [place] [address] Direct Dial: [phone] [email or web address]

## 2023-02-28 ENCOUNTER — Telehealth: Payer: Self-pay | Admitting: Physician Assistant

## 2023-02-28 NOTE — Telephone Encounter (Signed)
Patient and [employer] called in stating that the patient needs a PA for TIROSINT 100 MCG CAPS. Please advise.  Goes to [facility] [phone]

## 2023-03-02 ENCOUNTER — Telehealth: Payer: Self-pay

## 2023-03-02 NOTE — Telephone Encounter (Signed)
Pt called. She is requesting a back up to her Tirosint if does not get approved.

## 2023-03-02 NOTE — Telephone Encounter (Signed)
Initiated Prior authorization WUJ:[id number]  Via: Covermymeds Case/Key:[id number] Status: Pending as of 03/02/23 Reason: Notified Pt via: Mychart

## 2023-03-02 NOTE — Telephone Encounter (Signed)
I could do BRAND only synthroid or armour thyroid.

## 2023-03-06 ENCOUNTER — Ambulatory Visit (INDEPENDENT_AMBULATORY_CARE_PROVIDER_SITE_OTHER): Payer: Medicare Other | Admitting: Family Medicine

## 2023-03-06 ENCOUNTER — Encounter: Payer: Self-pay | Admitting: Family Medicine

## 2023-03-06 ENCOUNTER — Telehealth: Payer: Self-pay | Admitting: Physician Assistant

## 2023-03-06 VITALS — BP 148/77 | HR 46 | Ht 60.0 in | Wt 118.2 lb

## 2023-03-06 DIAGNOSIS — R001 Bradycardia, unspecified: Secondary | ICD-10-CM | POA: Diagnosis not present

## 2023-03-06 NOTE — Patient Instructions (Signed)
EKG was all normal

## 2023-03-06 NOTE — Progress Notes (Signed)
   Acute Office Visit  Subjective:     Patient ID: Judy Russell, female    DOB: [birth year], 67 y.o.   MRN: [id number]  Chief Complaint  Patient presents with   Medical Management of Chronic Issues    Low heart rate    HPI Patient is in today for concerns of bradycardia. She is currently asymptomatic. According to epic review, pt has a hx of hypothyroidism, idiopathic hypotension as well as a hx of sinus bradycardia dating back to 2019.   Pt presents today after being told by home health that her heart rate was low. She denies any symptoms.   Review of Systems  Constitutional:  Negative for chills and fever.  Respiratory:  Negative for cough and shortness of breath.   Cardiovascular:  Negative for chest pain.  Neurological:  Negative for headaches.        Objective:    BP (!) 148/77   Pulse (!) 46   Ht 5' (1.524 m)   Wt 118 lb 4 oz (53.6 kg)   SpO2 100%   BMI 23.09 kg/m    Physical Exam Vitals and nursing note reviewed.  Constitutional:      General: She is not in acute distress.    Appearance: Normal appearance.  HENT:     Head: Normocephalic and atraumatic.     Right Ear: External ear normal.     Left Ear: External ear normal.     Nose: Nose normal.  Eyes:     Conjunctiva/sclera: Conjunctivae normal.  Cardiovascular:     Rate and Rhythm: Normal rate and regular rhythm.  Pulmonary:     Effort: Pulmonary effort is normal.     Breath sounds: Normal breath sounds.  Neurological:     General: No focal deficit present.     Mental Status: She is alert and oriented to person, place, and time.  Psychiatric:        Mood and Affect: Mood normal.        Behavior: Behavior normal.        Thought Content: Thought content normal.        Judgment: Judgment normal.     No results found for any visits on 03/06/23.      Assessment & Plan:   Problem List Items Addressed This Visit       Other   Bradycardia - Primary    - EKG shows sinus rhythm with HR of 45   - no arrhythmias seen.  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 With her being a runner it is her normal to have a lower heart rate in the 40-50s.       Relevant Orders   EKG 12-Lead    No orders of the defined types were placed in this encounter.   No follow-ups on file.  Charlton Amor, DO

## 2023-03-06 NOTE — Assessment & Plan Note (Addendum)
-   EKG shows sinus rhythm with HR of 45  - no arrhythmias seen.  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 With her being a runner it is her normal to have a lower heart rate in the 40-50s.

## 2023-03-06 NOTE — Telephone Encounter (Signed)
Error

## 2023-03-07 MED ORDER — LEVOTHYROXINE SODIUM 100 MCG PO TABS
100.0000 ug | ORAL_TABLET | Freq: Every day | ORAL | 0 refills | Status: DC
Start: 2023-03-07 — End: 2023-05-30

## 2023-03-07 NOTE — Addendum Note (Signed)
Addended by: Chalmers Cater on: 03/07/2023 01:27 PM   Modules accepted: Orders

## 2023-03-08 ENCOUNTER — Telehealth: Payer: Self-pay | Admitting: Physician Assistant

## 2023-03-08 NOTE — Telephone Encounter (Signed)
Judy Russell with [facility] called.  They cannot get the Levothyroxine, not sure if company makes it anymore.  They do have Accord and patient is okay with this medication.

## 2023-03-09 NOTE — Telephone Encounter (Signed)
A new script is not needed.

## 2023-03-09 NOTE — Telephone Encounter (Signed)
Ok to fill accord. Do I need to send another rx?

## 2023-03-14 ENCOUNTER — Other Ambulatory Visit: Payer: Medicare Other

## 2023-03-14 NOTE — Progress Notes (Signed)
   03/14/2023  Patient ID: Judy Russell, female   DOB: 14-Jul-1955, 67 y.o.   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, so she was prescribed Tirosint capsules versus levothyroxine due to corn content -Tirosint is not covered by her plan, and she states if it were to be approved the copay would still be $100 -She is currently taking levothyroxine tablets and does endorse brain fog and fatigue she believes to be related to corn content of product  A/P  Medication Access/Adherence -Discussed the following options: resubmitting for prior authorization/appeal for Tirosint, Tirosint Direct program that supplies medication for $65/month -Patient states that Tirosint capsules are also likely to contain corn, so she will continue with levothyroxine therapy for now (endorses using levothyroxine since she was in her 67's)  Follow-up:  Sending MyChart message with my direct phone number in case patient would like for me to pursue appeal/tier reduction with insurance  Lenna Gilford, PharmD, DPLA

## 2023-03-21 DIAGNOSIS — M47819 Spondylosis without myelopathy or radiculopathy, site unspecified: Secondary | ICD-10-CM | POA: Diagnosis not present

## 2023-04-02 ENCOUNTER — Ambulatory Visit (INDEPENDENT_AMBULATORY_CARE_PROVIDER_SITE_OTHER): Payer: Medicare Other | Admitting: Physician Assistant

## 2023-04-02 DIAGNOSIS — Z780 Asymptomatic menopausal state: Secondary | ICD-10-CM

## 2023-04-02 DIAGNOSIS — Z0000 Encounter for general adult medical examination without abnormal findings: Secondary | ICD-10-CM

## 2023-04-02 NOTE — Progress Notes (Signed)
MEDICARE ANNUAL WELLNESS VISIT  04/02/2023  Telephone Visit Disclaimer This Medicare AWV was conducted by telephone due to national recommendations for restrictions regarding the COVID-19 Pandemic (e.g. social distancing).  I verified, using two identifiers, that I am speaking with Judy Russell or their authorized healthcare agent. I discussed the limitations, risks, security, and privacy concerns of performing an evaluation and management service by telephone and the potential availability of an in-person appointment in the future. The patient expressed understanding and agreed to proceed.  Location of Patient: Home Location of Provider (nurse):  In the office.  Subjective:    Judy Russell is a 67 y.o. female patient of Caleen Essex, Lonna Cobb, PA-C who had a [employer] Visit today via telephone. Judy Russell is Retired and lives with their spouse. she has 2 children. she reports that she is socially active and does interact with friends/family regularly. she is moderately physically active and enjoys cooking.  Patient Care Team: Nolene Ebbs as PCP - General (Family Medicine) Lenna Gilford, [facility] (Pharmacist)     04/02/2023   10:05 AM 03/27/2022    9:00 AM 08/08/2021   11:03 AM 12/01/2020    8:04 AM 12/01/2020    8:03 AM  Advanced Directives  Does Patient Have a Medical Advance Directive? No No No No No  Would patient like information on creating a medical advance directive? No - Patient declined No - Patient declined Yes (MAU/Ambulatory/Procedural Areas - Information given) No - Patient declined No - Patient declined    Hospital Utilization Over the Past 12 Months: # of hospitalizations or ER visits: 0 # of surgeries: 0  Review of Systems    Patient reports that her overall health is unchanged compared to last year.  History obtained from chart review and the patient  Patient Reported Readings (BP, Pulse, CBG, Weight, etc) none Per patient no change in vitals  since last visit, unable to obtain new vitals due to telehealth visit  Pain Assessment Pain : No/denies pain     Current Medications & Allergies (verified) Allergies as of 04/02/2023       Reactions   Decadron [dexamethasone]    Jittery/anxiety/shaking   Wellbutrin [bupropion]    seizures   Atrovent [ipratropium]    Dizzy and weak after using.   Carafate [sucralfate]    Lip irritation   Doxycycline    Vomiting, diarrhea, brain fog, headache   Corn-containing Products Itching   Valtrex [valacyclovir Hcl]    Headache, nausea   Wheat         Medication List        Accurate as of April 02, 2023 10:18 AM. If you have any questions, ask your nurse or doctor.          lamoTRIgine 100 MG tablet Commonly known as: LAMICTAL TAKE 1 TABLET BY MOUTH DAILY  WITH 150 MG TABLET AS DIRECTED   lamoTRIgine 150 MG tablet Commonly known as: LAMICTAL TAKE 1 TABLET BY MOUTH DAILY  TAKING WITH THE 100 MG TABLET   levothyroxine 100 MCG tablet Commonly known as: SYNTHROID Take 1 tablet (100 mcg total) by mouth daily. [employer] Manufacture ONLY.   polyethylene glycol 17 g packet Commonly known as: MIRALAX / GLYCOLAX Take 17 g by mouth daily.   triamcinolone cream 0.1 % Commonly known as: KENALOG Apply 1 Application topically 2 (two) times daily. As needed for rash.        History (reviewed): Past Medical History:  Diagnosis Date  Allergy    Fibromyalgia    HSV-2 (herpes simplex virus 2) infection    Seizures (HCC)    Thyroid disease    Past Surgical History:  Procedure Laterality Date   APPENDECTOMY     DILATION AND CURETTAGE OF UTERUS     OOPHORECTOMY Left    Family History  Problem Relation Age of Onset   Cancer Mother        breast   Diabetes Mother    Alcohol abuse Father    Hypertension Father    Stroke Father    Social History   Socioeconomic History   Marital status: Married    Spouse name: Dennard Nip   Number of children: 2   Years of education:  12   Highest education level: 12th grade  Occupational History    Comment: Disabled  Tobacco Use   Smoking status: Never   Smokeless tobacco: Never  Vaping Use   Vaping status: Never Used  Substance and Sexual Activity   Alcohol use: No   Drug use: No   Sexual activity: Not Currently  Other Topics Concern   Not on file  Social History Narrative   Lives with her spouse. Likes to cook in her free time.    Social Determinants of Health   Financial Resource Strain: Low Risk  (04/02/2023)   Overall Financial Resource Strain (CARDIA)    Difficulty of Paying Living Expenses: Not hard at all  Food Insecurity: No Food Insecurity (04/02/2023)   Hunger Vital Sign    Worried About Running Out of Food in the Last Year: Never true    Ran Out of Food in the Last Year: Never true  Transportation Needs: No Transportation Needs (04/02/2023)   PRAPARE - Administrator, Civil Service (Medical): No    Lack of Transportation (Non-Medical): No  Physical Activity: Insufficiently Active (04/02/2023)   Exercise Vital Sign    Days of Exercise per Week: 4 days    Minutes of Exercise per Session: 20 min  Stress: No Stress Concern Present (04/02/2023)   [employer] of Occupational Health - Occupational Stress Questionnaire    Feeling of Stress : Not at all  Social Connections: Moderately Integrated (04/02/2023)   Social Connection and Isolation Panel [NHANES]    Frequency of Communication with Friends and Family: More than three times a week    Frequency of Social Gatherings with Friends and Family: More than three times a week    Attends Religious Services: More than 4 times per year    Active Member of [employer] or Organizations: No    Attends Banker Meetings: Never    Marital Status: Married    Activities of Daily Living    04/02/2023   10:10 AM  In your present state of health, do you have any difficulty performing the following activities:  Hearing? 1  Comment  bilateral hearing aids  Vision? 0  Difficulty concentrating or making decisions? 1  Comment some memory loss  Walking or climbing stairs? 0  Dressing or bathing? 0  Doing errands, shopping? 0  Preparing Food and eating ? N  Using the Toilet? N  In the past six months, have you accidently leaked urine? N  Do you have problems with loss of bowel control? N  Managing your Medications? N  Managing your Finances? N  Housekeeping or managing your Housekeeping? N    Patient Education/ Literacy How often do you need to have someone help you when you read  instructions, pamphlets, or other written materials from your doctor or pharmacy?: 1 - Never What is the last grade level you completed in school?: 2 years of college  Exercise    Diet Patient reports consuming 2 meals a day and 2 snack(s) a day Patient reports that her primary diet is: Regular Patient reports that she does have regular access to food.   Depression Screen    04/02/2023   10:05 AM 12/29/2022   10:14 AM 12/18/2022    2:45 PM 10/17/2022    1:18 PM 08/29/2022    2:23 PM 03/27/2022    9:01 AM 03/18/2021    4:33 PM  PHQ 2/9 Scores  PHQ - 2 Score 0 0 0 0 0 0 0  PHQ- 9 Score       0     Fall Risk    04/02/2023   10:05 AM 01/15/2023    2:19 PM 12/29/2022   10:14 AM 12/18/2022    2:45 PM 08/29/2022    2:23 PM  Fall Risk   Falls in the past year? 1 1 1 1  0  Number falls in past yr: 1 1 1 1  0  Injury with Fall? 1 1 1  0 0  Risk for fall due to : History of fall(s) History of fall(s) History of fall(s) No Fall Risks No Fall Risks  Follow up Falls evaluation completed;Education provided;Falls prevention discussed Falls evaluation completed Falls evaluation completed Falls evaluation completed Falls evaluation completed     Objective:  Judy Russell seemed alert and oriented and she participated appropriately during our telephone visit.  Blood Pressure Weight BMI  BP Readings from Last 3 Encounters:  03/06/23 (!) 148/77   02/21/23 (!) 145/80  02/14/23 115/79   Wt Readings from Last 3 Encounters:  03/06/23 118 lb 4 oz (53.6 kg)  02/21/23 114 lb (51.7 kg)  02/14/23 114 lb (51.7 kg)   BMI Readings from Last 1 Encounters:  03/06/23 23.09 kg/m    *Unable to obtain current vital signs, weight, and BMI due to telephone visit type  Hearing/Vision  Judy Russell did not seem to have difficulty with hearing/understanding during the telephone conversation Reports that she has had a formal eye exam by an eye care professional within the past year Reports that she has had a formal hearing evaluation within the past year *Unable to fully assess hearing and vision during telephone visit type  Cognitive Function:    04/02/2023   10:12 AM 03/27/2022    9:08 AM 12/01/2020    8:16 AM 07/19/2017   10:49 PM  6CIT Screen  What Year? 0 points 0 points 0 points 0 points  What month? 0 points 0 points 0 points 0 points  What time? 0 points 3 points 0 points 0 points  Count back from 20 0 points 0 points 0 points 0 points  Months in reverse 0 points 0 points 2 points 0 points  Repeat phrase 2 points 0 points 2 points 2 points  Total Score 2 points 3 points 4 points 2 points   (Normal:0-7, Significant for Dysfunction: >8)  Normal Cognitive Function Screening: Yes   Immunization & Health Maintenance Record  There is no immunization history on file for this patient.  Health Maintenance  Topic Date Due   Zoster Vaccines- Shingrix (1 of 2) 07/02/2023 (Originally 02/14/1975)   COVID-19 Vaccine (1) 08/06/2023 (Originally 02/15/1961)   Cervical Cancer Screening (HPV/Pap Cotest)  04/01/2024 (Originally 02/15/1988)   Pneumonia Vaccine 45+ Years old (1 of  1 - PCV) 04/01/2024 (Originally 02/15/2023)   MAMMOGRAM  04/01/2024 (Originally 02/15/2008)   DEXA SCAN  04/01/2024 (Originally 02/15/2023)   Medicare Annual Wellness (AWV)  04/01/2024   Fecal DNA (Cologuard)  06/06/2024   Hepatitis C Screening  Completed   HIV Screening  Completed    HPV VACCINES  Aged Out   DTaP/Tdap/Td  Discontinued   INFLUENZA VACCINE  Discontinued       Assessment  This is a routine wellness examination for Judy Russell.  Health Maintenance: Due or Overdue There are no preventive care reminders to display for this patient.   Judy Russell does not need a referral for Community Assistance: Care Management:   no Social Work:    no Prescription Assistance:  no Nutrition/Diabetes Education:  no   Plan:  Personalized Goals  Goals Addressed               This Visit's Progress     Patient Stated (pt-stated)        04/02/2023 AWV Goal: Exercise for General Health  Patient will verbalize understanding of the benefits of increased physical activity: Exercising regularly is important. It will improve your overall fitness, flexibility, and endurance. Regular exercise also will improve your overall health. It can help you control your weight, reduce stress, and improve your bone density. Over the next year, patient will increase physical activity as tolerated with a goal of at least 150 minutes of moderate physical activity per week.  You can tell that you are exercising at a moderate intensity if your heart starts beating faster and you start breathing faster but can still hold a conversation. Moderate-intensity exercise ideas include: Walking 1 mile (1.6 km) in about 15 minutes Biking Hiking Golfing Dancing Water aerobics Patient will verbalize understanding of everyday activities that increase physical activity by providing examples like the following: Yard work, such as: Insurance underwriter Gardening Washing windows or floors Patient will be able to explain general safety guidelines for exercising:  Before you start a new exercise program, talk with your health care provider. Do not exercise so much that you hurt yourself, feel dizzy, or get very  short of breath. Wear comfortable clothes and wear shoes with good support. Drink plenty of water while you exercise to prevent dehydration or heat stroke. Work out until your breathing and your heartbeat get faster.        Personalized Health Maintenance & Screening Recommendations  Bone densitometry screening Shingles vaccine - Patient declined Pneumonia vaccine - Patient declined Mammogram - Patient declined Cervical cancer screening - Patient declined  Lung Cancer Screening Recommended: no (Low Dose CT Chest recommended if Age 51-80 years, 20 pack-year currently smoking OR have quit w/in past 15 years) Hepatitis C Screening recommended: no HIV Screening recommended: no  Advanced Directives: Written information was not prepared per patient's request.  Referrals & Orders Orders Placed This Encounter  Procedures   DEXAScan    Follow-up Plan Follow-up with Jomarie Longs, PA-C as planned Medicare wellness visit in one year.  Patient will access AVS on my chart.   I have personally reviewed and noted the following in the patient's chart:   Medical and social history Use of alcohol, tobacco or illicit drugs  Current medications and supplements Functional ability and status Nutritional status Physical activity Advanced directives List of other physicians Hospitalizations, surgeries, and ER visits in previous 12 months Vitals Screenings to include cognitive,  depression, and falls Referrals and appointments  In addition, I have reviewed and discussed with Judy Russell certain preventive protocols, quality metrics, and best practice recommendations. A written personalized care plan for preventive services as well as general preventive health recommendations is available and can be mailed to the patient at her request.      Modesto Charon, RN BSN  04/02/2023

## 2023-04-02 NOTE — Patient Instructions (Addendum)
MEDICARE ANNUAL WELLNESS VISIT Health Maintenance Summary and Written Plan of Care  Judy Russell ,  Thank you for allowing me to perform your Medicare Annual Wellness Visit and for your ongoing commitment to your health.   Health Maintenance & Immunization History Health Maintenance  Topic Date Due   Zoster Vaccines- Shingrix (1 of 2) 07/02/2023 (Originally 02/14/1977)   COVID-19 Vaccine (1) 08/06/2023 (Originally 02/15/1963)   Cervical Cancer Screening (HPV/Pap Cotest)  04/01/2024 (Originally 02/15/1988)   Pneumonia Vaccine 36+ Years old (1 of 1 - PCV) 04/01/2024 (Originally 02/15/2023)   MAMMOGRAM  04/01/2024 (Originally 02/15/2008)   DEXA SCAN  04/01/2024 (Originally 02/15/2023)   Medicare Annual Wellness (AWV)  04/01/2024   Fecal DNA (Cologuard)  06/06/2024   Hepatitis C Screening  Completed   HIV Screening  Completed   HPV VACCINES  Aged Out   DTaP/Tdap/Td  Discontinued   INFLUENZA VACCINE  Discontinued    There is no immunization history on file for this patient.  These are the patient goals that we discussed:  Goals Addressed               This Visit's Progress     Patient Stated (pt-stated)        04/02/2023 AWV Goal: Exercise for General Health  Patient will verbalize understanding of the benefits of increased physical activity: Exercising regularly is important. It will improve your overall fitness, flexibility, and endurance. Regular exercise also will improve your overall health. It can help you control your weight, reduce stress, and improve your bone density. Over the next year, patient will increase physical activity as tolerated with a goal of at least 150 minutes of moderate physical activity per week.  You can tell that you are exercising at a moderate intensity if your heart starts beating faster and you start breathing faster but can still hold a conversation. Moderate-intensity exercise ideas include: Walking 1 mile (1.6 km) in about 15  minutes Biking Hiking Golfing Dancing Water aerobics Patient will verbalize understanding of everyday activities that increase physical activity by providing examples like the following: Yard work, such as: Insurance underwriter Gardening Washing windows or floors Patient will be able to explain general safety guidelines for exercising:  Before you start a new exercise program, talk with your health care provider. Do not exercise so much that you hurt yourself, feel dizzy, or get very short of breath. Wear comfortable clothes and wear shoes with good support. Drink plenty of water while you exercise to prevent dehydration or heat stroke. Work out until your breathing and your heartbeat get faster.          This is a list of Health Maintenance Items that are overdue or due now: Bone densitometry screening Shingles vaccine - Patient declined Pneumonia vaccine - Patient declined Mammogram - Patient declined Cervical cancer screening - Patient declined    Orders/Referrals Placed Today: Orders Placed This Encounter  Procedures   DEXAScan    Standing Status:   Future    Standing Expiration Date:   04/01/2024    Scheduling Instructions:     Please call patient to schedule    Order Specific Question:   Reason for exam:    Answer:   post menopausal    Order Specific Question:   Preferred imaging location?    Answer:   [facility]    (Contact our referral department at [phone] if you have not spoken  with someone about your referral appointment within the next 5 days)    Follow-up Plan Follow-up with Jomarie Longs, PA-C as planned Medicare wellness visit in one year.  Patient will access AVS on my chart.      Health Maintenance, Female Adopting a healthy lifestyle and getting preventive care are important in promoting health and wellness. Ask your health care provider  about: The right schedule for you to have regular tests and exams. Things you can do on your own to prevent diseases and keep yourself healthy. What should I know about diet, weight, and exercise? Eat a healthy diet  Eat a diet that includes plenty of vegetables, fruits, low-fat dairy products, and lean protein. Do not eat a lot of foods that are high in solid fats, added sugars, or sodium. Maintain a healthy weight Body mass index (BMI) is used to identify weight problems. It estimates body fat based on height and weight. Your health care provider can help determine your BMI and help you achieve or maintain a healthy weight. Get regular exercise Get regular exercise. This is one of the most important things you can do for your health. Most adults should: Exercise for at least 150 minutes each week. The exercise should increase your heart rate and make you sweat (moderate-intensity exercise). Do strengthening exercises at least twice a week. This is in addition to the moderate-intensity exercise. Spend less time sitting. Even light physical activity can be beneficial. Watch cholesterol and blood lipids Have your blood tested for lipids and cholesterol at 65 years of age, then have this test every 5 years. Have your cholesterol levels checked more often if: Your lipid or cholesterol levels are high. You are older than 65 years of age. You are at high risk for heart disease. What should I know about cancer screening? Depending on your health history and family history, you may need to have cancer screening at various ages. This may include screening for: Breast cancer. Cervical cancer. Colorectal cancer. Skin cancer. Lung cancer. What should I know about heart disease, diabetes, and high blood pressure? Blood pressure and heart disease High blood pressure causes heart disease and increases the risk of stroke. This is more likely to develop in people who have high blood pressure readings  or are overweight. Have your blood pressure checked: Every 3-5 years if you are 6-77 years of age. Every year if you are 75 years old or older. Diabetes Have regular diabetes screenings. This checks your fasting blood sugar level. Have the screening done: Once every three years after age 29 if you are at a normal weight and have a low risk for diabetes. More often and at a younger age if you are overweight or have a high risk for diabetes. What should I know about preventing infection? Hepatitis B If you have a higher risk for hepatitis B, you should be screened for this virus. Talk with your health care provider to find out if you are at risk for hepatitis B infection. Hepatitis C Testing is recommended for: Everyone born from [birth year] through 1965. Anyone with known risk factors for hepatitis C. Sexually transmitted infections (STIs) Get screened for STIs, including gonorrhea and chlamydia, if: You are sexually active and are younger than 65 years of age. You are older than 65 years of age and your health care provider tells you that you are at risk for this type of infection. Your sexual activity has changed since you were last screened, and you are  at increased risk for chlamydia or gonorrhea. Ask your health care provider if you are at risk. Ask your health care provider about whether you are at high risk for HIV. Your health care provider may recommend a prescription medicine to help prevent HIV infection. If you choose to take medicine to prevent HIV, you should first get tested for HIV. You should then be tested every 3 months for as long as you are taking the medicine. Pregnancy If you are about to stop having your period (premenopausal) and you may become pregnant, seek counseling before you get pregnant. Take 400 to 800 micrograms (mcg) of folic acid every day if you become pregnant. Ask for birth control (contraception) if you want to prevent pregnancy. Osteoporosis and  menopause Osteoporosis is a disease in which the bones lose minerals and strength with aging. This can result in bone fractures. If you are 44 years old or older, or if you are at risk for osteoporosis and fractures, ask your health care provider if you should: Be screened for bone loss. Take a calcium or vitamin D supplement to lower your risk of fractures. Be given hormone replacement therapy (HRT) to treat symptoms of menopause. Follow these instructions at home: Alcohol use Do not drink alcohol if: Your health care provider tells you not to drink. You are pregnant, may be pregnant, or are planning to become pregnant. If you drink alcohol: Limit how much you have to: 0-1 drink a day. Know how much alcohol is in your drink. In the [place] one drink equals one 12 oz bottle of beer (355 mL), one 5 oz glass of wine (148 mL), or one 1 oz glass of hard liquor (44 mL). Lifestyle Do not use any products that contain nicotine or tobacco. These products include cigarettes, chewing tobacco, and vaping devices, such as e-cigarettes. If you need help quitting, ask your health care provider. Do not use street drugs. Do not share needles. Ask your health care provider for help if you need support or information about quitting drugs. General instructions Schedule regular health, dental, and eye exams. Stay current with your vaccines. Tell your health care provider if: You often feel depressed. You have ever been abused or do not feel safe at home. Summary Adopting a healthy lifestyle and getting preventive care are important in promoting health and wellness. Follow your health care provider's instructions about healthy diet, exercising, and getting tested or screened for diseases. Follow your health care provider's instructions on monitoring your cholesterol and blood pressure. This information is not intended to replace advice given to you by your health care provider. Make sure you discuss any  questions you have with your health care provider. Document Revised: 11/08/2020 Document Reviewed: 11/08/2020 Elsevier Patient Education  2024 [employer].

## 2023-04-10 DIAGNOSIS — E039 Hypothyroidism, unspecified: Secondary | ICD-10-CM | POA: Diagnosis not present

## 2023-04-11 ENCOUNTER — Other Ambulatory Visit: Payer: Medicare Other

## 2023-04-11 ENCOUNTER — Encounter: Payer: Self-pay | Admitting: Physician Assistant

## 2023-04-11 LAB — TSH: TSH: 2.05 u[IU]/mL (ref 0.450–4.500)

## 2023-04-11 NOTE — Progress Notes (Signed)
Thyroid looks GREAT. Stay at his dose. Recheck in 3 months.

## 2023-04-18 DIAGNOSIS — M545 Low back pain, unspecified: Secondary | ICD-10-CM | POA: Diagnosis not present

## 2023-04-18 DIAGNOSIS — M9902 Segmental and somatic dysfunction of thoracic region: Secondary | ICD-10-CM | POA: Diagnosis not present

## 2023-04-18 DIAGNOSIS — M9904 Segmental and somatic dysfunction of sacral region: Secondary | ICD-10-CM | POA: Diagnosis not present

## 2023-04-18 DIAGNOSIS — M9903 Segmental and somatic dysfunction of lumbar region: Secondary | ICD-10-CM | POA: Diagnosis not present

## 2023-04-18 DIAGNOSIS — M9901 Segmental and somatic dysfunction of cervical region: Secondary | ICD-10-CM | POA: Diagnosis not present

## 2023-04-19 DIAGNOSIS — M9903 Segmental and somatic dysfunction of lumbar region: Secondary | ICD-10-CM | POA: Diagnosis not present

## 2023-04-19 DIAGNOSIS — M9901 Segmental and somatic dysfunction of cervical region: Secondary | ICD-10-CM | POA: Diagnosis not present

## 2023-04-19 DIAGNOSIS — M9904 Segmental and somatic dysfunction of sacral region: Secondary | ICD-10-CM | POA: Diagnosis not present

## 2023-04-19 DIAGNOSIS — M545 Low back pain, unspecified: Secondary | ICD-10-CM | POA: Diagnosis not present

## 2023-04-19 DIAGNOSIS — M4125 Other idiopathic scoliosis, thoracolumbar region: Secondary | ICD-10-CM | POA: Diagnosis not present

## 2023-04-19 DIAGNOSIS — M9902 Segmental and somatic dysfunction of thoracic region: Secondary | ICD-10-CM | POA: Diagnosis not present

## 2023-04-20 DIAGNOSIS — M47819 Spondylosis without myelopathy or radiculopathy, site unspecified: Secondary | ICD-10-CM | POA: Diagnosis not present

## 2023-04-24 ENCOUNTER — Encounter: Payer: Self-pay | Admitting: Family Medicine

## 2023-04-24 ENCOUNTER — Ambulatory Visit (INDEPENDENT_AMBULATORY_CARE_PROVIDER_SITE_OTHER): Payer: Medicare Other

## 2023-04-24 ENCOUNTER — Ambulatory Visit (INDEPENDENT_AMBULATORY_CARE_PROVIDER_SITE_OTHER): Payer: Medicare Other | Admitting: Family Medicine

## 2023-04-24 VITALS — BP 149/74 | HR 45 | Ht 60.0 in | Wt 121.0 lb

## 2023-04-24 DIAGNOSIS — M19071 Primary osteoarthritis, right ankle and foot: Secondary | ICD-10-CM | POA: Diagnosis not present

## 2023-04-24 DIAGNOSIS — M79671 Pain in right foot: Secondary | ICD-10-CM

## 2023-04-24 DIAGNOSIS — M7989 Other specified soft tissue disorders: Secondary | ICD-10-CM | POA: Diagnosis not present

## 2023-04-24 DIAGNOSIS — M7731 Calcaneal spur, right foot: Secondary | ICD-10-CM | POA: Diagnosis not present

## 2023-04-24 NOTE — Progress Notes (Signed)
Judy Russell - 67 y.o. female MRN [id number]  Date of birth: Jul 09, 1955  Subjective Chief Complaint  Patient presents with   Foot Injury    HPI Judy Russell is a 67 y.o. female here today with complaint of pain in the R foot.  She reports dropping a hammer on the foot about 1 week ago.  Pain has not gotten any better since initial injury.  She has not tried anything for treatment so far.   ROS:  A comprehensive ROS was completed and negative except as noted per HPI  Allergies  Allergen Reactions   Decadron [Dexamethasone]     Jittery/anxiety/shaking   Wellbutrin [Bupropion]     seizures   Atrovent [Ipratropium]     Dizzy and weak after using.   Carafate [Sucralfate]     Lip irritation   Doxycycline     Vomiting, diarrhea, brain fog, headache   Corn-Containing Products Itching   Valtrex [Valacyclovir Hcl]     Headache, nausea   Wheat     Past Medical History:  Diagnosis Date   Allergy    Fibromyalgia    HSV-2 (herpes simplex virus 2) infection    Seizures (HCC)    Thyroid disease     Past Surgical History:  Procedure Laterality Date   APPENDECTOMY     DILATION AND CURETTAGE OF UTERUS     OOPHORECTOMY Left     Social History   Socioeconomic History   Marital status: Married    Spouse name: Dennard Nip   Number of children: 2   Years of education: 12   Highest education level: 12th grade  Occupational History    Comment: Disabled  Tobacco Use   Smoking status: Never   Smokeless tobacco: Never  Vaping Use   Vaping status: Never Used  Substance and Sexual Activity   Alcohol use: No   Drug use: No   Sexual activity: Not Currently  Other Topics Concern   Not on file  Social History Narrative   Lives with her spouse. Likes to cook in her free time.    Social Determinants of Health   Financial Resource Strain: Low Risk  (04/02/2023)   Overall Financial Resource Strain (CARDIA)    Difficulty of Paying Living Expenses: Not hard at all  Food Insecurity: No  Food Insecurity (04/02/2023)   Hunger Vital Sign    Worried About Running Out of Food in the Last Year: Never true    Ran Out of Food in the Last Year: Never true  Transportation Needs: No Transportation Needs (04/02/2023)   PRAPARE - Administrator, Civil Service (Medical): No    Lack of Transportation (Non-Medical): No  Physical Activity: Insufficiently Active (04/02/2023)   Exercise Vital Sign    Days of Exercise per Week: 4 days    Minutes of Exercise per Session: 20 min  Stress: No Stress Concern Present (04/02/2023)   [employer] of Occupational Health - Occupational Stress Questionnaire    Feeling of Stress : Not at all  Social Connections: Moderately Integrated (04/02/2023)   Social Connection and Isolation Panel [NHANES]    Frequency of Communication with Friends and Family: More than three times a week    Frequency of Social Gatherings with Friends and Family: More than three times a week    Attends Religious Services: More than 4 times per year    Active Member of [employer] or Organizations: No    Attends Banker Meetings: Never    Marital Status:  Married    Family History  Problem Relation Age of Onset   Cancer Mother        breast   Diabetes Mother    Alcohol abuse Father    Hypertension Father    Stroke Father     Health Maintenance  Topic Date Due   Zoster Vaccines- Shingrix (1 of 2) 07/02/2023 (Originally 02/14/1977)   COVID-19 Vaccine (1) 08/06/2023 (Originally 02/15/1963)   Cervical Cancer Screening (HPV/Pap Cotest)  04/01/2024 (Originally 02/15/1988)   Pneumonia Vaccine 27+ Years old (1 of 1 - PCV) 04/01/2024 (Originally 02/15/2023)   MAMMOGRAM  04/01/2024 (Originally 02/15/2008)   DEXA SCAN  04/01/2024 (Originally 02/15/2023)   Medicare Annual Wellness (AWV)  04/01/2024   Fecal DNA (Cologuard)  06/06/2024   Hepatitis C Screening  Completed   HIV Screening  Completed   HPV VACCINES  Aged Out   DTaP/Tdap/Td  Discontinued   INFLUENZA  VACCINE  Discontinued     ----------------------------------------------------------------------------------------------------------------------------------------------------------------------------------------------------------------- Physical Exam BP (!) 149/74 (BP Location: Left Arm, Patient Position: Sitting, Cuff Size: Small)   Pulse (!) 45   Ht 5' (1.524 m)   Wt 121 lb (54.9 kg)   SpO2 99%   BMI 23.63 kg/m   Physical Exam Constitutional:      Appearance: Normal appearance.  Musculoskeletal:     Comments: TTP along the top of the R foot.  No significant swelling or bruising noted.   Neurological:     Mental Status: She is alert.     ------------------------------------------------------------------------------------------------------------------------------------------------------------------------------------------------------------------- Assessment and Plan  Right foot pain Xrays of the R foot ordered.    No orders of the defined types were placed in this encounter.   No follow-ups on file.    This visit occurred during the SARS-CoV-2 public health emergency.  Safety protocols were in place, including screening questions prior to the visit, additional usage of staff PPE, and extensive cleaning of exam room while observing appropriate contact time as indicated for disinfecting solutions.

## 2023-04-24 NOTE — Assessment & Plan Note (Signed)
Xrays of the R foot ordered.

## 2023-04-25 DIAGNOSIS — M545 Low back pain, unspecified: Secondary | ICD-10-CM | POA: Diagnosis not present

## 2023-04-25 DIAGNOSIS — M9902 Segmental and somatic dysfunction of thoracic region: Secondary | ICD-10-CM | POA: Diagnosis not present

## 2023-04-25 DIAGNOSIS — M9903 Segmental and somatic dysfunction of lumbar region: Secondary | ICD-10-CM | POA: Diagnosis not present

## 2023-04-25 DIAGNOSIS — M9904 Segmental and somatic dysfunction of sacral region: Secondary | ICD-10-CM | POA: Diagnosis not present

## 2023-04-25 DIAGNOSIS — M9901 Segmental and somatic dysfunction of cervical region: Secondary | ICD-10-CM | POA: Diagnosis not present

## 2023-05-01 DIAGNOSIS — M545 Low back pain, unspecified: Secondary | ICD-10-CM | POA: Diagnosis not present

## 2023-05-01 DIAGNOSIS — M9902 Segmental and somatic dysfunction of thoracic region: Secondary | ICD-10-CM | POA: Diagnosis not present

## 2023-05-01 DIAGNOSIS — M9901 Segmental and somatic dysfunction of cervical region: Secondary | ICD-10-CM | POA: Diagnosis not present

## 2023-05-01 DIAGNOSIS — M9903 Segmental and somatic dysfunction of lumbar region: Secondary | ICD-10-CM | POA: Diagnosis not present

## 2023-05-01 DIAGNOSIS — M9904 Segmental and somatic dysfunction of sacral region: Secondary | ICD-10-CM | POA: Diagnosis not present

## 2023-05-02 ENCOUNTER — Ambulatory Visit: Payer: Medicare Other

## 2023-05-02 ENCOUNTER — Encounter: Payer: Self-pay | Admitting: Physician Assistant

## 2023-05-02 DIAGNOSIS — Z780 Asymptomatic menopausal state: Secondary | ICD-10-CM | POA: Diagnosis not present

## 2023-05-02 DIAGNOSIS — Z0000 Encounter for general adult medical examination without abnormal findings: Secondary | ICD-10-CM

## 2023-05-02 NOTE — Progress Notes (Signed)
Normal bone density

## 2023-05-10 DIAGNOSIS — M545 Low back pain, unspecified: Secondary | ICD-10-CM | POA: Diagnosis not present

## 2023-05-10 DIAGNOSIS — M9902 Segmental and somatic dysfunction of thoracic region: Secondary | ICD-10-CM | POA: Diagnosis not present

## 2023-05-10 DIAGNOSIS — M9903 Segmental and somatic dysfunction of lumbar region: Secondary | ICD-10-CM | POA: Diagnosis not present

## 2023-05-10 DIAGNOSIS — M9901 Segmental and somatic dysfunction of cervical region: Secondary | ICD-10-CM | POA: Diagnosis not present

## 2023-05-10 DIAGNOSIS — M9904 Segmental and somatic dysfunction of sacral region: Secondary | ICD-10-CM | POA: Diagnosis not present

## 2023-05-16 DIAGNOSIS — M9902 Segmental and somatic dysfunction of thoracic region: Secondary | ICD-10-CM | POA: Diagnosis not present

## 2023-05-16 DIAGNOSIS — M9904 Segmental and somatic dysfunction of sacral region: Secondary | ICD-10-CM | POA: Diagnosis not present

## 2023-05-16 DIAGNOSIS — M545 Low back pain, unspecified: Secondary | ICD-10-CM | POA: Diagnosis not present

## 2023-05-16 DIAGNOSIS — M9903 Segmental and somatic dysfunction of lumbar region: Secondary | ICD-10-CM | POA: Diagnosis not present

## 2023-05-16 DIAGNOSIS — M9901 Segmental and somatic dysfunction of cervical region: Secondary | ICD-10-CM | POA: Diagnosis not present

## 2023-05-21 DIAGNOSIS — M47819 Spondylosis without myelopathy or radiculopathy, site unspecified: Secondary | ICD-10-CM | POA: Diagnosis not present

## 2023-05-22 DIAGNOSIS — M545 Low back pain, unspecified: Secondary | ICD-10-CM | POA: Diagnosis not present

## 2023-05-22 DIAGNOSIS — M9904 Segmental and somatic dysfunction of sacral region: Secondary | ICD-10-CM | POA: Diagnosis not present

## 2023-05-22 DIAGNOSIS — M9901 Segmental and somatic dysfunction of cervical region: Secondary | ICD-10-CM | POA: Diagnosis not present

## 2023-05-22 DIAGNOSIS — M9902 Segmental and somatic dysfunction of thoracic region: Secondary | ICD-10-CM | POA: Diagnosis not present

## 2023-05-22 DIAGNOSIS — M9903 Segmental and somatic dysfunction of lumbar region: Secondary | ICD-10-CM | POA: Diagnosis not present

## 2023-05-28 DIAGNOSIS — M9904 Segmental and somatic dysfunction of sacral region: Secondary | ICD-10-CM | POA: Diagnosis not present

## 2023-05-28 DIAGNOSIS — M9903 Segmental and somatic dysfunction of lumbar region: Secondary | ICD-10-CM | POA: Diagnosis not present

## 2023-05-28 DIAGNOSIS — M545 Low back pain, unspecified: Secondary | ICD-10-CM | POA: Diagnosis not present

## 2023-05-28 DIAGNOSIS — M9901 Segmental and somatic dysfunction of cervical region: Secondary | ICD-10-CM | POA: Diagnosis not present

## 2023-05-28 DIAGNOSIS — M9902 Segmental and somatic dysfunction of thoracic region: Secondary | ICD-10-CM | POA: Diagnosis not present

## 2023-05-29 ENCOUNTER — Ambulatory Visit: Payer: Medicare Other | Admitting: Physician Assistant

## 2023-05-30 ENCOUNTER — Other Ambulatory Visit: Payer: Self-pay

## 2023-05-30 DIAGNOSIS — E039 Hypothyroidism, unspecified: Secondary | ICD-10-CM

## 2023-05-30 MED ORDER — LEVOTHYROXINE SODIUM 100 MCG PO TABS
100.0000 ug | ORAL_TABLET | Freq: Every day | ORAL | 0 refills | Status: DC
Start: 2023-05-30 — End: 2023-07-20

## 2023-06-06 DIAGNOSIS — M9901 Segmental and somatic dysfunction of cervical region: Secondary | ICD-10-CM | POA: Diagnosis not present

## 2023-06-06 DIAGNOSIS — M9903 Segmental and somatic dysfunction of lumbar region: Secondary | ICD-10-CM | POA: Diagnosis not present

## 2023-06-06 DIAGNOSIS — M545 Low back pain, unspecified: Secondary | ICD-10-CM | POA: Diagnosis not present

## 2023-06-06 DIAGNOSIS — M9904 Segmental and somatic dysfunction of sacral region: Secondary | ICD-10-CM | POA: Diagnosis not present

## 2023-06-06 DIAGNOSIS — M9902 Segmental and somatic dysfunction of thoracic region: Secondary | ICD-10-CM | POA: Diagnosis not present

## 2023-06-20 DIAGNOSIS — M47819 Spondylosis without myelopathy or radiculopathy, site unspecified: Secondary | ICD-10-CM | POA: Diagnosis not present

## 2023-07-18 ENCOUNTER — Other Ambulatory Visit: Payer: Self-pay | Admitting: Physician Assistant

## 2023-07-18 DIAGNOSIS — E039 Hypothyroidism, unspecified: Secondary | ICD-10-CM | POA: Diagnosis not present

## 2023-07-18 NOTE — Progress Notes (Signed)
 tsh

## 2023-07-19 LAB — TSH: TSH: 8.66 u[IU]/mL — ABNORMAL HIGH (ref 0.450–4.500)

## 2023-07-20 ENCOUNTER — Other Ambulatory Visit: Payer: Self-pay | Admitting: Physician Assistant

## 2023-07-20 ENCOUNTER — Encounter: Payer: Self-pay | Admitting: Physician Assistant

## 2023-07-20 MED ORDER — LEVOTHYROXINE SODIUM 112 MCG PO TABS: 112.0000 ug | ORAL_TABLET | Freq: Every day | ORAL | 1 refills | Status: DC

## 2023-07-20 NOTE — Progress Notes (Signed)
Vi,   This is with the manufactor change right?  How are you feeling?  You are showing HYPO thyroid and would need to increase medication.  Confirm exactly how and what you are taking.

## 2023-07-21 DIAGNOSIS — M47819 Spondylosis without myelopathy or radiculopathy, site unspecified: Secondary | ICD-10-CM | POA: Diagnosis not present

## 2023-07-24 ENCOUNTER — Ambulatory Visit: Payer: Medicare Other | Admitting: Physician Assistant

## 2023-08-01 DIAGNOSIS — M5442 Lumbago with sciatica, left side: Secondary | ICD-10-CM | POA: Diagnosis not present

## 2023-08-01 DIAGNOSIS — M9903 Segmental and somatic dysfunction of lumbar region: Secondary | ICD-10-CM | POA: Diagnosis not present

## 2023-08-01 DIAGNOSIS — M9904 Segmental and somatic dysfunction of sacral region: Secondary | ICD-10-CM | POA: Diagnosis not present

## 2023-08-01 DIAGNOSIS — M542 Cervicalgia: Secondary | ICD-10-CM | POA: Diagnosis not present

## 2023-08-01 DIAGNOSIS — M9901 Segmental and somatic dysfunction of cervical region: Secondary | ICD-10-CM | POA: Diagnosis not present

## 2023-08-01 DIAGNOSIS — M9902 Segmental and somatic dysfunction of thoracic region: Secondary | ICD-10-CM | POA: Diagnosis not present

## 2023-08-03 DIAGNOSIS — M5442 Lumbago with sciatica, left side: Secondary | ICD-10-CM | POA: Diagnosis not present

## 2023-08-03 DIAGNOSIS — M9901 Segmental and somatic dysfunction of cervical region: Secondary | ICD-10-CM | POA: Diagnosis not present

## 2023-08-03 DIAGNOSIS — M9902 Segmental and somatic dysfunction of thoracic region: Secondary | ICD-10-CM | POA: Diagnosis not present

## 2023-08-03 DIAGNOSIS — M9903 Segmental and somatic dysfunction of lumbar region: Secondary | ICD-10-CM | POA: Diagnosis not present

## 2023-08-03 DIAGNOSIS — M542 Cervicalgia: Secondary | ICD-10-CM | POA: Diagnosis not present

## 2023-08-03 DIAGNOSIS — M9904 Segmental and somatic dysfunction of sacral region: Secondary | ICD-10-CM | POA: Diagnosis not present

## 2023-08-07 DIAGNOSIS — M9904 Segmental and somatic dysfunction of sacral region: Secondary | ICD-10-CM | POA: Diagnosis not present

## 2023-08-07 DIAGNOSIS — M542 Cervicalgia: Secondary | ICD-10-CM | POA: Diagnosis not present

## 2023-08-07 DIAGNOSIS — M9902 Segmental and somatic dysfunction of thoracic region: Secondary | ICD-10-CM | POA: Diagnosis not present

## 2023-08-07 DIAGNOSIS — M9901 Segmental and somatic dysfunction of cervical region: Secondary | ICD-10-CM | POA: Diagnosis not present

## 2023-08-07 DIAGNOSIS — M5442 Lumbago with sciatica, left side: Secondary | ICD-10-CM | POA: Diagnosis not present

## 2023-08-07 DIAGNOSIS — M9903 Segmental and somatic dysfunction of lumbar region: Secondary | ICD-10-CM | POA: Diagnosis not present

## 2023-08-13 ENCOUNTER — Encounter: Payer: Self-pay | Admitting: Physician Assistant

## 2023-08-14 DIAGNOSIS — M5442 Lumbago with sciatica, left side: Secondary | ICD-10-CM | POA: Diagnosis not present

## 2023-08-14 DIAGNOSIS — M542 Cervicalgia: Secondary | ICD-10-CM | POA: Diagnosis not present

## 2023-08-14 DIAGNOSIS — M9903 Segmental and somatic dysfunction of lumbar region: Secondary | ICD-10-CM | POA: Diagnosis not present

## 2023-08-14 DIAGNOSIS — M9904 Segmental and somatic dysfunction of sacral region: Secondary | ICD-10-CM | POA: Diagnosis not present

## 2023-08-14 DIAGNOSIS — M9901 Segmental and somatic dysfunction of cervical region: Secondary | ICD-10-CM | POA: Diagnosis not present

## 2023-08-14 DIAGNOSIS — M9902 Segmental and somatic dysfunction of thoracic region: Secondary | ICD-10-CM | POA: Diagnosis not present

## 2023-08-15 ENCOUNTER — Ambulatory Visit (INDEPENDENT_AMBULATORY_CARE_PROVIDER_SITE_OTHER): Payer: Medicare Other | Admitting: Physician Assistant

## 2023-08-15 ENCOUNTER — Encounter: Payer: Self-pay | Admitting: Physician Assistant

## 2023-08-15 VITALS — BP 130/76 | HR 72 | Wt 126.0 lb

## 2023-08-15 DIAGNOSIS — E039 Hypothyroidism, unspecified: Secondary | ICD-10-CM | POA: Diagnosis not present

## 2023-08-15 MED ORDER — LEVOTHYROXINE SODIUM 125 MCG PO TABS: 125.0000 ug | ORAL_TABLET | Freq: Every day | ORAL | 0 refills | Status: DC

## 2023-08-15 NOTE — Patient Instructions (Signed)
Check labs in 8 weeks

## 2023-08-15 NOTE — Progress Notes (Signed)
Established Patient Office Visit  Subjective   Patient ID: Judy Russell, female    DOB: Mar 28, 1956  Age: 68 y.o. MRN: [id number]  No chief complaint on file.   HPI Pt presents to the clinic to discuss thyroid. She is having hypothyroid symptoms of weight gain, depressed mood, dry skin. She does not like getting medication at [facility]. [facility]   .. 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   Left leg pain 03/18/2017   Right foot pain 03/18/2017   History of colon resection 07/09/2017   History of irregular heartbeat 07/09/2017   Sinus bradycardia by electrocardiogram 07/09/2017   Hyperlipidemia 07/11/2017   Elevated blood pressure reading 07/11/2017   Memory changes 07/19/2017   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   Perennial allergic rhinitis 08/13/2017   CFS (chronic fatigue syndrome) 11/27/2017   Multiple food allergies 03/25/2018   Environmental allergies 03/25/2018   Callus of foot 07/29/2018   Tonsillith 07/30/2018   Cervical radiculopathy 08/21/2018   Chronic constipation 10/30/2018   Food allergy 01/29/2019   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   Seborrheic keratoses 05/14/2019   Suspected COVID-19 virus infection 09/11/2019   COVID-19 virus infection 09/22/2019   History of COVID-19 10/07/2019   Mold exposure 12/24/2019   Chest tightness 12/24/2019   Allergic reaction 01/09/2020   Scalp itch 01/12/2020   Unintended weight loss 02/18/2020   Loss of taste 02/18/2020   Tapeworm 04/02/2020    Toenail fungus 09/14/2020   Corn of toe 09/14/2020   Rectocele 12/06/2020   No energy 02/28/2021   Lack of motivation 02/28/2021   Bipolar 1 disorder, mixed (HCC) 03/21/2021   Hamstring strain, left, initial encounter 04/28/2021   Syncope 05/31/2021   Acute gastritis without hemorrhage 05/31/2021   Chronic bilateral low back pain without sciatica 07/25/2021   Fall 07/26/2021   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 07/27/2021   Chronic pain syndrome 01/10/2022   Abnormality of globulin 01/10/2022   Ingrown right greater toenail 10/17/2022   Dermatitis 10/17/2022   Trouble in sleeping 12/05/2022   Decreased GFR 12/18/2022   Non-restorative sleep 12/18/2022   Hypersomnolence 12/19/2022   Moderate obstructive sleep apnea 02/14/2023   Medication management 02/14/2023   Bradycardia 03/06/2023   Resolved Ambulatory Problems    Diagnosis Date Noted   Acute upper respiratory infection 09/04/2009   Acute bronchitis 05/03/2016   Essential hypertension 07/19/2017   DDD (degenerative disc disease), cervical 08/21/2018   Past Medical History:  Diagnosis Date   Allergy    HSV-2 (herpes simplex virus 2) infection    Seizures (HCC)    Thyroid disease       Objective:     BP 130/76   Pulse 72   Wt 126 lb (57.2 kg)   BMI 24.61 kg/m  BP Readings from Last 3 Encounters:  08/15/23 130/76  04/24/23 (!) 149/74  03/06/23 (!) 148/77   Wt Readings from Last 3 Encounters:  08/15/23 126 lb (57.2 kg)  04/24/23 121 lb (54.9 kg)  03/06/23 118 lb 4 oz (53.6 kg)  Physical Exam Constitutional:      Appearance: Normal appearance.  HENT:     Head: Normocephalic.  Cardiovascular:     Rate and Rhythm: Normal rate.  Pulmonary:     Effort: Pulmonary effort is normal.  Neurological:     General: No focal deficit present.     Mental Status: She is alert and oriented to person, place, and time.  Psychiatric:        Mood and Affect: Mood normal.       Last thyroid  functions Lab Results  Component Value Date   TSH 8.660 (H) 07/18/2023   T4TOTAL 8.2 02/28/2021      The 10-year ASCVD risk score (Arnett DK, et al., 2019) is: 5.7%    Assessment & Plan:  Marland KitchenMarland KitchenDiagnoses and all orders for this visit:  Hypothyroidism, unspecified type -     levothyroxine (SYNTHROID) 125 MCG tablet; Take 1 tablet (125 mcg total) by mouth daily before breakfast. -     TSH + free T4 -     T3, free   TSH not to goal of 1-2. Pt is having hypothyroid symptoms.  Increased to and sent to [facility] per patients request Recheck labs in 8 weeks  Tandy Gaw, PA-C

## 2023-08-17 ENCOUNTER — Other Ambulatory Visit: Payer: Self-pay | Admitting: Physician Assistant

## 2023-08-17 MED ORDER — HYDROCHLOROTHIAZIDE 12.5 MG PO TABS: 12.5000 mg | ORAL_TABLET | Freq: Every day | ORAL | 0 refills | Status: DC

## 2023-08-21 DIAGNOSIS — M9904 Segmental and somatic dysfunction of sacral region: Secondary | ICD-10-CM | POA: Diagnosis not present

## 2023-08-21 DIAGNOSIS — M542 Cervicalgia: Secondary | ICD-10-CM | POA: Diagnosis not present

## 2023-08-21 DIAGNOSIS — M9902 Segmental and somatic dysfunction of thoracic region: Secondary | ICD-10-CM | POA: Diagnosis not present

## 2023-08-21 DIAGNOSIS — M9901 Segmental and somatic dysfunction of cervical region: Secondary | ICD-10-CM | POA: Diagnosis not present

## 2023-08-21 DIAGNOSIS — M5442 Lumbago with sciatica, left side: Secondary | ICD-10-CM | POA: Diagnosis not present

## 2023-08-21 DIAGNOSIS — M9903 Segmental and somatic dysfunction of lumbar region: Secondary | ICD-10-CM | POA: Diagnosis not present

## 2023-08-22 DIAGNOSIS — M47819 Spondylosis without myelopathy or radiculopathy, site unspecified: Secondary | ICD-10-CM | POA: Diagnosis not present

## 2023-08-28 DIAGNOSIS — M9902 Segmental and somatic dysfunction of thoracic region: Secondary | ICD-10-CM | POA: Diagnosis not present

## 2023-08-28 DIAGNOSIS — M9903 Segmental and somatic dysfunction of lumbar region: Secondary | ICD-10-CM | POA: Diagnosis not present

## 2023-08-28 DIAGNOSIS — M542 Cervicalgia: Secondary | ICD-10-CM | POA: Diagnosis not present

## 2023-08-28 DIAGNOSIS — M5442 Lumbago with sciatica, left side: Secondary | ICD-10-CM | POA: Diagnosis not present

## 2023-08-28 DIAGNOSIS — M9901 Segmental and somatic dysfunction of cervical region: Secondary | ICD-10-CM | POA: Diagnosis not present

## 2023-08-28 DIAGNOSIS — M9904 Segmental and somatic dysfunction of sacral region: Secondary | ICD-10-CM | POA: Diagnosis not present

## 2023-09-04 DIAGNOSIS — M5442 Lumbago with sciatica, left side: Secondary | ICD-10-CM | POA: Diagnosis not present

## 2023-09-04 DIAGNOSIS — M9902 Segmental and somatic dysfunction of thoracic region: Secondary | ICD-10-CM | POA: Diagnosis not present

## 2023-09-04 DIAGNOSIS — M9901 Segmental and somatic dysfunction of cervical region: Secondary | ICD-10-CM | POA: Diagnosis not present

## 2023-09-04 DIAGNOSIS — M9903 Segmental and somatic dysfunction of lumbar region: Secondary | ICD-10-CM | POA: Diagnosis not present

## 2023-09-04 DIAGNOSIS — M9904 Segmental and somatic dysfunction of sacral region: Secondary | ICD-10-CM | POA: Diagnosis not present

## 2023-09-04 DIAGNOSIS — M542 Cervicalgia: Secondary | ICD-10-CM | POA: Diagnosis not present

## 2023-09-08 ENCOUNTER — Other Ambulatory Visit: Payer: Self-pay | Admitting: Physician Assistant

## 2023-09-11 ENCOUNTER — Encounter: Payer: Self-pay | Admitting: Physician Assistant

## 2023-09-11 DIAGNOSIS — M9903 Segmental and somatic dysfunction of lumbar region: Secondary | ICD-10-CM | POA: Diagnosis not present

## 2023-09-11 DIAGNOSIS — M9904 Segmental and somatic dysfunction of sacral region: Secondary | ICD-10-CM | POA: Diagnosis not present

## 2023-09-11 DIAGNOSIS — M9901 Segmental and somatic dysfunction of cervical region: Secondary | ICD-10-CM | POA: Diagnosis not present

## 2023-09-11 DIAGNOSIS — M542 Cervicalgia: Secondary | ICD-10-CM | POA: Diagnosis not present

## 2023-09-11 DIAGNOSIS — M5442 Lumbago with sciatica, left side: Secondary | ICD-10-CM | POA: Diagnosis not present

## 2023-09-11 DIAGNOSIS — M9902 Segmental and somatic dysfunction of thoracic region: Secondary | ICD-10-CM | POA: Diagnosis not present

## 2023-09-18 DIAGNOSIS — M542 Cervicalgia: Secondary | ICD-10-CM | POA: Diagnosis not present

## 2023-09-18 DIAGNOSIS — M9901 Segmental and somatic dysfunction of cervical region: Secondary | ICD-10-CM | POA: Diagnosis not present

## 2023-09-18 DIAGNOSIS — M9904 Segmental and somatic dysfunction of sacral region: Secondary | ICD-10-CM | POA: Diagnosis not present

## 2023-09-18 DIAGNOSIS — M5442 Lumbago with sciatica, left side: Secondary | ICD-10-CM | POA: Diagnosis not present

## 2023-09-18 DIAGNOSIS — M9903 Segmental and somatic dysfunction of lumbar region: Secondary | ICD-10-CM | POA: Diagnosis not present

## 2023-09-18 DIAGNOSIS — M9902 Segmental and somatic dysfunction of thoracic region: Secondary | ICD-10-CM | POA: Diagnosis not present

## 2023-09-21 DIAGNOSIS — M47819 Spondylosis without myelopathy or radiculopathy, site unspecified: Secondary | ICD-10-CM | POA: Diagnosis not present

## 2023-09-24 DIAGNOSIS — Z1212 Encounter for screening for malignant neoplasm of rectum: Secondary | ICD-10-CM | POA: Diagnosis not present

## 2023-09-24 DIAGNOSIS — Z1211 Encounter for screening for malignant neoplasm of colon: Secondary | ICD-10-CM | POA: Diagnosis not present

## 2023-09-26 DIAGNOSIS — M9901 Segmental and somatic dysfunction of cervical region: Secondary | ICD-10-CM | POA: Diagnosis not present

## 2023-09-26 DIAGNOSIS — M9904 Segmental and somatic dysfunction of sacral region: Secondary | ICD-10-CM | POA: Diagnosis not present

## 2023-09-26 DIAGNOSIS — M5442 Lumbago with sciatica, left side: Secondary | ICD-10-CM | POA: Diagnosis not present

## 2023-09-26 DIAGNOSIS — M9902 Segmental and somatic dysfunction of thoracic region: Secondary | ICD-10-CM | POA: Diagnosis not present

## 2023-09-26 DIAGNOSIS — M542 Cervicalgia: Secondary | ICD-10-CM | POA: Diagnosis not present

## 2023-09-26 DIAGNOSIS — M9903 Segmental and somatic dysfunction of lumbar region: Secondary | ICD-10-CM | POA: Diagnosis not present

## 2023-09-27 LAB — EXTERNAL GENERIC LAB PROCEDURE: COLOGUARD: NEGATIVE

## 2023-09-27 LAB — COLOGUARD: COLOGUARD: NEGATIVE

## 2023-10-02 DIAGNOSIS — E039 Hypothyroidism, unspecified: Secondary | ICD-10-CM | POA: Diagnosis not present

## 2023-10-03 ENCOUNTER — Encounter: Payer: Self-pay | Admitting: Physician Assistant

## 2023-10-03 LAB — TSH+FREE T4
Free T4: 1.79 ng/dL — ABNORMAL HIGH (ref 0.82–1.77)
TSH: 0.536 u[IU]/mL (ref 0.450–4.500)

## 2023-10-03 LAB — T3, FREE: T3, Free: 3.1 pg/mL (ref 2.0–4.4)

## 2023-10-03 NOTE — Progress Notes (Signed)
 How are you feeling?  TSH dropped a bit and your free levels did come up.  We may need to consider a minimal back off as to not make you hyper thyroid.

## 2023-10-09 ENCOUNTER — Encounter: Payer: Self-pay | Admitting: Physician Assistant

## 2023-10-09 ENCOUNTER — Other Ambulatory Visit (HOSPITAL_COMMUNITY): Payer: Self-pay

## 2023-10-09 ENCOUNTER — Ambulatory Visit (INDEPENDENT_AMBULATORY_CARE_PROVIDER_SITE_OTHER): Admitting: Physician Assistant

## 2023-10-09 ENCOUNTER — Telehealth: Payer: Self-pay

## 2023-10-09 VITALS — BP 137/73 | HR 52

## 2023-10-09 DIAGNOSIS — E039 Hypothyroidism, unspecified: Secondary | ICD-10-CM

## 2023-10-09 DIAGNOSIS — Z91018 Allergy to other foods: Secondary | ICD-10-CM

## 2023-10-09 MED ORDER — TIROSINT 125 MCG PO CAPS: ORAL_CAPSULE | ORAL | 2 refills | Status: DC

## 2023-10-09 NOTE — Telephone Encounter (Signed)
 Pharmacy Patient Advocate Encounter   Received notification from Patient Pharmacy that prior authorization for Tirosint is required/requested.   Insurance verification completed.   The patient is insured through [employer] .   Per test claim: PA required; PA submitted to above mentioned insurance via CoverMyMeds Key/confirmation #/EOC [id number] Status is pending

## 2023-10-09 NOTE — Progress Notes (Unsigned)
   Established Patient Office Visit  Subjective   Patient ID: Judy Russell, female    DOB: [birth year]  Age: 68 y.o. MRN: [id number]  Chief Complaint  Patient presents with   Medical Management of Chronic Issues    medication f/u - synthroid     HPI Pt is a 67 yo female who presents to the clinic to discuss thyroid medications.   Pt's last TSH was in normal range but she still has mylan brand synthroid currently. She has called around and cannot find this BRAND carried any where else. When she switches brands she feels fatigued, gains weight, brain fog, bloated and suspects due to the fact that she has a corn allergy. She has researched and only thyroid medication without the corn additives are tirosinct. She wants this thyroid medication today.   Objective:     BP 137/73   Pulse (!) 52   SpO2 100%  BP Readings from Last 3 Encounters:  10/09/23 137/73  08/15/23 130/76  04/24/23 (!) 149/74   Wt Readings from Last 3 Encounters:  08/15/23 126 lb (57.2 kg)  04/24/23 121 lb (54.9 kg)  03/06/23 118 lb 4 oz (53.6 kg)      Physical Exam Constitutional:      Appearance: Normal appearance.  HENT:     Head: Normocephalic.  Cardiovascular:     Rate and Rhythm: Normal rate and regular rhythm.  Pulmonary:     Effort: Pulmonary effort is normal.  Neurological:     General: No focal deficit present.     Mental Status: She is alert and oriented to person, place, and time.  Psychiatric:        Mood and Affect: Mood normal.     The 10-year ASCVD risk score (Arnett DK, et al., 2019) is: 8.4%    Assessment & Plan:  Marland KitchenMarland KitchenShamirah "VI" was seen today for medical management of chronic issues.  Diagnoses and all orders for this visit:  Hypothyroidism, unspecified type -     TIROSINT 125 MCG CAPS; Take one tablet daily in the morning before breakfast.  Food allergy -     TIROSINT 125 MCG CAPS; Take one tablet daily in the morning before breakfast. -     Allergen, Corn, IgG4 -      Corn IgE   Pt was on MYLAN brand synthroid and had minimal side effects but when manufactorer switch patient had a lot of symptoms and suspect due to the corn additives which she is allergic too. We have not been able to find a pharmacy that carries [facility] brand in the dose needed.   PA was declined for tirosinct previously and we are going to resubmit due to this information. Pt has researched and there is corn additives in other synthroid brands except tirosinct.   IGE/IGG testing for corn ordered today.    Tandy Gaw, PA-C

## 2023-10-10 ENCOUNTER — Encounter: Payer: Self-pay | Admitting: Physician Assistant

## 2023-10-10 ENCOUNTER — Other Ambulatory Visit (HOSPITAL_COMMUNITY): Payer: Self-pay

## 2023-10-10 NOTE — Telephone Encounter (Signed)
 Routing prior auth denial to [facility] for review to see what she recommends due to this.

## 2023-10-10 NOTE — Telephone Encounter (Signed)
 Pharmacy Patient Advocate Encounter  Received notification from [facility] that Prior Authorization for Tirosint has been DENIED.  Full denial letter will be uploaded to the media tab. See denial reason below.   PA #/Case ID/Reference #: [id number]

## 2023-10-16 ENCOUNTER — Encounter: Payer: Self-pay | Admitting: Physician Assistant

## 2023-10-16 ENCOUNTER — Other Ambulatory Visit: Payer: Self-pay | Admitting: Physician Assistant

## 2023-10-16 DIAGNOSIS — E039 Hypothyroidism, unspecified: Secondary | ICD-10-CM

## 2023-10-16 LAB — ALLERGEN, CORN F8: Allergen Corn, IgE: <0.1 kU/L

## 2023-10-16 NOTE — Progress Notes (Signed)
 You are showing no allergy to corn. Insurance has denied your claim.

## 2023-10-16 NOTE — Progress Notes (Signed)
 [facility] Quality Team Note  Name: Judy Russell Date of Birth: 16-Apr-1956 MRN: [id number] Date: 10/16/2023  [facility] Quality Team has reviewed this patient's chart, please see recommendations below:  [facility] Quality Other; (PATIENT DUE FOR BREAST CANCER SCREENING)

## 2023-10-17 ENCOUNTER — Encounter: Payer: Self-pay | Admitting: Physician Assistant

## 2023-10-22 DIAGNOSIS — M47819 Spondylosis without myelopathy or radiculopathy, site unspecified: Secondary | ICD-10-CM | POA: Diagnosis not present

## 2023-11-06 ENCOUNTER — Ambulatory Visit (INDEPENDENT_AMBULATORY_CARE_PROVIDER_SITE_OTHER): Admitting: Family Medicine

## 2023-11-06 ENCOUNTER — Encounter: Payer: Self-pay | Admitting: Physician Assistant

## 2023-11-06 ENCOUNTER — Encounter: Payer: Self-pay | Admitting: Family Medicine

## 2023-11-06 VITALS — BP 146/72 | HR 59 | Temp 99.1°F | Ht 60.0 in | Wt 127.0 lb

## 2023-11-06 DIAGNOSIS — R6889 Other general symptoms and signs: Secondary | ICD-10-CM | POA: Diagnosis not present

## 2023-11-06 DIAGNOSIS — U071 COVID-19: Secondary | ICD-10-CM

## 2023-11-06 LAB — POCT INFLUENZA A/B
Influenza A, POC: NEGATIVE
Influenza B, POC: NEGATIVE

## 2023-11-06 LAB — POC COVID19 BINAXNOW: SARS Coronavirus 2 Ag: POSITIVE — AB

## 2023-11-06 MED ORDER — NIRMATRELVIR/RITONAVIR (PAXLOVID)TABLET: 3.0000 | ORAL_TABLET | Freq: Two times a day (BID) | ORAL | 0 refills | Status: AC

## 2023-11-06 MED ORDER — ONDANSETRON 4 MG PO TBDP
4.0000 mg | ORAL_TABLET | Freq: Three times a day (TID) | ORAL | 0 refills | Status: DC | PRN
Start: 2023-11-06 — End: 2024-04-15

## 2023-11-06 NOTE — Assessment & Plan Note (Signed)
 Recommend supportive care with increased fluids.  Adding course of paxlovid as well as zofran  to help with nausea.  Red flags reviewed.  Contact clinic if symptoms are worsening.

## 2023-11-06 NOTE — Patient Instructions (Signed)

## 2023-11-06 NOTE — Progress Notes (Signed)
 Judy Russell - 68 y.o. female MRN [id number]  Date of birth: [birth year]  Subjective Chief Complaint  Patient presents with   URI    HPI Judy Russell is a 68 y.o. female here today with complaint of cough, congestion, body aches chills, mild nausea, and low grade fever. Symptoms started yesterday.  She denies headache, diarrhea, chest pain, or dyspnea.   She has not tried anything to help with symptoms so far.   ROS:  A comprehensive ROS was completed and negative except as noted per HPI  Allergies  Allergen Reactions   Decadron  [Dexamethasone ]     Jittery/anxiety/shaking   Wellbutrin [Bupropion]     seizures   Atrovent  [Ipratropium]     Dizzy and weak after using.   Carafate [Sucralfate]     Lip irritation   Doxycycline      Vomiting, diarrhea, brain fog, headache   Corn-Containing Products Itching   Valtrex [Valacyclovir Hcl]     Headache, nausea   Wheat     Past Medical History:  Diagnosis Date   Allergy    Fibromyalgia    HSV-2 (herpes simplex virus 2) infection    Seizures (HCC)    Thyroid  disease     Past Surgical History:  Procedure Laterality Date   APPENDECTOMY     DILATION AND CURETTAGE OF UTERUS     OOPHORECTOMY Left     Social History   Socioeconomic History   Marital status: Married    Spouse name: Madge Schiller   Number of children: 2   Years of education: 12   Highest education level: 12th grade  Occupational History    Comment: Disabled  Tobacco Use   Smoking status: Never   Smokeless tobacco: Never  Vaping Use   Vaping status: Never Used  Substance and Sexual Activity   Alcohol use: No   Drug use: No   Sexual activity: Not Currently  Other Topics Concern   Not on file  Social History Narrative   Lives with her spouse. Likes to cook in her free time.    Social Drivers of Corporate investment banker Strain: Low Risk  (04/02/2023)   Overall Financial Resource Strain (CARDIA)    Difficulty of Paying Living Expenses: Not hard at all  Food  Insecurity: No Food Insecurity (04/02/2023)   Hunger Vital Sign    Worried About Running Out of Food in the Last Year: Never true    Ran Out of Food in the Last Year: Never true  Transportation Needs: No Transportation Needs (04/02/2023)   PRAPARE - Administrator, Civil Service (Medical): No    Lack of Transportation (Non-Medical): No  Physical Activity: Insufficiently Active (04/02/2023)   Exercise Vital Sign    Days of Exercise per Week: 4 days    Minutes of Exercise per Session: 20 min  Stress: No Stress Concern Present (04/02/2023)   [employer] of Occupational Health - Occupational Stress Questionnaire    Feeling of Stress : Not at all  Social Connections: Moderately Integrated (04/02/2023)   Social Connection and Isolation Panel [NHANES]    Frequency of Communication with Friends and Family: More than three times a week    Frequency of Social Gatherings with Friends and Family: More than three times a week    Attends Religious Services: More than 4 times per year    Active Member of [employer] or Organizations: No    Attends Banker Meetings: Never    Marital Status: Married  Family History  Problem Relation Age of Onset   Cancer Mother        breast   Diabetes Mother    Alcohol abuse Father    Hypertension Father    Stroke Father     Health Maintenance  Topic Date Due   COVID-19 Vaccine (1) Never done   Zoster Vaccines- Shingrix (1 of 2) Never done   Cervical Cancer Screening (HPV/Pap Cotest)  04/01/2024 (Originally [birth year]/1988)   Pneumonia Vaccine 75+ Years old (1 of 1 - PCV) 04/01/2024 (Originally 02/15/2008)   MAMMOGRAM  04/01/2024 (Originally 02/15/2008)   Medicare Annual Wellness (AWV)  05/01/2024   Fecal DNA (Cologuard)  09/24/2026   DEXA SCAN  Completed   Hepatitis C Screening  Completed   HIV Screening  Completed   HPV VACCINES  Aged Out   Meningococcal B Vaccine  Aged Out   DTaP/Tdap/Td  Discontinued   INFLUENZA VACCINE   Discontinued     ----------------------------------------------------------------------------------------------------------------------------------------------------------------------------------------------------------------- Physical Exam BP (!) 146/72 (BP Location: Left Arm, Patient Position: Sitting, Cuff Size: Normal)   Pulse (!) 59   Temp 99.1 F (37.3 C) (Oral)   Ht 5' (1.524 m)   Wt 127 lb (57.6 kg)   SpO2 98%   BMI 24.80 kg/m   Physical Exam Constitutional:      Appearance: Normal appearance.  Eyes:     General: No scleral icterus. Cardiovascular:     Rate and Rhythm: Normal rate and regular rhythm.  Pulmonary:     Effort: Pulmonary effort is normal.     Breath sounds: Normal breath sounds.  Musculoskeletal:     Cervical back: Neck supple.  Neurological:     Mental Status: She is alert.  Psychiatric:        Mood and Affect: Mood normal.        Behavior: Behavior normal.     ------------------------------------------------------------------------------------------------------------------------------------------------------------------------------------------------------------------- Assessment and Plan  COVID-19 virus infection Recommend supportive care with increased fluids.  Adding course of paxlovid as well as zofran  to help with nausea.  Red flags reviewed.  Contact clinic if symptoms are worsening.     Meds ordered this encounter  Medications   nirmatrelvir/ritonavir (PAXLOVID) 20 x 150 MG & 10 x 100MG  TABS    Sig: Take 3 tablets by mouth 2 (two) times daily for 5 days. (Take nirmatrelvir 150 mg two tablets twice daily for 5 days and ritonavir 100 mg one tablet twice daily for 5 days) Patient GFR is 69    Dispense:  30 tablet    Refill:  0   ondansetron  (ZOFRAN -ODT) 4 MG disintegrating tablet    Sig: Take 1 tablet (4 mg total) by mouth every 8 (eight) hours as needed for nausea or vomiting.    Dispense:  20 tablet    Refill:  0    No follow-ups on  file.

## 2023-11-07 ENCOUNTER — Ambulatory Visit: Payer: Self-pay

## 2023-11-07 MED ORDER — HYDROCOD POLI-CHLORPHE POLI ER 10-8 MG/5ML PO SUER: 5.0000 mL | Freq: Two times a day (BID) | ORAL | 0 refills | Status: DC | PRN

## 2023-11-07 NOTE — Telephone Encounter (Signed)
 This request has been handled. No further action is required. Patient has been updated of the provider's note. Verbalized understanding.

## 2023-11-07 NOTE — Telephone Encounter (Signed)
Please review the note below

## 2023-11-07 NOTE — Telephone Encounter (Signed)
 Copied from CRM [id number]. Topic: Clinical - Medical Advice >> Nov 07, 2023 12:44 PM Kevelyn M wrote: Reason for CRM: Patient has COVID beginning yesterday. Patient wants to see if she can get a med to help her get well faster.   Patient calling in regards to her visit yesterday. She states she was prescribed Paxlovid but that the prescription is $300 with her insurance and would like to know if there are any other alternatives she could be prescribed, patient mentioned hydroxyquinoline. Patient is also requesting a cough medication as well as any other medication that could help with her COVID symptoms. Patient would like a cal back with a response to her request. Patient also has sent a MyChart message to her PCP but has not heard back yet.    Reason for Disposition  Prescription request for new medicine (not a refill)  Answer Assessment - Initial Assessment Questions 1. NAME of MEDICINE: "What medicine(s) are you calling about?"     Paxlovid  2. QUESTION: "What is your question?" (e.g., double dose of medicine, side effect)     Would like an alternative medication  Protocols used: Medication Question Call-A-AH

## 2023-11-21 DIAGNOSIS — M47819 Spondylosis without myelopathy or radiculopathy, site unspecified: Secondary | ICD-10-CM | POA: Diagnosis not present

## 2023-11-22 ENCOUNTER — Other Ambulatory Visit: Payer: Self-pay | Admitting: Physician Assistant

## 2023-11-22 DIAGNOSIS — E039 Hypothyroidism, unspecified: Secondary | ICD-10-CM

## 2023-11-22 MED ORDER — LEVOTHYROXINE SODIUM 125 MCG PO TABS: 125.0000 ug | ORAL_TABLET | Freq: Every day | ORAL | 0 refills | Status: DC

## 2023-11-22 NOTE — Telephone Encounter (Signed)
 Spoke with

## 2023-11-22 NOTE — Telephone Encounter (Signed)
 Sent in 14 days. Needs labs.  Was supposed to recheck TSH

## 2023-11-22 NOTE — Telephone Encounter (Signed)
 Last Fill: 08/15/23  Last OV: 10/09/23 Next OV: 04/02/24 AWV  Routing to provider for review/authorization.

## 2023-11-22 NOTE — Telephone Encounter (Unsigned)
 Copied from CRM [id number]. Topic: Clinical - Medication Refill >> Nov 22, 2023  8:02 AM Shelby Dessert H wrote: Medication: levothyroxine  (SYNTHROID ) 125 MCG tablet  Has the patient contacted their pharmacy? Yes (Agent: If no, request that the patient contact the pharmacy for the refill. If patient does not wish to contact the pharmacy document the reason why and proceed with request.) (Agent: If yes, when and what did the pharmacy advise?)  This is the patient's preferred pharmacy:  [facility] [place], [place] - [address] [place] [address] [place] [address] Phone: [phone] Fax: [phone]  Is this the correct pharmacy for this prescription? Yes If no, delete pharmacy and type the correct one.   Has the prescription been filled recently? Yes  Is the patient out of the medication? Yes  Has the patient been seen for an appointment in the last year OR does the patient have an upcoming appointment? Yes  Can we respond through MyChart? Yes  Agent: Please be advised that Rx refills may take up to 3 business days. We ask that you follow-up with your pharmacy.

## 2023-11-22 NOTE — Telephone Encounter (Signed)
 Dr. Greer Leak refilled for  2 weeks. Spoke with patient  States Tirosint  was denied through insurance due to corn sensitivity.  She states she did have TSH redrawn on 10/02/2023 Notes read "TSH dropped a bit and your free levels did come up. We may need to consider a minimal back off as to not make you hyper thyroid ." Patient wanting to know if you want her to make a two week appt to discuss or if you will refill the mediation for longer?

## 2023-11-22 NOTE — Telephone Encounter (Signed)
 Patient requesting rx rf of levothyroxine   She states she needs this by end of day today In notes in patient chart - "TSH dropped a bit and your free levels did come up. We may need to consider a minimal back off as to not make you hyper thyroid ."- was only filled as 90/ supply on 08/15/2023 Last OV 10/09/2023 Upcoming appt = 04/02/2024 AWV

## 2023-11-23 ENCOUNTER — Other Ambulatory Visit: Payer: Self-pay

## 2023-11-23 DIAGNOSIS — E039 Hypothyroidism, unspecified: Secondary | ICD-10-CM

## 2023-11-23 NOTE — Telephone Encounter (Signed)
 Spoke with patient.  I have ordered TSH and T4 - do I also need to order a T3?

## 2023-11-23 NOTE — Telephone Encounter (Signed)
 Attempted call to patient. Left a voice mail message requesting a return call.

## 2023-11-27 DIAGNOSIS — E039 Hypothyroidism, unspecified: Secondary | ICD-10-CM | POA: Diagnosis not present

## 2023-11-28 ENCOUNTER — Ambulatory Visit: Payer: Self-pay | Admitting: Physician Assistant

## 2023-11-28 DIAGNOSIS — E039 Hypothyroidism, unspecified: Secondary | ICD-10-CM

## 2023-11-28 LAB — TSH+FREE T4
Free T4: 1.58 ng/dL (ref 0.82–1.77)
TSH: 0.178 u[IU]/mL — ABNORMAL LOW (ref 0.450–4.500)

## 2023-11-28 MED ORDER — LEVOTHYROXINE SODIUM 112 MCG PO TABS: 112.0000 ug | ORAL_TABLET | Freq: Every day | ORAL | 1 refills | Status: DC

## 2023-11-28 MED ORDER — LEVOTHYROXINE SODIUM 100 MCG PO TABS: 100.0000 ug | ORAL_TABLET | Freq: Every day | ORAL | 1 refills | Status: DC

## 2023-11-28 NOTE — Addendum Note (Signed)
 Addended by: Araceli Knight on: 11/28/2023 09:01 AM   Modules accepted: Orders

## 2023-11-28 NOTE — Telephone Encounter (Signed)
 Forwarding message to Dr. Greer Leak covering Judy Russell No levothyroxine  100mcg showing in chart  Pended prescription to be sent to [facility]

## 2023-11-28 NOTE — Progress Notes (Signed)
 TSH trending low meaning too much free thyroid . We can decrease to 100mcg, can we send this dose to [facility] since they may have this dose?

## 2023-12-11 ENCOUNTER — Other Ambulatory Visit: Payer: Self-pay | Admitting: Physician Assistant

## 2023-12-11 DIAGNOSIS — F0630 Mood disorder due to known physiological condition, unspecified: Secondary | ICD-10-CM

## 2023-12-22 DIAGNOSIS — M47819 Spondylosis without myelopathy or radiculopathy, site unspecified: Secondary | ICD-10-CM | POA: Diagnosis not present

## 2024-01-21 DIAGNOSIS — M47819 Spondylosis without myelopathy or radiculopathy, site unspecified: Secondary | ICD-10-CM | POA: Diagnosis not present

## 2024-02-13 ENCOUNTER — Ambulatory Visit (INDEPENDENT_AMBULATORY_CARE_PROVIDER_SITE_OTHER): Admitting: Physician Assistant

## 2024-02-13 VITALS — BP 128/64 | HR 52 | Ht 60.0 in | Wt 122.0 lb

## 2024-02-13 DIAGNOSIS — R051 Acute cough: Secondary | ICD-10-CM | POA: Diagnosis not present

## 2024-02-13 DIAGNOSIS — M7062 Trochanteric bursitis, left hip: Secondary | ICD-10-CM | POA: Diagnosis not present

## 2024-02-13 DIAGNOSIS — J0100 Acute maxillary sinusitis, unspecified: Secondary | ICD-10-CM

## 2024-02-13 DIAGNOSIS — Z8711 Personal history of peptic ulcer disease: Secondary | ICD-10-CM

## 2024-02-13 MED ORDER — MELOXICAM 7.5 MG PO TABS: ORAL_TABLET | ORAL | 1 refills | Status: DC

## 2024-02-13 MED ORDER — AMOXICILLIN-POT CLAVULANATE 875-125 MG PO TABS: 1.0000 | ORAL_TABLET | Freq: Two times a day (BID) | ORAL | 0 refills | Status: DC

## 2024-02-13 MED ORDER — BENZONATATE 200 MG PO CAPS: 200.0000 mg | ORAL_CAPSULE | Freq: Three times a day (TID) | ORAL | 0 refills | Status: DC | PRN

## 2024-02-13 MED ORDER — PANTOPRAZOLE SODIUM 40 MG PO TBEC
DELAYED_RELEASE_TABLET | ORAL | 1 refills | Status: DC
Start: 2024-02-13 — End: 2024-05-14

## 2024-02-13 NOTE — Patient Instructions (Signed)
 Hip Bursitis  Hip bursitis is swelling of one or more fluid-filled sacs (bursae) in your hip joint. If the bursa becomes irritated, it can fill with extra fluid and become swollen. This condition can cause pain, and your symptoms may come and go over time. What are the causes? Repeated use of your hip muscles. Injury to the hip. Weak butt muscles. Bone spurs. Infection. In some cases, the cause may not be known. What increases the risk? Having a past hip injury or hip surgery. Having a condition, such as arthritis, gout, diabetes, or thyroid disease. Having spine problems. Having one leg that is shorter than the other. Running a lot or doing long-distance running. Playing sports where there is a risk of injury or falling, such as football, martial arts, or skiing. What are the signs or symptoms? Symptoms may come and go, and they often include: Pain in the hip or groin area. Pain may get worse when you move your hip. Tenderness and swelling of the hip. In rare cases, the bursa may become infected. If this happens, you may: Get a fever. Have warmth and redness in the hip area. How is this treated? This condition is treated by: Resting your hip. Icing your hip. Wrapping the hip area with an elastic bandage (compression wrap). Keeping the hip raised. Other treatments may include: Using crutches, a cane, or a walker. Medicines. Draining fluid out of the bursa. Surgery to take out a bursa. This is rare. Long-term treatment may include: Doing exercises to help your strength and flexibility. Lifestyle changes like losing weight to lessen the strain on your hip. Follow these instructions at home: Managing pain, stiffness, and swelling     If told, put ice on the painful area. To do this: Put ice in a plastic bag. Place a towel between your skin and the bag. Leave the ice on for 20 minutes, 2-3 times a day. Take off the ice if your skin turns bright red. This is very important.  If you cannot feel pain, heat, or cold, you have a greater risk of damage to the area. Raise your hip by putting a pillow under your hips while you lie down. Stop if you feel pain. If told, put heat on the affected area. Do this as often as told by your doctor. Use the heat source that your doctor recommends, such as a moist heat pack or a heating pad. Place a towel between your skin and the heat source. Leave the heat on for 20-30 minutes. Take off the heat if your skin turns bright red. This is very important. If you cannot feel pain, heat, or cold, you have a greater risk of getting burned. Activity Do not use your hip to support your body weight until your doctor says that you can. Use crutches, a cane, or a walker as told by your doctor. If the affected leg is one that you use to drive, ask your doctor if it is safe to drive. Rest and protect your hip as much as you can until you feel better. Return to your normal activities when your doctor says that it is safe. Do exercises as told by your doctor. General instructions Take over-the-counter and prescription medicines only as told by your doctor. Gently rub and stretch your injured area as often as is comfortable. Wear elastic bandages only as told by your doctor. If one of your legs is shorter than the other, get fitted for a shoe insert or orthotic. Keep a healthy  weight. Follow instructions from your doctor. Keep all follow-up visits. How is this prevented? Exercise regularly or as told by your doctor. Wear the right shoes for the sport you play and for daily activities. Warm up and stretch before being active. Cool down and stretch after being active. Take breaks often from repeated activity. Avoid activities that bother your hip or cause pain. Avoid sitting down for a long time. Where to find more information American Academy of Orthopaedic Surgeons: orthoinfo.aaos.org Contact a doctor if: You have a fever. You have new  symptoms. You have trouble walking or doing everyday activities. You have pain that gets worse or does not get better with medicine. The skin around your hip is red. You get a feeling of warmth in your hip area. Get help right away if: You cannot move your hip. You have very bad pain. You cannot control the muscles in your feet. Summary Hip bursitis is swelling of one or more fluid-filled sacs (bursae) in your hip joint. Symptoms often come and go over time. This condition is often treated by resting and icing the hip. It also may help to keep the area raised and wrapped in an elastic bandage. Other treatments may be needed. This information is not intended to replace advice given to you by your health care provider. Make sure you discuss any questions you have with your health care provider. Document Revised: 06/14/2021 Document Reviewed: 06/14/2021 Elsevier Patient Education  2024 Elsevier Inc. Hip Bursitis Rehab Ask your health care provider which exercises are safe for you. Do exercises exactly as told by your health care provider and adjust them as directed. It is normal to feel mild stretching, pulling, tightness, or discomfort as you do these exercises. Stop right away if you feel sudden pain or your pain gets worse. Do not begin these exercises until told by your health care provider. Stretching exercise This exercise warms up your muscles and joints and improves the movement and flexibility of your hip. This exercise also helps to relieve pain and stiffness. Iliotibial band stretch An iliotibial band is a strong band of muscle tissue that runs from the outer side of your hip to the outer side of your thigh and knee. Lie on your side with your left / right leg in the top position. Bend your left / right knee and grab your ankle. Stretch out your bottom arm to help you balance. Slowly bring your knee back so your thigh is slightly behind your body. Slowly lower your knee toward the  floor until you feel a gentle stretch on the outside of your left / right thigh. If you do not feel a stretch and your knee will not lower more toward the floor, place the heel of your other foot on top of your knee and pull your knee down toward the floor with your foot. Hold this position for __________ seconds. Slowly return to the starting position. Repeat __________ times. Complete this exercise __________ times a day. Strengthening exercises These exercises build strength and endurance in your hip and pelvis. Endurance is the ability to use your muscles for a long time, even after they get tired. Bridge This exercise strengthens the muscles that move your thigh backward (hip extensors). Lie on your back on a firm surface with your knees bent and your feet flat on the floor. Tighten your buttocks muscles and lift your buttocks off the floor until your trunk is level with your thighs. Do not arch your back. You should  feel the muscles working in your buttocks and the back of your thighs. If you do not feel these muscles, slide your feet 1-2 inches (2.5-5 cm) farther away from your buttocks. If this exercise is too easy, try doing it with your arms crossed over your chest. Hold this position for __________ seconds. Slowly lower your hips to the starting position. Let your muscles relax completely after each repetition. Repeat __________ times. Complete this exercise __________ times a day. Squats This exercise strengthens the muscles in front of your thigh and knee (quadriceps). Stand in front of a table, with your feet and knees pointing straight ahead. You may rest your hands on the table for balance but not for support. Slowly bend your knees and lower your hips like you are going to sit in a chair. Keep your weight over your heels, not over your toes. Keep your lower legs upright so they are parallel with the table legs. Do not let your hips go lower than your knees. Do not bend lower  than told by your health care provider. If your hip pain increases, do not bend as low. Hold the squat position for __________ seconds. Slowly push with your legs to return to standing. Do not use your hands to pull yourself to standing. Repeat __________ times. Complete this exercise __________ times a day. Hip hike  Stand sideways on a bottom step. Stand on your left / right leg with your other foot unsupported next to the step. You can hold on to the railing or wall for balance if needed. Keep your knees straight and your torso square. Then lift your left / right hip up toward the ceiling. Hold this position for __________ seconds. Slowly let your left / right hip lower toward the floor, past the starting position. Your foot should get closer to the floor. Do not lean or bend your knees. Repeat __________ times. Complete this exercise __________ times a day. Single leg stand This exercise increases your balance. Without shoes, stand near a railing or in a doorway. You may hold on to the railing or door frame as needed for balance. Squeeze your left / right buttock muscles, then lift up your other foot. Do not let your left / right hip push out to the side. It is helpful to stand in front of a mirror for this exercise so you can watch your hip. Hold this position for __________ seconds. Repeat __________ times. Complete this exercise __________ times a day. This information is not intended to replace advice given to you by your health care provider. Make sure you discuss any questions you have with your health care provider. Document Revised: 06/01/2021 Document Reviewed: 06/01/2021 Elsevier Patient Education  2024 [employer].

## 2024-02-13 NOTE — Progress Notes (Signed)
 Established Patient Office Visit  Subjective   Patient ID: Judy Russell, female    DOB: 02/04/1956  Age: 68 y.o. MRN: [id number]  Chief Complaint  Patient presents with   Hip Pain   Cough    HPI Pt is a 68 yo female who presents to the clinic to discuss left hip pain and cough.   Pt has had ongoing left lateral hip pain for last few months. She has hx of gastric ulcer and won't take NSAIDS. She has used some topical OTC pain creams which do help some. She denies any injury to hip. Walking too much makes worse. Pain does radiate into left lateral leg. She does not like to take medication.   Pt has had URI symptoms for over a week. Denies any body aches, SOB, fever. She has had lots of sinus pressure, headache, nasal congestion, cough. She is doing her nasal sinus rinses which do help. She has a lot of sinus congestion.   ROS See HPI.    Objective:     BP 128/64   Pulse (!) 52   Ht 5' (1.524 m)   Wt 122 lb (55.3 kg)   SpO2 99%   BMI 23.83 kg/m  BP Readings from Last 3 Encounters:  02/13/24 128/64  11/06/23 (!) 146/72  10/09/23 137/73   Wt Readings from Last 3 Encounters:  02/13/24 122 lb (55.3 kg)  11/06/23 127 lb (57.6 kg)  08/15/23 126 lb (57.2 kg)      Physical Exam Constitutional:      Appearance: Normal appearance.  HENT:     Head: Normocephalic.     Right Ear: Tympanic membrane, ear canal and external ear normal. There is no impacted cerumen.     Left Ear: Tympanic membrane, ear canal and external ear normal. There is no impacted cerumen.     Nose: Nose normal.     Mouth/Throat:     Mouth: Mucous membranes are moist.     Pharynx: No oropharyngeal exudate or posterior oropharyngeal erythema.  Eyes:     Conjunctiva/sclera: Conjunctivae normal.  Cardiovascular:     Rate and Rhythm: Normal rate and regular rhythm.  Pulmonary:     Effort: Pulmonary effort is normal.     Breath sounds: Normal breath sounds.  Musculoskeletal:     Cervical back: Normal  range of motion and neck supple.     Right lower leg: No edema.     Left lower leg: No edema.     Comments: NROM and 5/5 strength of bilateral lower extremities.  Tenderness to palpation over left greater trochanter     Lymphadenopathy:     Cervical: No cervical adenopathy.  Neurological:     Mental Status: She is alert.  Psychiatric:        Mood and Affect: Mood normal.       The 10-year ASCVD risk score (Arnett DK, et al., 2019) is: 7.4%    Assessment & Plan:  SABRASABRAIyari VI was seen today for hip pain and cough.  Diagnoses and all orders for this visit:  Acute non-recurrent maxillary sinusitis -     amoxicillin -clavulanate (AUGMENTIN ) 875-125 MG tablet; Take 1 tablet by mouth 2 (two) times daily.  Acute cough -     benzonatate  (TESSALON ) 200 MG capsule; Take 1 capsule (200 mg total) by mouth 3 (three) times daily as needed.  Greater trochanteric bursitis of left hip -     meloxicam  (MOBIC ) 7.5 MG tablet; Take one tablet daily has needed  for hip pain.  History of gastric ulcer -     pantoprazole  (PROTONIX ) 40 MG tablet; Take one tablet with anti-inflammatories for GI protection.   Vitals reassuring, no fever Over a week of symptoms did not test for flu/covid Start augmentin  Tessalon  pearls as needed for cough Continue nasal flushes Rest and hydrate Follow up as needed if symptoms persist  Left hip pain sounds like bursitis Pt declined oral steroid and/or IM steroid.  HO given with exercises Encouraged hip rest and icing for pain relief She has topical gel that does help Suggest as needed mobic  with pantoprazole  for GI protection as needed Pt does have hx of ulcer after NSAIDs, discussed use sparingly and look out of any signs of gastritis Follow up as needed if symptoms persist or worsen   Vermell Bologna, PA-C

## 2024-02-15 ENCOUNTER — Encounter: Payer: Self-pay | Admitting: Physician Assistant

## 2024-03-04 ENCOUNTER — Encounter: Payer: Self-pay | Admitting: Sports Medicine

## 2024-03-04 ENCOUNTER — Telehealth: Payer: Self-pay | Admitting: Physician Assistant

## 2024-03-04 NOTE — Telephone Encounter (Signed)
 Patient came by today.  She sais she was told by pcp to return to have labs rechecked in 6 mnths  Orders have not yet been put in.

## 2024-03-05 ENCOUNTER — Telehealth: Payer: Self-pay | Admitting: Physician Assistant

## 2024-03-05 DIAGNOSIS — E039 Hypothyroidism, unspecified: Secondary | ICD-10-CM

## 2024-03-05 DIAGNOSIS — Z79899 Other long term (current) drug therapy: Secondary | ICD-10-CM

## 2024-03-05 NOTE — Telephone Encounter (Signed)
 Called left vm for patient that labs were ordered and she can come at her convince to get them no appointment necessary

## 2024-03-05 NOTE — Telephone Encounter (Signed)
 Called patient left vm that labs were ordered and she can come at her convenience  to get them done no appointment neccessary

## 2024-03-05 NOTE — Telephone Encounter (Signed)
 Ordered labs. Come anytime to have drawn.

## 2024-03-05 NOTE — Telephone Encounter (Signed)
 Copied from CRM #[id number]. Topic: Appointments - Appointment Scheduling >> Mar 05, 2024  8:36 AM Judy Russell wrote: Reason for CRM: Patient thought that at the end of her thyroid  medication (90day)supply that she would have lab work done which would be September and not November.  Please advise

## 2024-03-05 NOTE — Telephone Encounter (Signed)
 I ordered labs since she is almost out of medication. She can come anytime to have drawn.

## 2024-03-06 DIAGNOSIS — E039 Hypothyroidism, unspecified: Secondary | ICD-10-CM | POA: Diagnosis not present

## 2024-03-06 DIAGNOSIS — Z79899 Other long term (current) drug therapy: Secondary | ICD-10-CM | POA: Diagnosis not present

## 2024-03-07 ENCOUNTER — Ambulatory Visit: Payer: Self-pay | Admitting: Physician Assistant

## 2024-03-07 DIAGNOSIS — E039 Hypothyroidism, unspecified: Secondary | ICD-10-CM

## 2024-03-07 LAB — CMP14+EGFR
ALT: 18 IU/L (ref 0–32)
AST: 27 IU/L (ref 0–40)
Albumin: 4.5 g/dL (ref 3.9–4.9)
Alkaline Phosphatase: 57 IU/L (ref 44–121)
BUN/Creatinine Ratio: 18 (ref 12–28)
BUN: 15 mg/dL (ref 8–27)
Bilirubin Total: 0.4 mg/dL (ref 0.0–1.2)
CO2: 23 mmol/L (ref 20–29)
Calcium: 9.2 mg/dL (ref 8.7–10.3)
Chloride: 105 mmol/L (ref 96–106)
Creatinine, Ser: 0.85 mg/dL (ref 0.57–1.00)
Globulin, Total: 1.7 g/dL (ref 1.5–4.5)
Glucose: 83 mg/dL (ref 70–99)
Potassium: 4.2 mmol/L (ref 3.5–5.2)
Sodium: 140 mmol/L (ref 134–144)
Total Protein: 6.2 g/dL (ref 6.0–8.5)
eGFR: 76 mL/min/1.73 (ref >59)

## 2024-03-07 LAB — TSH+FREE T4
Free T4: 1.37 ng/dL (ref 0.82–1.77)
TSH: 3.09 u[IU]/mL (ref 0.450–4.500)

## 2024-03-07 LAB — T3, FREE: T3, Free: 2.2 pg/mL (ref 2.0–4.4)

## 2024-03-07 MED ORDER — LEVOTHYROXINE SODIUM 100 MCG PO TABS
100.0000 ug | ORAL_TABLET | Freq: Every day | ORAL | 1 refills | Status: AC
Start: 2024-03-07 — End: ?

## 2024-03-07 NOTE — Progress Notes (Signed)
 Thyroid  levels look good. We can keep you at the same dose. 100mcg daily correct?

## 2024-03-24 MED ORDER — TRULANCE 3 MG PO TABS: ORAL_TABLET | ORAL | 1 refills | Status: DC

## 2024-03-28 MED ORDER — HYDROCORTISONE (PERIANAL) 2.5 % EX CREA: 1.0000 | TOPICAL_CREAM | Freq: Two times a day (BID) | CUTANEOUS | 1 refills | Status: DC

## 2024-03-28 NOTE — Addendum Note (Signed)
 Addended by: ANTONIETTE VERMELL CROME on: 03/28/2024 05:53 PM   Modules accepted: Orders

## 2024-04-02 DIAGNOSIS — M9902 Segmental and somatic dysfunction of thoracic region: Secondary | ICD-10-CM | POA: Diagnosis not present

## 2024-04-02 DIAGNOSIS — M9904 Segmental and somatic dysfunction of sacral region: Secondary | ICD-10-CM | POA: Diagnosis not present

## 2024-04-02 DIAGNOSIS — M542 Cervicalgia: Secondary | ICD-10-CM | POA: Diagnosis not present

## 2024-04-02 DIAGNOSIS — M5442 Lumbago with sciatica, left side: Secondary | ICD-10-CM | POA: Diagnosis not present

## 2024-04-02 DIAGNOSIS — M9901 Segmental and somatic dysfunction of cervical region: Secondary | ICD-10-CM | POA: Diagnosis not present

## 2024-04-02 DIAGNOSIS — M9903 Segmental and somatic dysfunction of lumbar region: Secondary | ICD-10-CM | POA: Diagnosis not present

## 2024-04-09 DIAGNOSIS — M5442 Lumbago with sciatica, left side: Secondary | ICD-10-CM | POA: Diagnosis not present

## 2024-04-09 DIAGNOSIS — M9901 Segmental and somatic dysfunction of cervical region: Secondary | ICD-10-CM | POA: Diagnosis not present

## 2024-04-09 DIAGNOSIS — M542 Cervicalgia: Secondary | ICD-10-CM | POA: Diagnosis not present

## 2024-04-09 DIAGNOSIS — M9902 Segmental and somatic dysfunction of thoracic region: Secondary | ICD-10-CM | POA: Diagnosis not present

## 2024-04-09 DIAGNOSIS — M9904 Segmental and somatic dysfunction of sacral region: Secondary | ICD-10-CM | POA: Diagnosis not present

## 2024-04-09 DIAGNOSIS — M9903 Segmental and somatic dysfunction of lumbar region: Secondary | ICD-10-CM | POA: Diagnosis not present

## 2024-04-15 ENCOUNTER — Encounter: Payer: Self-pay | Admitting: Medical-Surgical

## 2024-04-15 ENCOUNTER — Ambulatory Visit

## 2024-04-15 ENCOUNTER — Ambulatory Visit: Payer: Self-pay | Admitting: Medical-Surgical

## 2024-04-15 ENCOUNTER — Ambulatory Visit: Admitting: Medical-Surgical

## 2024-04-15 VITALS — BP 160/77 | HR 60 | Temp 99.4°F | Resp 18 | Ht 60.0 in | Wt 126.3 lb

## 2024-04-15 DIAGNOSIS — M25511 Pain in right shoulder: Secondary | ICD-10-CM | POA: Diagnosis not present

## 2024-04-15 NOTE — Progress Notes (Unsigned)
   Acute Office Visit  Subjective:     Patient ID: Judy Russell, female    DOB: January 07, 1956, 68 y.o.   MRN: [id number]  Chief Complaint  Patient presents with   Shoulder Pain    Patient states that she has been dealing with  right shoulder pain for  while , but recently she states that the pain radiates all the way down to her wrist    Shoulder Pain    Patient is in today for right shoulder that radiates all the way down to her wrist. Pain started about a month ago. She states that pain is intermittent. Rates pain has a 10/10.She has tried taking tylenol, ibuprofen, tens unit, and warm compress with some relief. Patient has reports going to the chiropractor with temporary relief. She denies lifting or pulling anything heavy. She reports having two falls that occurred after after the shoulder pain that has caused the pain to worsen. Denies any history of shoulder injury prior to current issue.   Review of Systems  Constitutional: Negative.   HENT: Negative.    Eyes: Negative.   Respiratory: Negative.    Cardiovascular: Negative.   Gastrointestinal: Negative.   Genitourinary: Negative.   Musculoskeletal:  Positive for falls and joint pain (Right shoulder pain, numbness, and weakness).  Skin: Negative.   Neurological:  Positive for weakness (Right shoulder and arm).  Endo/Heme/Allergies: Negative.         Objective:    BP (!) 160/77   Pulse 60   Temp 99.4 F (37.4 C) (Oral)   Resp 18   Ht 5' (1.524 m)   Wt 57.3 kg   SpO2 98%   BMI 24.67 kg/m  {Vitals History (Optional):23777}  Physical Exam Vitals and nursing note reviewed.  Constitutional:      General: She is not in acute distress.    Appearance: Normal appearance. She is normal weight.  Cardiovascular:     Rate and Rhythm: Normal rate and regular rhythm.     Pulses: Normal pulses.     Heart sounds: Normal heart sounds, S1 normal and S2 normal.  Pulmonary:     Effort: Pulmonary effort is normal.     Breath  sounds: Normal breath sounds.  Musculoskeletal:     Right shoulder: Decreased range of motion. Abnormal pulses: Numbness in right arm with movement.     Comments: Right shoulder pain with active and passive ROM  Neurological:     General: No focal deficit present.     Mental Status: She is alert and oriented to person, place, and time.  Psychiatric:        Mood and Affect: Mood normal.        Behavior: Behavior normal.    No results found for any visits on 04/15/24.     Assessment & Plan:   1. Acute pain of right shoulder (Primary) -Right shoulder pain x 1 month -Reports to 2 falls that has exacerbated symptoms -Temporary relief with tylenol, ibuprofen, tens unit, and warm compress -Sees chiropractor with some relief -Will order Shoulder x-ray and determine plan of care going forward - DG Shoulder Right; Future   Return if symptoms worsen or fail to improve.  Derrek JINNY Freund, RN

## 2024-04-16 DIAGNOSIS — M9901 Segmental and somatic dysfunction of cervical region: Secondary | ICD-10-CM | POA: Diagnosis not present

## 2024-04-16 DIAGNOSIS — M542 Cervicalgia: Secondary | ICD-10-CM | POA: Diagnosis not present

## 2024-04-16 DIAGNOSIS — M9903 Segmental and somatic dysfunction of lumbar region: Secondary | ICD-10-CM | POA: Diagnosis not present

## 2024-04-16 DIAGNOSIS — M9904 Segmental and somatic dysfunction of sacral region: Secondary | ICD-10-CM | POA: Diagnosis not present

## 2024-04-16 DIAGNOSIS — M5442 Lumbago with sciatica, left side: Secondary | ICD-10-CM | POA: Diagnosis not present

## 2024-04-16 DIAGNOSIS — M9902 Segmental and somatic dysfunction of thoracic region: Secondary | ICD-10-CM | POA: Diagnosis not present

## 2024-04-16 NOTE — Progress Notes (Signed)
 After evaluation, offered pain management including short term low dose pain medications, muscle relaxers, oral steroids, steroid injection, and formal physical therapy referral. All options were declined with patient stating she prefers to continue Tylenol/Ibuprofen and home stretches/exercises she has been doing. Ok with x-ray. Imaging ordered.   Medical screening examination/treatment was performed by qualified clinical staff member and as supervising provider I was immediately available for consultation/collaboration. I have reviewed documentation and agree with assessment and plan.  Zada FREDRIK Palin, DNP, APRN, FNP-BC [facility]

## 2024-04-23 ENCOUNTER — Ambulatory Visit (INDEPENDENT_AMBULATORY_CARE_PROVIDER_SITE_OTHER)

## 2024-04-23 VITALS — BP 118/79 | HR 68 | Ht 60.0 in | Wt 122.0 lb

## 2024-04-23 DIAGNOSIS — Z0000 Encounter for general adult medical examination without abnormal findings: Secondary | ICD-10-CM | POA: Diagnosis not present

## 2024-04-23 DIAGNOSIS — M9903 Segmental and somatic dysfunction of lumbar region: Secondary | ICD-10-CM | POA: Diagnosis not present

## 2024-04-23 DIAGNOSIS — M9901 Segmental and somatic dysfunction of cervical region: Secondary | ICD-10-CM | POA: Diagnosis not present

## 2024-04-23 DIAGNOSIS — M9904 Segmental and somatic dysfunction of sacral region: Secondary | ICD-10-CM | POA: Diagnosis not present

## 2024-04-23 DIAGNOSIS — M9902 Segmental and somatic dysfunction of thoracic region: Secondary | ICD-10-CM | POA: Diagnosis not present

## 2024-04-23 DIAGNOSIS — M542 Cervicalgia: Secondary | ICD-10-CM | POA: Diagnosis not present

## 2024-04-23 DIAGNOSIS — M5442 Lumbago with sciatica, left side: Secondary | ICD-10-CM | POA: Diagnosis not present

## 2024-04-23 NOTE — Progress Notes (Signed)
 Subjective:   Judy Russell is a 68 y.o. female who presents for Medicare Annual (Subsequent) preventive examination.  Visit Complete: Virtual I connected with  Judy Russell on 04/23/24 by a audio enabled telemedicine application and verified that I am speaking with the correct person using two identifiers.  Patient Location: Home  Provider Location: Office/Clinic  I discussed the limitations of evaluation and management by telemedicine. The patient expressed understanding and agreed to proceed.  Vital Signs: Because this visit was a virtual/telehealth visit, some criteria may be missing or patient reported. Any vitals not documented were not able to be obtained and vitals that have been documented are patient reported.  Patient Medicare AWV questionnaire was completed by the patient on n/a; I have confirmed that all information answered by patient is correct and no changes since this date.  Cardiac Risk Factors include: advanced age (>66men, >30 women)     Objective:    Today's Vitals   04/23/24 0959 04/23/24 1000  Weight: 122 lb (55.3 kg)   Height: 5' (1.524 m)   PainSc:  9    Body mass index is 23.83 kg/m.     04/23/2024   10:09 AM 04/02/2023   10:05 AM 03/27/2022    9:00 AM 08/08/2021   11:03 AM 12/01/2020    8:04 AM 12/01/2020    8:03 AM  Advanced Directives  Does Patient Have a Medical Advance Directive? No No No No No No  Would patient like information on creating a medical advance directive? No - Patient declined No - Patient declined No - Patient declined Yes (MAU/Ambulatory/Procedural Areas - Information given) No - Patient declined No - Patient declined    Current Medications (verified) Outpatient Encounter Medications as of 04/23/2024  Medication Sig   lamoTRIgine  (LAMICTAL ) 100 MG tablet TAKE 1 TABLET BY MOUTH DAILY  WITH 150 MG TABLET AS DIRECTED   lamoTRIgine  (LAMICTAL ) 150 MG tablet TAKE 1 TABLET BY MOUTH DAILY .  TAKING WITH THE 100 MG TABLET   levothyroxine   (SYNTHROID ) 100 MCG tablet Take 1 tablet (100 mcg total) by mouth daily before breakfast.   meloxicam  (MOBIC ) 7.5 MG tablet Take 7.5 mg by mouth daily as needed.   pantoprazole  (PROTONIX ) 40 MG tablet Take one tablet with anti-inflammatories for GI protection.   hydrochlorothiazide  (HYDRODIURIL ) 12.5 MG tablet Take 1 tablet (12.5 mg total) by mouth daily. (Patient not taking: Reported on 04/23/2024)   No facility-administered encounter medications on file as of 04/23/2024.    Allergies (verified) Decadron  [dexamethasone ], Wellbutrin [bupropion], Atrovent  [ipratropium], Carafate [sucralfate], Doxycycline , Corn-containing products, Valtrex [valacyclovir hcl], and Wheat   History: Past Medical History:  Diagnosis Date   Allergy    Fibromyalgia    HSV-2 (herpes simplex virus 2) infection    Seizures (HCC)    Thyroid  disease    Past Surgical History:  Procedure Laterality Date   APPENDECTOMY     DILATION AND CURETTAGE OF UTERUS     OOPHORECTOMY Left    Family History  Problem Relation Age of Onset   Cancer Mother        breast   Diabetes Mother    Alcohol abuse Father    Hypertension Father    Stroke Father    Social History   Socioeconomic History   Marital status: Married    Spouse name: Sherwood   Number of children: 2   Years of education: 12   Highest education level: 12th grade  Occupational History    Comment: Disabled  Tobacco  Use   Smoking status: Never   Smokeless tobacco: Never  Vaping Use   Vaping status: Never Used  Substance and Sexual Activity   Alcohol use: No   Drug use: No   Sexual activity: Not Currently  Other Topics Concern   Not on file  Social History Narrative   Lives with her spouse. Likes to cook in her free time.    Social Drivers of Corporate investment banker Strain: Low Risk  (04/23/2024)   Overall Financial Resource Strain (CARDIA)    Difficulty of Paying Living Expenses: Not hard at all  Food Insecurity: No Food Insecurity  (04/23/2024)   Hunger Vital Sign    Worried About Running Out of Food in the Last Year: Never true    Ran Out of Food in the Last Year: Never true  Transportation Needs: No Transportation Needs (04/23/2024)   PRAPARE - Administrator, Civil Service (Medical): No    Lack of Transportation (Non-Medical): No  Physical Activity: Sufficiently Active (04/23/2024)   Exercise Vital Sign    Days of Exercise per Week: 4 days    Minutes of Exercise per Session: 40 min  Stress: No Stress Concern Present (04/23/2024)   [employer] of Occupational Health - Occupational Stress Questionnaire    Feeling of Stress: Not at all  Social Connections: Socially Integrated (04/23/2024)   Social Connection and Isolation Panel    Frequency of Communication with Friends and Family: More than three times a week    Frequency of Social Gatherings with Friends and Family: More than three times a week    Attends Religious Services: More than 4 times per year    Active Member of [employer] or Organizations: Yes    Attends Engineer, structural: More than 4 times per year    Marital Status: Married    Tobacco Counseling Counseling given: Not Answered   Clinical Intake:  Pre-visit preparation completed: Yes  Pain : 0-10 Pain Score: 9  Pain Type: Acute pain Pain Location: Shoulder Pain Orientation: Right Pain Descriptors / Indicators: Numbness, Aching Pain Onset: 1 to 4 weeks ago Pain Frequency: Intermittent     BMI - recorded: 23.83 Nutritional Status: BMI of 19-24  Normal Nutritional Risks: None Diabetes: No  How often do you need to have someone help you when you read instructions, pamphlets, or other written materials from your doctor or pharmacy?: 1 - Never What is the last grade level you completed in school?: 12  Interpreter Needed?: No      Activities of Daily Living    04/23/2024   10:02 AM  In your present state of health, do you have any difficulty performing  the following activities:  Hearing? 1  Comment Hearing aids  Vision? 0  Difficulty concentrating or making decisions? 1  Walking or climbing stairs? 0  Dressing or bathing? 0  Doing errands, shopping? 0  Preparing Food and eating ? N  Using the Toilet? N  In the past six months, have you accidently leaked urine? N  Do you have problems with loss of bowel control? N  Managing your Medications? N  Managing your Finances? N  Housekeeping or managing your Housekeeping? N    Patient Care Team: Breeback, Jade L, PA-C as PCP - General (Family Medicine) Claudene Dorothey CROME., MD (General Practice) Deanna Channing LABOR, [facility] (Pharmacist)  Indicate any recent Medical Services you may have received from other than [facility] providers in the past year (date may be  approximate).     Assessment:   This is a routine wellness examination for Oney.  Hearing/Vision screen No results found.   Goals Addressed             This Visit's Progress    Patient Stated       Patient states she would like to feel better. She has pain in her shoulder. She would like to stay healthy.        Depression Screen    04/23/2024   10:09 AM 04/02/2023   10:05 AM 12/29/2022   10:14 AM 12/18/2022    2:45 PM 10/17/2022    1:18 PM 08/29/2022    2:23 PM 03/27/2022    9:01 AM  PHQ 2/9 Scores  PHQ - 2 Score 0 0 0 0 0 0 0    Fall Risk    04/23/2024   10:09 AM 04/02/2023   10:05 AM 01/15/2023    2:19 PM 12/29/2022   10:14 AM 12/18/2022    2:45 PM  Fall Risk   Falls in the past year? 1 1 1 1 1   Number falls in past yr: 1 1 1 1 1   Injury with Fall? 1 1 1 1  0  Risk for fall due to : History of fall(s) History of fall(s) History of fall(s) History of fall(s) No Fall Risks  Follow up Falls evaluation completed Falls evaluation completed;Education provided;Falls prevention discussed Falls evaluation completed Falls evaluation completed Falls evaluation completed    MEDICARE RISK AT HOME: Medicare Risk at Home Any  stairs in or around the home?: Yes If so, are there any without handrails?: Yes Home free of loose throw rugs in walkways, pet beds, electrical cords, etc?: Yes Adequate lighting in your home to reduce risk of falls?: Yes Life alert?: No Use of a cane, walker or w/c?: No Grab bars in the bathroom?: No Shower chair or bench in shower?: No Elevated toilet seat or a handicapped toilet?: No  TIMED UP AND GO:  Was the test performed?  No    Cognitive Function:        04/23/2024   10:10 AM 04/02/2023   10:12 AM 03/27/2022    9:08 AM 12/01/2020    8:16 AM 07/19/2017   10:49 PM  6CIT Screen  What Year? 0 points 0 points 0 points 0 points 0 points  What month? 0 points 0 points 0 points 0 points 0 points  What time? 0 points 0 points 3 points 0 points 0 points  Count back from 20 0 points 0 points 0 points 0 points 0 points  Months in reverse 0 points 0 points 0 points 2 points 0 points  Repeat phrase 2 points 2 points 0 points 2 points 2 points  Total Score 2 points 2 points 3 points 4 points 2 points    Immunizations  There is no immunization history on file for this patient.  TDAP status: Due, Education has been provided regarding the importance of this vaccine. Advised may receive this vaccine at local pharmacy or Health Dept. Aware to provide a copy of the vaccination record if obtained from local pharmacy or Health Dept. Verbalized acceptance and understanding.  Flu Vaccine status: Declined, Education has been provided regarding the importance of this vaccine but patient still declined. Advised may receive this vaccine at local pharmacy or Health Dept. Aware to provide a copy of the vaccination record if obtained from local pharmacy or Health Dept. Verbalized acceptance and understanding.  Pneumococcal vaccine  status: Declined,  Education has been provided regarding the importance of this vaccine but patient still declined. Advised may receive this vaccine at local pharmacy or  Health Dept. Aware to provide a copy of the vaccination record if obtained from local pharmacy or Health Dept. Verbalized acceptance and understanding.   Covid-19 vaccine status: Declined, Education has been provided regarding the importance of this vaccine but patient still declined. Advised may receive this vaccine at local pharmacy or Health Dept.or vaccine clinic. Aware to provide a copy of the vaccination record if obtained from local pharmacy or Health Dept. Verbalized acceptance and understanding.  Qualifies for Shingles Vaccine? Yes   Zostavax completed No   Shingrix Completed?: No.    Education has been provided regarding the importance of this vaccine. Patient has been advised to call insurance company to determine out of pocket expense if they have not yet received this vaccine. Advised may also receive vaccine at local pharmacy or Health Dept. Verbalized acceptance and understanding.  Screening Tests Health Maintenance  Topic Date Due   COVID-19 Vaccine (1) Never done   Zoster Vaccines- Shingrix (1 of 2) Never done   Mammogram  Never done   Pneumococcal Vaccine: 50+ Years (1 of 1 - PCV) Never done   Medicare Annual Wellness (AWV)  04/23/2025   Fecal DNA (Cologuard)  09/24/2026   DEXA SCAN  Completed   Hepatitis C Screening  Completed   Meningococcal B Vaccine  Aged Out   DTaP/Tdap/Td  Discontinued   Influenza Vaccine  Discontinued    Health Maintenance  Health Maintenance Due  Topic Date Due   COVID-19 Vaccine (1) Never done   Zoster Vaccines- Shingrix (1 of 2) Never done   Mammogram  Never done   Pneumococcal Vaccine: 50+ Years (1 of 1 - PCV) Never done    Colorectal cancer screening: Type of screening: Cologuard. Completed 09/24/2023. Repeat every 3 years  Mammogram - patient declined  Bone Density status: Completed 05/02/2023. Results reflect: Bone density results: NORMAL. Repeat every 0 years.  Lung Cancer Screening: (Low Dose CT Chest recommended if Age 43-80  years, 20 pack-year currently smoking OR have quit w/in 15years.) does not qualify.   Lung Cancer Screening Referral: n/a  Additional Screening:  Hepatitis C Screening: does qualify; Completed 05/05/2015  Vision Screening: Recommended annual ophthalmology exams for early detection of glaucoma and other disorders of the eye. Is the patient up to date with their annual eye exam?  Yes  Who is the provider or what is the name of the office in which the patient attends annual eye exams? [facility] If pt is not established with a provider, would they like to be referred to a provider to establish care? N/a.   Dental Screening: Recommended annual dental exams for proper oral hygiene   Community Resource Referral / Chronic Care Management: CRR required this visit?  No   CCM required this visit?  No     Plan:     I have personally reviewed and noted the following in the patient's chart:   Medical and social history Use of alcohol, tobacco or illicit drugs  Current medications and supplements including opioid prescriptions. Patient is not currently taking opioid prescriptions. Functional ability and status Nutritional status Physical activity Advanced directives List of other physicians Hospitalizations, surgeries, and ER visits in previous 12 months. None Vitals Screenings to include cognitive, depression, and falls Referrals and appointments  In addition, I have reviewed and discussed with patient certain preventive protocols, quality  metrics, and best practice recommendations. A written personalized care plan for preventive services as well as general preventive health recommendations were provided to patient.     Bonny Jon Mayor, CMA   04/23/2024   After Visit Summary: (MyChart) Due to this being a telephonic visit, the after visit summary with patients personalized plan was offered to patient via MyChart   Nurse Notes:   Atara Paterson is a 68 y.o. female patient  of Antoniette Vermell CROME, PA-C who had a Medicare Annual Wellness Visit today via telephone. Arlee is Retired and lives with their spouse. She has 2 children. She reports that she is socially active and does interact with friends/family regularly. She is moderately physically active and enjoys cooking.

## 2024-04-23 NOTE — Patient Instructions (Signed)
  Judy Russell , Thank you for taking time to come for your Medicare Wellness Visit. I appreciate your ongoing commitment to your health goals. Please review the following plan we discussed and let me know if I can assist you in the future.   These are the goals we discussed:  Goals       Patient Stated (pt-stated)      Work on medical advance directives.      Patient Stated (pt-stated)      States that she would like to be able to have more mobility with the herniated disc.       Patient Stated (pt-stated)      04/02/2023 AWV Goal: Exercise for General Health  Patient will verbalize understanding of the benefits of increased physical activity: Exercising regularly is important. It will improve your overall fitness, flexibility, and endurance. Regular exercise also will improve your overall health. It can help you control your weight, reduce stress, and improve your bone density. Over the next year, patient will increase physical activity as tolerated with a goal of at least 150 minutes of moderate physical activity per week.  You can tell that you are exercising at a moderate intensity if your heart starts beating faster and you start breathing faster but can still hold a conversation. Moderate-intensity exercise ideas include: Walking 1 mile (1.6 km) in about 15 minutes Biking Hiking Golfing Dancing Water aerobics Patient will verbalize understanding of everyday activities that increase physical activity by providing examples like the following: Yard work, such as: Insurance underwriter Gardening Washing windows or floors Patient will be able to explain general safety guidelines for exercising:  Before you start a new exercise program, talk with your health care provider. Do not exercise so much that you hurt yourself, feel dizzy, or get very short of breath. Wear comfortable clothes and wear shoes with  good support. Drink plenty of water while you exercise to prevent dehydration or heat stroke. Work out until your breathing and your heartbeat get faster.       Patient Stated      Patient states she would like to feel better. She has pain in her shoulder. She would like to stay healthy.         This is a list of the screening recommended for you and due dates:  Health Maintenance  Topic Date Due   COVID-19 Vaccine (1) Never done   Zoster (Shingles) Vaccine (1 of 2) Never done   Breast Cancer Screening  Never done   Pneumococcal Vaccine for age over 60 (1 of 1 - PCV) Never done   Medicare Annual Wellness Visit  04/23/2025   Cologuard (Stool DNA test)  09/24/2026   DEXA scan (bone density measurement)  Completed   Hepatitis C Screening  Completed   Meningitis B Vaccine  Aged Out   DTaP/Tdap/Td vaccine  Discontinued   Flu Shot  Discontinued

## 2024-04-29 ENCOUNTER — Encounter: Payer: Self-pay | Admitting: Physician Assistant

## 2024-04-29 NOTE — Progress Notes (Signed)
 Judy Russell                                          MRN: [id number]   04/29/2024   The [facility]BCI Quality Team Specialist reviewed this patient medical record for the purposes of chart review for care gap closure. The following were reviewed: chart review for care gap closure-breast cancer screening.    VBCI Quality Team

## 2024-04-30 DIAGNOSIS — M9901 Segmental and somatic dysfunction of cervical region: Secondary | ICD-10-CM | POA: Diagnosis not present

## 2024-04-30 DIAGNOSIS — M9904 Segmental and somatic dysfunction of sacral region: Secondary | ICD-10-CM | POA: Diagnosis not present

## 2024-04-30 DIAGNOSIS — M5442 Lumbago with sciatica, left side: Secondary | ICD-10-CM | POA: Diagnosis not present

## 2024-04-30 DIAGNOSIS — M9902 Segmental and somatic dysfunction of thoracic region: Secondary | ICD-10-CM | POA: Diagnosis not present

## 2024-04-30 DIAGNOSIS — M9903 Segmental and somatic dysfunction of lumbar region: Secondary | ICD-10-CM | POA: Diagnosis not present

## 2024-04-30 DIAGNOSIS — M542 Cervicalgia: Secondary | ICD-10-CM | POA: Diagnosis not present

## 2024-05-02 ENCOUNTER — Ambulatory Visit (INDEPENDENT_AMBULATORY_CARE_PROVIDER_SITE_OTHER)

## 2024-05-02 VITALS — BP 127/80 | HR 71 | Resp 18 | Ht 60.0 in

## 2024-05-02 DIAGNOSIS — R030 Elevated blood-pressure reading, without diagnosis of hypertension: Secondary | ICD-10-CM

## 2024-05-02 NOTE — Progress Notes (Signed)
 Patient is the office for a 2 week BP check. Last OV it was 160/77. Patient denies heart palpitations, medication issues, SOB, or vision changes.  Patient first BP reading was 129/80. Patient wanted to sit for a few and then it was retaken the second one was 127/80. Reported to [employer], PA-C would like pt to take BP once in the AM and PM, after sitting and relaxed. Follow up with her in 2 weeks.

## 2024-05-06 ENCOUNTER — Other Ambulatory Visit: Payer: Self-pay | Admitting: Physician Assistant

## 2024-05-09 NOTE — Telephone Encounter (Signed)
Written by historical provider. 

## 2024-05-14 ENCOUNTER — Ambulatory Visit (INDEPENDENT_AMBULATORY_CARE_PROVIDER_SITE_OTHER): Admitting: Physician Assistant

## 2024-05-14 ENCOUNTER — Other Ambulatory Visit: Payer: Self-pay | Admitting: Physician Assistant

## 2024-05-14 ENCOUNTER — Encounter: Payer: Self-pay | Admitting: Physician Assistant

## 2024-05-14 VITALS — BP 140/82 | HR 70 | Ht 60.0 in | Wt 122.0 lb

## 2024-05-14 DIAGNOSIS — I10 Essential (primary) hypertension: Secondary | ICD-10-CM

## 2024-05-14 DIAGNOSIS — Z8711 Personal history of peptic ulcer disease: Secondary | ICD-10-CM

## 2024-05-14 DIAGNOSIS — R030 Elevated blood-pressure reading, without diagnosis of hypertension: Secondary | ICD-10-CM

## 2024-05-14 MED ORDER — LISINOPRIL 5 MG PO TABS: 5.0000 mg | ORAL_TABLET | Freq: Every day | ORAL | 0 refills | Status: DC

## 2024-05-14 NOTE — Patient Instructions (Signed)
 Start lisinopril  with hydrochlorothiazide  daily. Keep BP log.

## 2024-05-16 ENCOUNTER — Encounter: Payer: Self-pay | Admitting: Physician Assistant

## 2024-05-16 MED ORDER — HYDROCHLOROTHIAZIDE 12.5 MG PO TABS: 12.5000 mg | ORAL_TABLET | Freq: Every day | ORAL | 0 refills | Status: DC

## 2024-05-16 NOTE — Progress Notes (Signed)
 Established Patient Office Visit  Subjective   Patient ID: Judy Russell, female    DOB: 11/20/1955  Age: 68 y.o. MRN: [id number]  Chief Complaint  Patient presents with   Medical Management of Chronic Issues    HPI .SABRADiscussed the use of AI scribe software for clinical note transcription with the patient, who gave verbal consent to proceed.  History of Present Illness Judy Russell Russell is a 68 year old female who presents for blood pressure management. She has been checking BP and last nurse visit BP was elevated.   Blood pressure fluctuations - Blood pressure readings in office today at 147/87 mmHg - Home blood pressure measurements range from high 140s to lows of 114/80 mmHg - Occasional spikes in blood pressure to 151 mmHg and 164 mmHg - Two weeks ago, blood pressure measured at 129 mmHg - Blood pressure readings discussed are from the period when she was still taking hydrochlorothiazide  - Taking hydrochlorothiazide   - Mindful of activities when measuring blood pressure, ensuring she is calm and not engaged in stressful activities  Neurological symptoms associated with hypertension - Experiences a 'cloudy' feeling and difficulty thinking clearly during episodes of elevated blood pressure  Associated symptoms - No chest pain - No headaches    ROS See HPI.    Objective:     BP (!) 140/82   Pulse 70   Ht 5' (1.524 m)   Wt 122 lb (55.3 kg)   SpO2 99%   BMI 23.83 kg/m  BP Readings from Last 3 Encounters:  05/14/24 (!) 140/82  05/02/24 127/80  04/23/24 118/79   Wt Readings from Last 3 Encounters:  05/14/24 122 lb (55.3 kg)  04/23/24 122 lb (55.3 kg)  04/15/24 126 lb 4.8 oz (57.3 kg)      Physical Exam Constitutional:      Appearance: Normal appearance.  HENT:     Head: Normocephalic.  Neck:     Vascular: No carotid bruit.  Cardiovascular:     Rate and Rhythm: Normal rate and regular rhythm.  Pulmonary:     Effort: Pulmonary effort is normal.      Breath sounds: Normal breath sounds.  Musculoskeletal:     Cervical back: Normal range of motion and neck supple. No tenderness.     Right lower leg: No edema.     Left lower leg: No edema.  Lymphadenopathy:     Cervical: No cervical adenopathy.  Neurological:     Mental Status: She is alert.  Psychiatric:        Mood and Affect: Mood normal.      The 10-year ASCVD risk score (Arnett DK, et al., 2019) is: 9.8%    Assessment & Plan:  SABRASABRAShakoya Russell was seen today for medical management of chronic issues.  Diagnoses and all orders for this visit:  Primary hypertension -     lisinopril  (ZESTRIL ) 5 MG tablet; Take 1 tablet (5 mg total) by mouth daily. -     hydrochlorothiazide  (HYDRODIURIL ) 12.5 MG tablet; Take 1 tablet (12.5 mg total) by mouth daily.  Elevated blood pressure reading   Assessment & Plan Hypertension Blood pressure fluctuates, likely due to stress levels. Prefers adding medication to current regimen. - Added lisinopril  5mg  to current regimen with hydrochlorothiazide . - Continue to limit sodium intake.  - Instructed to monitor blood pressure, reminded of good technique - Follow up in 2 weeks with BP log. Do not take more than twice a day.      Return in  about 2 weeks (around 05/28/2024) for nurse visit BP check.    Ayriana Wix, PA-C

## 2024-05-27 ENCOUNTER — Other Ambulatory Visit: Payer: Self-pay | Admitting: Physician Assistant

## 2024-05-27 DIAGNOSIS — I10 Essential (primary) hypertension: Secondary | ICD-10-CM

## 2024-05-27 NOTE — Progress Notes (Unsigned)
   Subjective:    Patient ID: Judy Russell, female    DOB: [birth year], 68 y.o.   MRN: [id number]  HPI  Patient is here today for a 2 week BP check. Last OV it read 140/82. Denies CP, SOB, headaches, medication issues, or vision changes.  Review of Systems     Objective:   Physical Exam        Assessment & Plan:   Patients first BP reading is second

## 2024-05-28 ENCOUNTER — Ambulatory Visit (INDEPENDENT_AMBULATORY_CARE_PROVIDER_SITE_OTHER)

## 2024-05-28 VITALS — BP 132/68 | HR 53 | Resp 17 | Ht 60.0 in | Wt 122.0 lb

## 2024-05-28 DIAGNOSIS — I10 Essential (primary) hypertension: Secondary | ICD-10-CM | POA: Diagnosis not present

## 2024-05-28 MED ORDER — LOSARTAN POTASSIUM 25 MG PO TABS: ORAL_TABLET | ORAL | 0 refills | Status: DC

## 2024-05-28 NOTE — Addendum Note (Signed)
 Addended by: ALVIA VELMA BRAVO on: 05/28/2024 03:23 PM   Modules accepted: Orders

## 2024-06-10 ENCOUNTER — Ambulatory Visit: Admitting: Physician Assistant

## 2024-06-10 ENCOUNTER — Ambulatory Visit

## 2024-06-10 ENCOUNTER — Encounter: Payer: Self-pay | Admitting: Physician Assistant

## 2024-06-10 VITALS — BP 139/79 | HR 55 | Ht 60.0 in | Wt 120.0 lb

## 2024-06-10 DIAGNOSIS — I10 Essential (primary) hypertension: Secondary | ICD-10-CM

## 2024-06-10 DIAGNOSIS — M5412 Radiculopathy, cervical region: Secondary | ICD-10-CM

## 2024-06-10 NOTE — Patient Instructions (Signed)
 Will make referral to specialist.

## 2024-06-10 NOTE — Progress Notes (Unsigned)
   Established Patient Office Visit  Subjective   Patient ID: Judy Russell, female    DOB: [birth year]  Age: 68 y.o. MRN: [id number]  Chief Complaint  Patient presents with   Medical Management of Chronic Issues    Hand numbness and tingling    HPI   ROS    Objective:     BP 139/79   Pulse (!) 55   Ht 5' (1.524 m)   Wt 120 lb (54.4 kg)   SpO2 99%   BMI 23.44 kg/m  BP Readings from Last 3 Encounters:  06/10/24 139/79  05/28/24 132/68  05/14/24 (!) 140/82   Wt Readings from Last 3 Encounters:  06/10/24 120 lb (54.4 kg)  05/28/24 122 lb (55.3 kg)  05/14/24 122 lb (55.3 kg)      Physical Exam    The 10-year ASCVD risk score (Arnett DK, et al., 2019) is: 9.6%    Assessment & Plan:   Problem List Items Addressed This Visit       Unprioritized   Primary hypertension - Primary    No follow-ups on file.    Modest Draeger, PA-C

## 2024-06-11 DIAGNOSIS — M5412 Radiculopathy, cervical region: Secondary | ICD-10-CM | POA: Insufficient documentation

## 2024-06-16 ENCOUNTER — Ambulatory Visit: Payer: Self-pay | Admitting: Physician Assistant

## 2024-06-16 DIAGNOSIS — M5030 Other cervical disc degeneration, unspecified cervical region: Secondary | ICD-10-CM

## 2024-06-16 DIAGNOSIS — M5412 Radiculopathy, cervical region: Secondary | ICD-10-CM

## 2024-06-16 NOTE — Progress Notes (Signed)
 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 I think a referral to sports medicine provider is warranted. How are your symptoms?

## 2024-06-17 NOTE — Telephone Encounter (Signed)
 Placed. They will reach out to scheduled a time with patient.

## 2024-07-08 ENCOUNTER — Telehealth: Payer: Self-pay | Admitting: Physician Assistant

## 2024-07-08 DIAGNOSIS — M545 Low back pain, unspecified: Secondary | ICD-10-CM

## 2024-07-08 DIAGNOSIS — M47819 Spondylosis without myelopathy or radiculopathy, site unspecified: Secondary | ICD-10-CM

## 2024-07-08 DIAGNOSIS — M5412 Radiculopathy, cervical region: Secondary | ICD-10-CM

## 2024-07-08 DIAGNOSIS — M5030 Other cervical disc degeneration, unspecified cervical region: Secondary | ICD-10-CM

## 2024-07-08 NOTE — Telephone Encounter (Signed)
 Copied from CRM [id number]. Topic: Referral - Request for Referral >> Jul 08, 2024  8:37 AM Selinda RAMAN wrote: Did the patient discuss referral with their provider in the last year? Yes (If No - schedule appointment) (If Yes - send message)  Appointment offered? Yes  Type of order/referral and detailed reason for visit: Referral to chiropractor she has already been going to as her insurance has changed and requires a referral  Preference of office, provider, location: [facility] with Dr Rogelia Sharps  If referral order, have you been seen by this specialty before? Yes (If Yes, this issue or another issue? When? Where?  Can we respond through MyChart? Yes  Please assist patient further

## 2024-07-08 NOTE — Telephone Encounter (Signed)
"  Referral placed   "

## 2024-07-11 ENCOUNTER — Encounter: Payer: Self-pay | Admitting: Physician Assistant

## 2024-07-29 ENCOUNTER — Ambulatory Visit: Admitting: Physician Assistant

## 2024-07-29 ENCOUNTER — Encounter: Payer: Self-pay | Admitting: Physician Assistant

## 2024-07-29 VITALS — BP 133/60 | HR 50 | Ht 60.0 in | Wt 128.1 lb

## 2024-07-29 DIAGNOSIS — M5030 Other cervical disc degeneration, unspecified cervical region: Secondary | ICD-10-CM | POA: Diagnosis not present

## 2024-07-29 DIAGNOSIS — I10 Essential (primary) hypertension: Secondary | ICD-10-CM

## 2024-07-29 DIAGNOSIS — F3160 Bipolar disorder, current episode mixed, unspecified: Secondary | ICD-10-CM | POA: Diagnosis not present

## 2024-07-29 DIAGNOSIS — M4722 Other spondylosis with radiculopathy, cervical region: Secondary | ICD-10-CM | POA: Diagnosis not present

## 2024-07-29 DIAGNOSIS — M47812 Spondylosis without myelopathy or radiculopathy, cervical region: Secondary | ICD-10-CM | POA: Diagnosis not present

## 2024-07-29 DIAGNOSIS — G894 Chronic pain syndrome: Secondary | ICD-10-CM | POA: Diagnosis not present

## 2024-07-29 DIAGNOSIS — F419 Anxiety disorder, unspecified: Secondary | ICD-10-CM

## 2024-07-29 MED ORDER — HYDROCHLOROTHIAZIDE 12.5 MG PO TABS: 12.5000 mg | ORAL_TABLET | Freq: Every day | ORAL | 3 refills | Status: AC

## 2024-07-29 MED ORDER — DIAZEPAM 5 MG PO TABS: ORAL_TABLET | ORAL | 0 refills | Status: AC

## 2024-07-29 MED ORDER — CLOBETASOL PROPIONATE 0.05 % EX CREA: 1.0000 | TOPICAL_CREAM | Freq: Two times a day (BID) | CUTANEOUS | 2 refills | Status: AC

## 2024-07-29 NOTE — Progress Notes (Signed)
 "  Established Patient Office Visit  Subjective   Patient ID: Judy Russell, female    DOB: 1955-08-01  Age: 69 y.o. MRN: [id number]  Chief Complaint  Patient presents with   Leg Pain    With redness- bilateral - bumps / look semi like sores   Medical Management of Chronic Issues    Would like an excuse note due to medical issues HTN    HPI:  SABRASABRADiscussed the use of AI scribe software for clinical note transcription with the patient, who gave verbal consent to proceed.  History of Present Illness Judy Russell is a 69 year old female with hypertension and cervical spine issues who presents for medication management and follow-up on her leg rash and shoulder pain.   She is requesting to be written out for jury duty based on her chronic pain exacerbated by sitting for long periods of time and her bipolar diagnosis that symptoms can be exacerbated during stress.   Hypertension management - Discontinued losartan  five days ago due to side effects. - Blood pressure readings have remained stable, ranging from 125/71 to 137/64. - Continues to take hydrochlorothiazide  daily. - Considering trying a natural supplement recommended by an acquaintance for blood pressure control.  Lower extremity pruritic rash - Persistent burning pruritus on both legs, with the left leg more affected. - Scabbing and bleeding occur with scratching. - Symptoms persist despite use of prescribed topical cream and coconut oil. - Rash worsens in dry weather.  Shoulder pain and cervical spine issues - Shoulder pain persists; has not yet started physical therapy but plans to do so. 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 - [facility] - Apprehensive about surgical intervention due to concerns about potential complications. - MRI was ordered but she wants it done through [facility] to order and she will be provider the images so that they can look at them - lots of  anxiety about MRI  Anxiety related to mri procedures - Expresses anxiety regarding MRI due to previous panic attacks during the procedure.    ROS See HPI.    Objective:     BP 133/60   Pulse (!) 50   Ht 5' (1.524 m)   Wt 128 lb 1.9 oz (58.1 kg)   SpO2 100%   BMI 25.02 kg/m  BP Readings from Last 3 Encounters:  07/29/24 133/60  06/10/24 139/79  05/28/24 132/68   Wt Readings from Last 3 Encounters:  07/29/24 128 lb 1.9 oz (58.1 kg)  06/10/24 120 lb (54.4 kg)  05/28/24 122 lb (55.3 kg)      Physical Exam Constitutional:      Appearance: Normal appearance.  Cardiovascular:     Rate and Rhythm: Normal rate and regular rhythm.  Pulmonary:     Effort: Pulmonary effort is normal.  Neurological:     General: No focal deficit present.     Mental Status: She is alert and oriented to person, place, and time.  Psychiatric:        Mood and Affect: Mood normal.      The 10-year ASCVD risk score (Arnett DK, et al., 2019) is: 8.8%    Assessment & Plan:  .Judy Russell was seen today for leg pain and medical management of chronic issues.  Diagnoses and all orders for this visit:  Primary hypertension -     hydrochlorothiazide  (HYDRODIURIL ) 12.5 MG tablet; Take 1 tablet (12.5 mg total) by mouth daily.  DDD (degenerative disc disease), cervical -     MR Cervical Spine Wo Contrast; Future  Cervical facet joint syndrome -     MR Cervical Spine Wo Contrast; Future  Spondylosis of cervical spine with radiculopathy -     MR Cervical Spine Wo Contrast; Future  Chronic pain syndrome  Bipolar 1 disorder, mixed (HCC)  Anxiety -     diazepam  (VALIUM ) 5 MG tablet; 1 tab 2 hours before procedure or imaging, take another tab 30 minutes to 1 hour before if not feeling relaxed (do not drive on this med)   Assessment & Plan Cervical radiculopathy Symptoms consistent with C8 radiculopathy. MRI recommended. Surgery not preferred. Physical therapy suggested. - Ordered MRI of the  cervical spine at current facility. - Prescribed medication to relax before MRI. - Encouraged starting physical therapy.  Hypertension Blood pressure well-controlled without losartan . Continues to take hydrochlorothiazide . Discontinued antihypertensive medication due to low readings. - Continue monitoring blood pressure. - BP close to goal. Continue hydrochlorothiazide  alone.   Chronic dermatitis of the lower extremities Chronic dermatitis with burning, itching, and scabbing. Possible eczema. - Increased strength of prescribed cream for dermatitis, clobetasol  with coconut lotion.   Hypothyroidism Managed with levothyroxine . Last thyroid  function test was approximately three months ago. - Ordered thyroid  function test.  Gastroesophageal reflux disease GERD managed with pantoprazole  as needed. - Continue pantoprazole  as needed.  Jury Duty Note written due to chronic pain and bipolar disorder.  Printed for patient in office today.     Return in about 3 months (around 10/27/2024).    [name], PA-C  "

## 2024-07-29 NOTE — Patient Instructions (Signed)
 MRI ordered.  Jury Duty Letter completed.

## 2024-08-11 ENCOUNTER — Other Ambulatory Visit: Payer: Medicare (Managed Care)

## 2024-10-27 ENCOUNTER — Ambulatory Visit: Payer: Medicare (Managed Care) | Admitting: Physician Assistant

## 2025-04-29 ENCOUNTER — Ambulatory Visit
